# Patient Record
Sex: Male | Born: 1935 | Race: Black or African American | Hispanic: No | Smoking: Former smoker
Health system: Southern US, Community
[De-identification: ages and names within clinical notes are randomized; demographics above are authoritative.]

## PROBLEM LIST (undated history)

## (undated) DIAGNOSIS — Z960 Presence of urogenital implants: Secondary | ICD-10-CM

## (undated) DIAGNOSIS — I219 Acute myocardial infarction, unspecified: Secondary | ICD-10-CM

## (undated) DIAGNOSIS — I10 Essential (primary) hypertension: Secondary | ICD-10-CM

## (undated) DIAGNOSIS — E7800 Pure hypercholesterolemia, unspecified: Secondary | ICD-10-CM

## (undated) DIAGNOSIS — N400 Enlarged prostate without lower urinary tract symptoms: Secondary | ICD-10-CM

## (undated) DIAGNOSIS — H547 Unspecified visual loss: Secondary | ICD-10-CM

## (undated) DIAGNOSIS — I519 Heart disease, unspecified: Secondary | ICD-10-CM

## (undated) DIAGNOSIS — R569 Unspecified convulsions: Secondary | ICD-10-CM

## (undated) DIAGNOSIS — I214 Non-ST elevation (NSTEMI) myocardial infarction: Secondary | ICD-10-CM

## (undated) DIAGNOSIS — J449 Chronic obstructive pulmonary disease, unspecified: Secondary | ICD-10-CM

## (undated) DIAGNOSIS — I209 Angina pectoris, unspecified: Secondary | ICD-10-CM

## (undated) DIAGNOSIS — IMO0001 Reserved for inherently not codable concepts without codable children: Secondary | ICD-10-CM

## (undated) DIAGNOSIS — D649 Anemia, unspecified: Secondary | ICD-10-CM

## (undated) DIAGNOSIS — Z5189 Encounter for other specified aftercare: Secondary | ICD-10-CM

## (undated) DIAGNOSIS — I2510 Atherosclerotic heart disease of native coronary artery without angina pectoris: Secondary | ICD-10-CM

## (undated) HISTORY — DX: Essential (primary) hypertension: I10

## (undated) HISTORY — DX: Anemia, unspecified: D64.9

## (undated) HISTORY — DX: Chronic obstructive pulmonary disease, unspecified: J44.9

## (undated) HISTORY — DX: Unspecified visual loss: H54.7

## (undated) HISTORY — DX: Acute myocardial infarction, unspecified: I21.9

## (undated) HISTORY — PX: CATARACT EXTRACTION: SUR2

## (undated) HISTORY — DX: Non-ST elevation (NSTEMI) myocardial infarction: I21.4

## (undated) HISTORY — PX: TONSILLECTOMY: SUR1361

## (undated) HISTORY — DX: Heart disease, unspecified: I51.9

---

## 1997-10-24 ENCOUNTER — Emergency Department (HOSPITAL_COMMUNITY): Admission: EM | Admit: 1997-10-24 | Discharge: 1997-10-24 | Payer: Self-pay | Admitting: Emergency Medicine

## 1997-11-04 ENCOUNTER — Observation Stay (HOSPITAL_COMMUNITY): Admission: EM | Admit: 1997-11-04 | Discharge: 1997-11-05 | Payer: Self-pay | Admitting: Emergency Medicine

## 2002-10-27 ENCOUNTER — Emergency Department (HOSPITAL_COMMUNITY): Admission: EM | Admit: 2002-10-27 | Discharge: 2002-10-27 | Payer: Self-pay | Admitting: Emergency Medicine

## 2005-04-26 ENCOUNTER — Inpatient Hospital Stay (HOSPITAL_COMMUNITY): Admission: EM | Admit: 2005-04-26 | Discharge: 2005-05-05 | Payer: Self-pay | Admitting: Emergency Medicine

## 2007-08-11 ENCOUNTER — Emergency Department (HOSPITAL_COMMUNITY): Admission: EM | Admit: 2007-08-11 | Discharge: 2007-08-11 | Payer: Self-pay | Admitting: Emergency Medicine

## 2009-03-03 DIAGNOSIS — I219 Acute myocardial infarction, unspecified: Secondary | ICD-10-CM

## 2009-03-03 HISTORY — DX: Acute myocardial infarction, unspecified: I21.9

## 2009-03-03 HISTORY — PX: CORONARY ANGIOPLASTY WITH STENT PLACEMENT: SHX49

## 2009-03-14 ENCOUNTER — Inpatient Hospital Stay (HOSPITAL_COMMUNITY): Admission: EM | Admit: 2009-03-14 | Discharge: 2009-03-19 | Payer: Self-pay | Admitting: Emergency Medicine

## 2009-03-16 ENCOUNTER — Encounter: Payer: Self-pay | Admitting: Cardiology

## 2009-04-16 ENCOUNTER — Emergency Department (HOSPITAL_COMMUNITY): Admission: EM | Admit: 2009-04-16 | Discharge: 2009-04-17 | Payer: Self-pay | Admitting: Emergency Medicine

## 2009-05-23 ENCOUNTER — Emergency Department (HOSPITAL_COMMUNITY): Admission: EM | Admit: 2009-05-23 | Discharge: 2009-05-24 | Payer: Self-pay | Admitting: Emergency Medicine

## 2009-08-28 ENCOUNTER — Emergency Department (HOSPITAL_COMMUNITY): Admission: EM | Admit: 2009-08-28 | Discharge: 2009-08-28 | Payer: Self-pay | Admitting: Emergency Medicine

## 2009-11-22 ENCOUNTER — Ambulatory Visit: Payer: Self-pay | Admitting: Cardiology

## 2010-01-14 ENCOUNTER — Ambulatory Visit: Payer: Self-pay | Admitting: Cardiology

## 2010-02-01 DIAGNOSIS — I214 Non-ST elevation (NSTEMI) myocardial infarction: Secondary | ICD-10-CM

## 2010-02-01 HISTORY — DX: Non-ST elevation (NSTEMI) myocardial infarction: I21.4

## 2010-02-01 HISTORY — PX: CARDIAC CATHETERIZATION: SHX172

## 2010-02-09 ENCOUNTER — Ambulatory Visit: Payer: Self-pay | Admitting: Cardiology

## 2010-02-09 ENCOUNTER — Inpatient Hospital Stay (HOSPITAL_COMMUNITY): Admission: EM | Admit: 2010-02-09 | Discharge: 2010-02-12 | Payer: Self-pay | Admitting: Emergency Medicine

## 2010-02-10 ENCOUNTER — Encounter: Payer: Self-pay | Admitting: Internal Medicine

## 2010-03-02 ENCOUNTER — Ambulatory Visit: Payer: Self-pay | Admitting: Cardiology

## 2010-03-10 ENCOUNTER — Encounter (HOSPITAL_COMMUNITY)
Admission: RE | Admit: 2010-03-10 | Discharge: 2010-05-03 | Payer: Self-pay | Source: Home / Self Care | Attending: Cardiology | Admitting: Cardiology

## 2010-04-06 ENCOUNTER — Ambulatory Visit: Payer: Self-pay | Admitting: Cardiology

## 2010-05-04 ENCOUNTER — Encounter (HOSPITAL_COMMUNITY): Payer: Medicare Other | Attending: Cardiology

## 2010-05-04 DIAGNOSIS — H548 Legal blindness, as defined in USA: Secondary | ICD-10-CM | POA: Insufficient documentation

## 2010-05-04 DIAGNOSIS — I5022 Chronic systolic (congestive) heart failure: Secondary | ICD-10-CM | POA: Insufficient documentation

## 2010-05-04 DIAGNOSIS — Z5189 Encounter for other specified aftercare: Secondary | ICD-10-CM | POA: Insufficient documentation

## 2010-05-04 DIAGNOSIS — I252 Old myocardial infarction: Secondary | ICD-10-CM | POA: Insufficient documentation

## 2010-05-04 DIAGNOSIS — I2510 Atherosclerotic heart disease of native coronary artery without angina pectoris: Secondary | ICD-10-CM | POA: Insufficient documentation

## 2010-05-04 DIAGNOSIS — G40909 Epilepsy, unspecified, not intractable, without status epilepticus: Secondary | ICD-10-CM | POA: Insufficient documentation

## 2010-05-04 DIAGNOSIS — I214 Non-ST elevation (NSTEMI) myocardial infarction: Secondary | ICD-10-CM | POA: Insufficient documentation

## 2010-05-04 DIAGNOSIS — Z7982 Long term (current) use of aspirin: Secondary | ICD-10-CM | POA: Insufficient documentation

## 2010-05-04 DIAGNOSIS — I2589 Other forms of chronic ischemic heart disease: Secondary | ICD-10-CM | POA: Insufficient documentation

## 2010-05-06 ENCOUNTER — Encounter (HOSPITAL_COMMUNITY): Payer: Medicare Other

## 2010-05-09 ENCOUNTER — Encounter (HOSPITAL_COMMUNITY): Payer: Medicare Other

## 2010-05-11 ENCOUNTER — Encounter (HOSPITAL_COMMUNITY): Payer: Medicare Other

## 2010-05-13 ENCOUNTER — Encounter (HOSPITAL_COMMUNITY): Payer: Medicare Other

## 2010-05-16 ENCOUNTER — Encounter (HOSPITAL_COMMUNITY): Payer: Medicare Other

## 2010-05-18 ENCOUNTER — Encounter (HOSPITAL_COMMUNITY): Payer: Medicare Other

## 2010-05-20 ENCOUNTER — Encounter (HOSPITAL_COMMUNITY): Payer: Medicare Other

## 2010-05-23 ENCOUNTER — Encounter (HOSPITAL_COMMUNITY): Payer: Medicare Other

## 2010-05-24 ENCOUNTER — Ambulatory Visit (INDEPENDENT_AMBULATORY_CARE_PROVIDER_SITE_OTHER): Payer: Medicare Other | Admitting: *Deleted

## 2010-05-24 DIAGNOSIS — I2510 Atherosclerotic heart disease of native coronary artery without angina pectoris: Secondary | ICD-10-CM

## 2010-05-24 DIAGNOSIS — Z7901 Long term (current) use of anticoagulants: Secondary | ICD-10-CM

## 2010-05-25 ENCOUNTER — Encounter (HOSPITAL_COMMUNITY): Payer: Medicare Other

## 2010-05-27 ENCOUNTER — Encounter (HOSPITAL_COMMUNITY): Payer: Medicare Other

## 2010-05-30 ENCOUNTER — Encounter (HOSPITAL_COMMUNITY): Payer: Medicare Other

## 2010-06-01 ENCOUNTER — Encounter (HOSPITAL_COMMUNITY): Payer: Medicare Other

## 2010-06-03 ENCOUNTER — Encounter (HOSPITAL_COMMUNITY): Payer: Medicare Other | Attending: Cardiology

## 2010-06-03 DIAGNOSIS — I2510 Atherosclerotic heart disease of native coronary artery without angina pectoris: Secondary | ICD-10-CM | POA: Insufficient documentation

## 2010-06-03 DIAGNOSIS — I252 Old myocardial infarction: Secondary | ICD-10-CM | POA: Insufficient documentation

## 2010-06-03 DIAGNOSIS — I214 Non-ST elevation (NSTEMI) myocardial infarction: Secondary | ICD-10-CM | POA: Insufficient documentation

## 2010-06-03 DIAGNOSIS — Z7982 Long term (current) use of aspirin: Secondary | ICD-10-CM | POA: Insufficient documentation

## 2010-06-03 DIAGNOSIS — I2589 Other forms of chronic ischemic heart disease: Secondary | ICD-10-CM | POA: Insufficient documentation

## 2010-06-03 DIAGNOSIS — Z5189 Encounter for other specified aftercare: Secondary | ICD-10-CM | POA: Insufficient documentation

## 2010-06-03 DIAGNOSIS — G40909 Epilepsy, unspecified, not intractable, without status epilepticus: Secondary | ICD-10-CM | POA: Insufficient documentation

## 2010-06-03 DIAGNOSIS — I5022 Chronic systolic (congestive) heart failure: Secondary | ICD-10-CM | POA: Insufficient documentation

## 2010-06-03 DIAGNOSIS — H548 Legal blindness, as defined in USA: Secondary | ICD-10-CM | POA: Insufficient documentation

## 2010-06-06 ENCOUNTER — Encounter (HOSPITAL_COMMUNITY): Payer: Self-pay | Attending: Cardiology

## 2010-06-06 ENCOUNTER — Encounter (HOSPITAL_COMMUNITY): Payer: Medicare Other

## 2010-06-06 DIAGNOSIS — Z7982 Long term (current) use of aspirin: Secondary | ICD-10-CM | POA: Insufficient documentation

## 2010-06-06 DIAGNOSIS — I214 Non-ST elevation (NSTEMI) myocardial infarction: Secondary | ICD-10-CM | POA: Insufficient documentation

## 2010-06-06 DIAGNOSIS — I5022 Chronic systolic (congestive) heart failure: Secondary | ICD-10-CM | POA: Insufficient documentation

## 2010-06-06 DIAGNOSIS — Z5189 Encounter for other specified aftercare: Secondary | ICD-10-CM | POA: Insufficient documentation

## 2010-06-06 DIAGNOSIS — I2589 Other forms of chronic ischemic heart disease: Secondary | ICD-10-CM | POA: Insufficient documentation

## 2010-06-06 DIAGNOSIS — I2510 Atherosclerotic heart disease of native coronary artery without angina pectoris: Secondary | ICD-10-CM | POA: Insufficient documentation

## 2010-06-06 DIAGNOSIS — I252 Old myocardial infarction: Secondary | ICD-10-CM | POA: Insufficient documentation

## 2010-06-06 DIAGNOSIS — H548 Legal blindness, as defined in USA: Secondary | ICD-10-CM | POA: Insufficient documentation

## 2010-06-06 DIAGNOSIS — G40909 Epilepsy, unspecified, not intractable, without status epilepticus: Secondary | ICD-10-CM | POA: Insufficient documentation

## 2010-06-08 ENCOUNTER — Encounter (HOSPITAL_COMMUNITY): Payer: Self-pay

## 2010-06-08 ENCOUNTER — Encounter (HOSPITAL_COMMUNITY): Payer: Medicare Other

## 2010-06-10 ENCOUNTER — Encounter (HOSPITAL_COMMUNITY): Payer: Medicare Other

## 2010-06-10 ENCOUNTER — Encounter (HOSPITAL_COMMUNITY): Payer: Self-pay

## 2010-06-13 ENCOUNTER — Encounter (HOSPITAL_COMMUNITY): Payer: Medicare Other

## 2010-06-13 ENCOUNTER — Encounter (HOSPITAL_COMMUNITY): Payer: Self-pay

## 2010-06-14 LAB — DIFFERENTIAL
Basophils Absolute: 0.1 10*3/uL (ref 0.0–0.1)
Basophils Relative: 1 % (ref 0–1)
Eosinophils Relative: 3 % (ref 0–5)
Lymphocytes Relative: 13 % (ref 12–46)
Monocytes Absolute: 0.8 10*3/uL (ref 0.1–1.0)

## 2010-06-14 LAB — CBC
HCT: 35.9 % — ABNORMAL LOW (ref 39.0–52.0)
HCT: 37.2 % — ABNORMAL LOW (ref 39.0–52.0)
HCT: 37.4 % — ABNORMAL LOW (ref 39.0–52.0)
Hemoglobin: 12 g/dL — ABNORMAL LOW (ref 13.0–17.0)
Hemoglobin: 12.2 g/dL — ABNORMAL LOW (ref 13.0–17.0)
Hemoglobin: 12.4 g/dL — ABNORMAL LOW (ref 13.0–17.0)
MCH: 28.6 pg (ref 26.0–34.0)
MCH: 29.2 pg (ref 26.0–34.0)
MCH: 29.4 pg (ref 26.0–34.0)
MCHC: 32.8 g/dL (ref 30.0–36.0)
MCV: 87.7 fL (ref 78.0–100.0)
MCV: 88 fL (ref 78.0–100.0)
MCV: 88.2 fL (ref 78.0–100.0)
Platelets: 266 10*3/uL (ref 150–400)
Platelets: 271 10*3/uL (ref 150–400)
RBC: 4.07 MIL/uL — ABNORMAL LOW (ref 4.22–5.81)
RBC: 4.22 MIL/uL (ref 4.22–5.81)
RBC: 4.25 MIL/uL (ref 4.22–5.81)
RBC: 4.27 MIL/uL (ref 4.22–5.81)
RDW: 13.4 % (ref 11.5–15.5)
RDW: 13.5 % (ref 11.5–15.5)
WBC: 10 10*3/uL (ref 4.0–10.5)
WBC: 8.3 10*3/uL (ref 4.0–10.5)

## 2010-06-14 LAB — PROTIME-INR
INR: 1.02 (ref 0.00–1.49)
Prothrombin Time: 13.6 seconds (ref 11.6–15.2)

## 2010-06-14 LAB — CARDIAC PANEL(CRET KIN+CKTOT+MB+TROPI)
CK, MB: 14 ng/mL (ref 0.3–4.0)
Relative Index: 10.1 — ABNORMAL HIGH (ref 0.0–2.5)
Total CK: 138 U/L (ref 7–232)
Total CK: 154 U/L (ref 7–232)
Troponin I: 2.32 ng/mL (ref 0.00–0.06)
Troponin I: 4.11 ng/mL (ref 0.00–0.06)
Troponin I: 5.41 ng/mL (ref 0.00–0.06)

## 2010-06-14 LAB — PHENYTOIN LEVEL, TOTAL: Phenytoin Lvl: <2.5 ug/mL — ABNORMAL LOW (ref 10.0–20.0)

## 2010-06-14 LAB — COMPREHENSIVE METABOLIC PANEL
AST: 26 U/L (ref 0–37)
Albumin: 3.8 g/dL (ref 3.5–5.2)
Alkaline Phosphatase: 66 U/L (ref 39–117)
Alkaline Phosphatase: 72 U/L (ref 39–117)
BUN: 12 mg/dL (ref 6–23)
BUN: 9 mg/dL (ref 6–23)
CO2: 21 mEq/L (ref 19–32)
CO2: 25 mEq/L (ref 19–32)
Chloride: 104 mEq/L (ref 96–112)
Chloride: 111 mEq/L (ref 96–112)
Creatinine, Ser: 1.13 mg/dL (ref 0.4–1.5)
GFR calc Af Amer: >60 mL/min (ref >60)
GFR calc non Af Amer: >60 mL/min (ref >60)
GFR calc non Af Amer: >60 mL/min (ref >60)
Glucose, Bld: 81 mg/dL (ref 70–99)
Potassium: 3.8 mEq/L (ref 3.5–5.1)
Potassium: 4 mEq/L (ref 3.5–5.1)
Total Bilirubin: 0.5 mg/dL (ref 0.3–1.2)
Total Bilirubin: 0.5 mg/dL (ref 0.3–1.2)

## 2010-06-14 LAB — BASIC METABOLIC PANEL
CO2: 25 mEq/L (ref 19–32)
GFR calc Af Amer: >60 mL/min (ref >60)
Glucose, Bld: 126 mg/dL — ABNORMAL HIGH (ref 70–99)
Potassium: 4.5 mEq/L (ref 3.5–5.1)
Sodium: 136 mEq/L (ref 135–145)

## 2010-06-14 LAB — LIPID PANEL: Cholesterol: 139 mg/dL (ref 0–200)

## 2010-06-14 LAB — TROPONIN I: Troponin I: 0.02 ng/mL (ref 0.00–0.06)

## 2010-06-14 LAB — HEPARIN LEVEL (UNFRACTIONATED): Heparin Unfractionated: 0.47 IU/mL (ref 0.30–0.70)

## 2010-06-14 LAB — TSH: TSH: 0.688 u[IU]/mL (ref 0.350–4.500)

## 2010-06-14 LAB — CK TOTAL AND CKMB (NOT AT ARMC): CK, MB: 3.8 ng/mL (ref 0.3–4.0)

## 2010-06-14 LAB — POCT CARDIAC MARKERS

## 2010-06-15 ENCOUNTER — Encounter (HOSPITAL_COMMUNITY): Payer: Self-pay

## 2010-06-15 ENCOUNTER — Encounter (HOSPITAL_COMMUNITY): Payer: Medicare Other

## 2010-06-17 ENCOUNTER — Encounter (HOSPITAL_COMMUNITY): Payer: Self-pay

## 2010-06-17 ENCOUNTER — Encounter (HOSPITAL_COMMUNITY): Payer: Medicare Other

## 2010-06-20 ENCOUNTER — Encounter (HOSPITAL_COMMUNITY): Payer: Medicare Other

## 2010-06-20 ENCOUNTER — Encounter (HOSPITAL_COMMUNITY): Payer: Self-pay

## 2010-06-20 LAB — URINE CULTURE: Colony Count: >=100000

## 2010-06-22 ENCOUNTER — Encounter (HOSPITAL_COMMUNITY): Payer: Medicare Other

## 2010-06-22 ENCOUNTER — Encounter (HOSPITAL_COMMUNITY): Payer: Self-pay

## 2010-06-22 LAB — URINALYSIS, ROUTINE W REFLEX MICROSCOPIC
Ketones, ur: 40 mg/dL — AB
Nitrite: POSITIVE — AB
Urobilinogen, UA: 1 mg/dL (ref 0.0–1.0)
pH: 6.5 (ref 5.0–8.0)

## 2010-06-22 LAB — CBC
Hemoglobin: 10.2 g/dL — ABNORMAL LOW (ref 13.0–17.0)
MCHC: 33.2 g/dL (ref 30.0–36.0)
RBC: 3.32 MIL/uL — ABNORMAL LOW (ref 4.22–5.81)
WBC: 7 10*3/uL (ref 4.0–10.5)

## 2010-06-22 LAB — URINE CULTURE: Colony Count: 75000

## 2010-06-22 LAB — BASIC METABOLIC PANEL
Calcium: 8.8 mg/dL (ref 8.4–10.5)
Creatinine, Ser: 1.05 mg/dL (ref 0.4–1.5)
GFR calc Af Amer: >60 mL/min (ref >60)
GFR calc non Af Amer: >60 mL/min (ref >60)
Sodium: 140 mEq/L (ref 135–145)

## 2010-06-22 LAB — APTT: aPTT: 36 seconds (ref 24–37)

## 2010-06-22 LAB — PROTIME-INR
INR: 1.12 (ref 0.00–1.49)
Prothrombin Time: 14.3 seconds (ref 11.6–15.2)

## 2010-06-22 LAB — URINE MICROSCOPIC-ADD ON

## 2010-06-24 ENCOUNTER — Encounter (HOSPITAL_COMMUNITY): Payer: Medicare Other

## 2010-06-24 ENCOUNTER — Encounter (HOSPITAL_COMMUNITY): Payer: Self-pay

## 2010-06-27 ENCOUNTER — Encounter (HOSPITAL_COMMUNITY): Payer: Self-pay

## 2010-06-27 ENCOUNTER — Encounter (HOSPITAL_COMMUNITY): Payer: Medicare Other

## 2010-06-29 ENCOUNTER — Encounter (HOSPITAL_COMMUNITY): Payer: Self-pay

## 2010-06-29 ENCOUNTER — Encounter (HOSPITAL_COMMUNITY): Payer: Medicare Other

## 2010-07-01 ENCOUNTER — Encounter (HOSPITAL_COMMUNITY): Payer: Medicare Other

## 2010-07-01 ENCOUNTER — Encounter (HOSPITAL_COMMUNITY): Payer: Self-pay

## 2010-07-04 ENCOUNTER — Encounter (HOSPITAL_COMMUNITY): Payer: Medicare Other

## 2010-07-04 ENCOUNTER — Encounter (HOSPITAL_COMMUNITY): Payer: Medicare Other | Attending: Cardiology

## 2010-07-04 DIAGNOSIS — G40909 Epilepsy, unspecified, not intractable, without status epilepticus: Secondary | ICD-10-CM | POA: Insufficient documentation

## 2010-07-04 DIAGNOSIS — Z7982 Long term (current) use of aspirin: Secondary | ICD-10-CM | POA: Insufficient documentation

## 2010-07-04 DIAGNOSIS — I2589 Other forms of chronic ischemic heart disease: Secondary | ICD-10-CM | POA: Insufficient documentation

## 2010-07-04 DIAGNOSIS — I252 Old myocardial infarction: Secondary | ICD-10-CM | POA: Insufficient documentation

## 2010-07-04 DIAGNOSIS — I5022 Chronic systolic (congestive) heart failure: Secondary | ICD-10-CM | POA: Insufficient documentation

## 2010-07-04 DIAGNOSIS — I2510 Atherosclerotic heart disease of native coronary artery without angina pectoris: Secondary | ICD-10-CM | POA: Insufficient documentation

## 2010-07-04 DIAGNOSIS — I214 Non-ST elevation (NSTEMI) myocardial infarction: Secondary | ICD-10-CM | POA: Insufficient documentation

## 2010-07-04 DIAGNOSIS — Z5189 Encounter for other specified aftercare: Secondary | ICD-10-CM | POA: Insufficient documentation

## 2010-07-04 DIAGNOSIS — H548 Legal blindness, as defined in USA: Secondary | ICD-10-CM | POA: Insufficient documentation

## 2010-07-05 LAB — COMPREHENSIVE METABOLIC PANEL
ALT: 15 U/L (ref 0–53)
Albumin: 3.6 g/dL (ref 3.5–5.2)
BUN: 16 mg/dL (ref 6–23)
Calcium: 8.6 mg/dL (ref 8.4–10.5)
Glucose, Bld: 134 mg/dL — ABNORMAL HIGH (ref 70–99)
Potassium: 3.2 mEq/L — ABNORMAL LOW (ref 3.5–5.1)
Sodium: 140 mEq/L (ref 135–145)
Total Protein: 7 g/dL (ref 6.0–8.3)

## 2010-07-05 LAB — LIPID PANEL
HDL: 35 mg/dL — ABNORMAL LOW (ref >39)
Total CHOL/HDL Ratio: 5.9 RATIO
Triglycerides: 80 mg/dL (ref <150)

## 2010-07-05 LAB — BASIC METABOLIC PANEL
BUN: 17 mg/dL (ref 6–23)
BUN: 20 mg/dL (ref 6–23)
BUN: 20 mg/dL (ref 6–23)
CO2: 22 mEq/L (ref 19–32)
CO2: 25 mEq/L (ref 19–32)
Calcium: 8.2 mg/dL — ABNORMAL LOW (ref 8.4–10.5)
Chloride: 103 mEq/L (ref 96–112)
Chloride: 108 mEq/L (ref 96–112)
GFR calc non Af Amer: >60 mL/min (ref >60)
Glucose, Bld: 123 mg/dL — ABNORMAL HIGH (ref 70–99)
Glucose, Bld: 136 mg/dL — ABNORMAL HIGH (ref 70–99)
Glucose, Bld: 93 mg/dL (ref 70–99)
Potassium: 3.6 mEq/L (ref 3.5–5.1)
Potassium: 4.1 mEq/L (ref 3.5–5.1)
Potassium: 4.2 mEq/L (ref 3.5–5.1)
Sodium: 134 mEq/L — ABNORMAL LOW (ref 135–145)

## 2010-07-05 LAB — CBC
HCT: 26.3 % — ABNORMAL LOW (ref 39.0–52.0)
HCT: 26.6 % — ABNORMAL LOW (ref 39.0–52.0)
HCT: 28 % — ABNORMAL LOW (ref 39.0–52.0)
HCT: 28.8 % — ABNORMAL LOW (ref 39.0–52.0)
HCT: 28.9 % — ABNORMAL LOW (ref 39.0–52.0)
HCT: 32.1 % — ABNORMAL LOW (ref 39.0–52.0)
Hemoglobin: 10.6 g/dL — ABNORMAL LOW (ref 13.0–17.0)
Hemoglobin: 12.1 g/dL — ABNORMAL LOW (ref 13.0–17.0)
Hemoglobin: 9.5 g/dL — ABNORMAL LOW (ref 13.0–17.0)
MCHC: 32.7 g/dL (ref 30.0–36.0)
MCHC: 32.8 g/dL (ref 30.0–36.0)
MCHC: 33 g/dL (ref 30.0–36.0)
MCV: 91.9 fL (ref 78.0–100.0)
MCV: 92.4 fL (ref 78.0–100.0)
MCV: 92.8 fL (ref 78.0–100.0)
Platelets: 198 10*3/uL (ref 150–400)
Platelets: 201 10*3/uL (ref 150–400)
Platelets: 204 10*3/uL (ref 150–400)
Platelets: 221 10*3/uL (ref 150–400)
Platelets: 251 10*3/uL (ref 150–400)
RBC: 3.12 MIL/uL — ABNORMAL LOW (ref 4.22–5.81)
RBC: 3.12 MIL/uL — ABNORMAL LOW (ref 4.22–5.81)
RBC: 4.01 MIL/uL — ABNORMAL LOW (ref 4.22–5.81)
RDW: 12.8 % (ref 11.5–15.5)
RDW: 12.9 % (ref 11.5–15.5)
RDW: 12.9 % (ref 11.5–15.5)
RDW: 13.1 % (ref 11.5–15.5)
RDW: 13.1 % (ref 11.5–15.5)
WBC: 15.3 10*3/uL — ABNORMAL HIGH (ref 4.0–10.5)
WBC: 18.2 10*3/uL — ABNORMAL HIGH (ref 4.0–10.5)

## 2010-07-05 LAB — CARDIAC PANEL(CRET KIN+CKTOT+MB+TROPI)
CK, MB: 162.4 ng/mL — ABNORMAL HIGH (ref 0.3–4.0)
Relative Index: 12.6 — ABNORMAL HIGH (ref 0.0–2.5)
Troponin I: >100 ng/mL (ref 0.00–0.06)

## 2010-07-05 LAB — TSH: TSH: 0.951 u[IU]/mL (ref 0.350–4.500)

## 2010-07-05 LAB — DIFFERENTIAL
Basophils Absolute: 0 10*3/uL (ref 0.0–0.1)
Basophils Absolute: 0 10*3/uL (ref 0.0–0.1)
Basophils Relative: 0 % (ref 0–1)
Eosinophils Relative: 0 % (ref 0–5)
Lymphocytes Relative: 5 % — ABNORMAL LOW (ref 12–46)
Lymphs Abs: 1 10*3/uL (ref 0.7–4.0)
Lymphs Abs: 1.9 10*3/uL (ref 0.7–4.0)
Monocytes Absolute: 1 10*3/uL (ref 0.1–1.0)
Monocytes Absolute: 1.5 10*3/uL — ABNORMAL HIGH (ref 0.1–1.0)
Monocytes Absolute: 1.6 10*3/uL — ABNORMAL HIGH (ref 0.1–1.0)
Monocytes Relative: 9 % (ref 3–12)
Neutro Abs: 15.7 10*3/uL — ABNORMAL HIGH (ref 1.7–7.7)
Neutro Abs: 7.8 10*3/uL — ABNORMAL HIGH (ref 1.7–7.7)
Neutrophils Relative %: 70 % (ref 43–77)

## 2010-07-05 LAB — POCT I-STAT, CHEM 8
HCT: 39 % (ref 39.0–52.0)
Hemoglobin: 13.3 g/dL (ref 13.0–17.0)
Potassium: 3.2 mEq/L — ABNORMAL LOW (ref 3.5–5.1)
Sodium: 142 mEq/L (ref 135–145)
TCO2: 26 mmol/L (ref 0–100)

## 2010-07-05 LAB — CK TOTAL AND CKMB (NOT AT ARMC)
CK, MB: 2.1 ng/mL (ref 0.3–4.0)
Relative Index: INVALID (ref 0.0–2.5)
Total CK: 84 U/L (ref 7–232)

## 2010-07-05 LAB — PROTIME-INR
INR: 1.09 (ref 0.00–1.49)
Prothrombin Time: 14 seconds (ref 11.6–15.2)

## 2010-07-05 LAB — POCT CARDIAC MARKERS: Troponin i, poc: <0.05 ng/mL (ref 0.00–0.09)

## 2010-07-06 ENCOUNTER — Encounter (HOSPITAL_COMMUNITY): Payer: Medicare Other

## 2010-07-08 ENCOUNTER — Encounter (HOSPITAL_COMMUNITY): Payer: Medicare Other

## 2010-07-11 ENCOUNTER — Encounter (HOSPITAL_COMMUNITY): Payer: Medicare Other

## 2010-07-13 ENCOUNTER — Encounter (HOSPITAL_COMMUNITY): Payer: Medicare Other

## 2010-07-15 ENCOUNTER — Encounter (HOSPITAL_COMMUNITY): Payer: Medicare Other

## 2010-07-18 ENCOUNTER — Encounter (HOSPITAL_COMMUNITY): Payer: Medicare Other

## 2010-07-20 ENCOUNTER — Encounter (HOSPITAL_COMMUNITY): Payer: Medicare Other

## 2010-07-22 ENCOUNTER — Encounter (HOSPITAL_COMMUNITY): Payer: Medicare Other

## 2010-07-25 ENCOUNTER — Encounter (HOSPITAL_COMMUNITY): Payer: Medicare Other

## 2010-07-27 ENCOUNTER — Encounter (HOSPITAL_COMMUNITY): Payer: Medicare Other

## 2010-07-29 ENCOUNTER — Encounter (HOSPITAL_COMMUNITY): Payer: Medicare Other

## 2010-08-01 ENCOUNTER — Encounter (HOSPITAL_COMMUNITY): Payer: Medicare Other

## 2010-08-03 ENCOUNTER — Encounter (HOSPITAL_COMMUNITY): Payer: PRIVATE HEALTH INSURANCE

## 2010-08-05 ENCOUNTER — Encounter (HOSPITAL_COMMUNITY): Payer: PRIVATE HEALTH INSURANCE

## 2010-08-08 ENCOUNTER — Encounter (HOSPITAL_COMMUNITY): Payer: PRIVATE HEALTH INSURANCE

## 2010-08-10 ENCOUNTER — Encounter (HOSPITAL_COMMUNITY): Payer: PRIVATE HEALTH INSURANCE

## 2010-08-12 ENCOUNTER — Encounter (HOSPITAL_COMMUNITY): Payer: PRIVATE HEALTH INSURANCE

## 2010-08-15 ENCOUNTER — Encounter (HOSPITAL_COMMUNITY): Payer: PRIVATE HEALTH INSURANCE

## 2010-08-17 ENCOUNTER — Encounter (HOSPITAL_COMMUNITY): Payer: PRIVATE HEALTH INSURANCE

## 2010-08-18 ENCOUNTER — Other Ambulatory Visit (HOSPITAL_COMMUNITY): Payer: Self-pay | Admitting: Cardiology

## 2010-08-18 DIAGNOSIS — I2510 Atherosclerotic heart disease of native coronary artery without angina pectoris: Secondary | ICD-10-CM

## 2010-08-19 ENCOUNTER — Encounter (HOSPITAL_COMMUNITY): Payer: PRIVATE HEALTH INSURANCE

## 2010-08-19 NOTE — H&P (Signed)
NAME:  [name], [name] NO.:  [id number]   MEDICAL RECORD NO.:  [id number]          PATIENT TYPE:  EMS   LOCATION:  MAJO                         FACILITY:  [facility]   PHYSICIAN:  Geoffry Paradise, M.D.  DATE OF BIRTH:  1933-02-16   DATE OF ADMISSION:  04/26/2005  DATE OF DISCHARGE:                                HISTORY & PHYSICAL   CHIEF COMPLAINT:  Seizures and altered mental status.   HISTORY OF PRESENT ILLNESS:  [name] is a 77 year old gentleman with a  history of hypertension, evidently not seen by his primary/my partner for at  least one year according to stepdaughter. [name] is presenting at this time with  altered mental status and seizures. Unfortunately, the history is difficult  to ferret out as the stroke service has already seen him, family is not  readily available, [name] has already received significant Ativan and Dilantin.  So needless to say, very little history is obtainable. [name] is agitated now  coming out of the MRI and I am able to piece together some semblance of  history from the neurology note, his pastor and some distant family. [name]  evidently at baseline is legally blind but fairly active and actually does a  fair amount of volunteer work with the [employer] in [place]. According to one of the family members, [name] does smoke fairly heavily  and they are not even clear that [name] is taking his antihypertensive  medication and what that is. Regardless, [name] has been quite healthy and  active at baseline until a sudden neurologic change some time this afternoon  or early evening at which time [name] became confused, bouncing off walls and  developed some left hemibody jerking. [name] was seen by Dr. Pearlean Brownie, stroke  service, immediately; at which time, [name] evidently witnessed some focal left  body seizures which were treated with the Ativan and subsequently Dilantin  loaded by pharmacy protocol. [name] has had no prior CNS history and certainly  had no  illness leading up to this. [name] has had no fever, no rash, no chest  pain or shortness of breath. [name] has had no nausea or vomiting and has had no  evidence of incontinence. At the time of my evaluation, [name] is just coming  back from the MRI and while somewhat refocusable clearly is agitated and  distractible and difficult to comply with history or exam.   PAST MEDICAL HISTORY:  No known drug allergies.   MEDICATIONS:  Blood pressure medications unknown type.   MEDICAL ILLNESSES:  Hypertension for 20 years.   SURGICAL ILLNESSES:  None according to available historians.   SOCIAL HISTORY:  [name] lives in [place], is married. Stepdaughter is the  best historian present. Smokes fairly heavily. Does not drink alcohol.   PHYSICAL EXAMINATION:  VITAL SIGNS:  Temperature 98.0, blood pressure  108/56, pulse 84, respiratory rate 20. O2 saturation 100%.  GENERAL:  The patient is agitated, restless on the bed, spontaneously moving  extremities x4. Seems to vocalize and know his pastor and can converse with  him.  HEENT:  Anicteric.  Bitemporal wasting. No facial droop. Difficult to discern  whether there is a gaze preference or  not because of his current  restlessness and agitation and [name] will not open his eyes or comply with  this. Oropharynx is grossly benign. No obvious blood or trauma.  NECK:  No JVD or bruits.  LUNGS:  Clear bilaterally.  CARDIOVASCULAR:  Distant, obscured by airway sounds and cooperation. Seems  to have a regular rate and rhythm. No obvious murmur.  ABDOMEN:  Soft, flat and nontender. No ascites. No hepatosplenomegaly.  EXTREMITIES:  No clubbing, cyanosis, or edema. Calf soft and nontender.  Intact distal pulses. No mottling.  NEUROLOGICAL:  [name] is lethargic, restless, moves extremities x4, will  converse minimally, will not follow commands for me but [name] has been given  multiple medications at this point.   CT reveals low density lesion right basal ganglia, questionable  infarct  versus tumor. MRI pending.   IMPRESSION:  1.  Left hemibody seizures with altered mental status suspicious for right      brain stroke versus tumor, workup under way. To be treated with      benzodiazepines, Dilantin and support.  2.  Hypertension:  Clearly not an issue at this time. History not clear with      regards to this. Further pending his course.   PLAN:  I have discussed it at length with friends and family that are  present and the workup that will ensue. [name] will be admitted to the intensive  care unit for monitoring of his airway, treatment of seizures, hydration and  further pending his course.           ______________________________  Geoffry Paradise, M.D.     RA/MEDQ  D:  04/26/2005  T:  04/26/2005  Job:  [id number]

## 2010-08-19 NOTE — Discharge Summary (Signed)
[name], [name]               ACCOUNT NO.:  [id number]   MEDICAL RECORD NO.:  [id number]          PATIENT TYPE:  INP   LOCATION:  5522                         FACILITY:  [facility]   PHYSICIAN:  Larina Earthly, M.D.        DATE OF BIRTH:  03/02/1933   DATE OF ADMISSION:  04/26/2005  DATE OF DISCHARGE:  05/05/2005                                 DISCHARGE SUMMARY   DISCHARGE DIAGNOSES:  1.  Uncontrolled hypertension.  2.  Aspiration pneumonia.  3.  Seizure disorder of unclear etiology with MRI being unremarkable.  Seen      and evaluated by the code stroke team with question of malignant      hypertension causing the patient's seizure disorder.  4.  Physician deconditioning.  5.  Decreased visual acuity.  6.  Tobacco abuse with a nicotine addiction.   DISCHARGE MEDICATIONS:  1.  Norvasc 10 mg q.p.m.  2.  Dilantin 100 mg capsules 2 tabs twice daily.  3.  Avelox 400 mg each day x5 days.  4.  Coated aspirin 325 mg each day.  5.  Benicar/HCT 40/25 1 each morning.   Patient is to see Dr. Felipa Eth on May 09, 2005, at which time he will need  review of the echocardiogram obtain in the hospital, Dilantin level, and re-  evaluation of his blood pressure medications and re-evaluation of his  electrolytes and renal parameters.  Patient will also need a followup with  [facility] with Dr. Pearlean Brownie or colleague for his newly  diagnosed seizure disorder.  Patient will also be sent home with home health  with physician and occupational therapies as well as RN to evaluate blood  pressure and work on the patient's physical deconditioning and gait  instability, presumably secondary to lower blood pressure in the normal  range.   Laboratory evaluation on May 03, 2005, sodium 135, potassium 3.4, BUN  10, creatinine 1, calcium 9.1, carbon dioxide 27, white blood cell count  7.9, hemoglobin 13.1, hematocrit 39%, platelet count 255.  Other pertinent  lab work, white blood cell  count was elevated at 16.8 on April 27, 2005.  Admission BUN was 12, creatinine 1.1.  Albumin was 4.1, decreased to 3.3.  Liver function tests all were normal.  Homocysteine level was elevated at  21.4.  CK is 103.  CK-MB 2.4.  Troponin I 0.03.  TSH 0.526.  Vitamin B12  359.  Phenytoin level on May 01, 2005 was 8.2; however, this corrected  to a level of 10.7 with a goal being 10-20 as evaluated by pharmacy  protocol.  Dilantin level on April 27, 2005 was 14.4.  Tox screen was  negative.  Urinalysis on admission was consistent with a UTI; however, urine  culture grew out no growth.   RADIOLOGY:  Patient did have a swallowing study by speech therapy, and he  was deemed appropriate for a regular diet with supervision.   Chest x-ray on April 30, 2005 revealed improved aeration and partial  clearing of the right lower lobe process.   Chest x-ray on April 28, 2005 revealed  COPD with patchy right basilar air  space disease that could represent infection or aspiration.   Head CT without contrast on April 26, 2005 revealed no acute intracranial  findings, mild cerebral atrophy, and chronic microvascular white matter  disease.   MRI with contrast on April 26, 2005 revealed no acute intracranial  abnormality with scattered periventricular white matter changes with  extension into the white matter tracts, surrounding the right greater than  left basal ganglia, nonspecific findings but probable sequelae and chronic  microvascular ischemia.  No acute intracranial abnormality or focal mass.  The lesions serve as a seizure focus.  There was a slight elongation of the  right globe, which can be seen in possible congenital glaucoma.   EKG revealed a normal sinus rhythm with a prolonged Q-T and minimal voltage  criteria for a left ventricular hypertrophy.   Echocardiogram is pending, performed on May 04, 2005.   HISTORY OF PRESENT ILLNESS:  This is a 77 year old with a long  history of  medication noncompliance and hypertension that has been uncontrolled.  Indeed, his last office visit with myself was greater than one year ago.  He  does not have a history of seizures; however, he was found on April 26, 2005 with altered mental status and seizure-like activity and was seen and  evaluated by Dr. Pearlean Brownie of the stroke service on an emergent basis.  They  reviewed his CT scan and MRI and did load him with Dilantin and IV  benzodiazepine.  They also felt that he might have a subacute right brain  infarct; however, this was unclear, based on the CT scan.  Again, my  partner, Dr. Jacky Kindle, was called to admit the patient after the stroke team  evaluation.   HOSPITAL COURSE:  Patient was seen on one occasionally by the neurology  service, who felt that MRI revealed no acute infarct, and they thought that  his new-onset focal seizures were secondary to a right subcortical lesion,  the exact etiology uncertain.  They recommended continuing the Dilantin and  repeating the MRI in approximately two months.   The patient was transferred to the floor.  His mental status quickly  resolved back to his baseline, where he was alert and oriented x3 after IV  Ativan had dissipated in its effect.  The patient did have some low-grade  fevers.  Chest x-ray revealed an aspiration pneumonia.  He was treated with  Avelox, but he never developed a significant cough or respiratory distress.  He clinically improved with respect to his fever.  Speech therapy did see  and evaluate him and saw no overt signs or symptoms of aspiration.  He did  undergone a modified barium swallow, and this was fairly unremarkable, but  they did recommend supervision for the time being with his po intake, and  they discontinued speech therapy services.  Occupational and physical  therapy saw the patient and felt that he did need some followup with home health, given his mild gait instability in the setting  of his visual  abnormalities and elevated blood pressure.   The patient's blood pressure was somewhat very difficult to treat on an  outpatient basis.  He was scheduled to receive Tarka 4/240; however, it  appears that he was clearly noncompliant with this medication, and he did  not receive refills.  On a regular basis, his blood pressure was in excess  of 180-90 systolically as well as 100-110 diastolically.  His medications  were slowly increased  to full dose angiotensin receptor blocker, high dose  calcium channel blocker, and diuretics, so that by the day of discharge, his  blood pressure was in the 120s-140s/70-80s.  He was experiencing some  temporary dizziness on occasion; however, was able to ambulate down the hall  with supervision by myself and without falls and felt comfortable going home  with home health assistance at this time.  We did discuss the importance of blood pressure control and the possible  etiology that microvascular ischemia may have as a result of hypertension,  causing his seizure disorder.  He was in agreement, and he stated that he  does live at home with his wife and stepdaughter, who will be able to assist  in his care.      Larina Earthly, M.D.  Electronically Signed     RA/MEDQ  D:  05/05/2005  T:  05/05/2005  Job:  [id number]   cc:   Pramod P. Pearlean Brownie, MD  Fax: [phone]

## 2010-08-19 NOTE — Consult Note (Signed)
NAME:  [name], [name]               ACCOUNT NO.:  [id number]   MEDICAL RECORD NO.:  [id number]          PATIENT TYPE:  EMS   LOCATION:  MAJO                         FACILITY:  [facility]   PHYSICIAN:  Pramod P. Pearlean Brownie, MD    DATE OF BIRTH:  1932/10/13   DATE OF CONSULTATION:  04/26/2005  DATE OF DISCHARGE:                                   CONSULTATION   REFERRING PHYSICIAN:  Bethann Berkshire, M.D.   REASON FOR REFERRAL:  Code Stroke.   HISTORY OF PRESENT ILLNESS:  [name] is a 77 year old African-American  gentleman who was brought to the emergency room by his family for altered  mental status and some left-sided weakness.  The patient was seen by me  emergently when Code Stroke was called at 1950.  The patient was clearly  having some focal seizures with left gaze deviation and jerking of the eyes.  Admission CT scan was obtained immediately and read by me, showing no acute  right brain pathology.  There was an area of low density in the right basal  ganglia extending into the corona radiata, which was suspicious either for  subacute infarct or possible tumor.  The patient was treated by me in the  emergency room with IV Ativan, initially IM Ativan 1 mg without relief,  subsequently 1 mg IV Ativan was given after an IV line was obtained, with  transient cessation of his seizure activity.  Subsequently seizure activity  recurred and additional 1 mg of Ativan was given with temporary relief.  Orders were given for 20 mg/kg of fosphenytoin.  I met with the patient's  mother as well as stepdaughter, who gave the history that the patient the  patient at about 3:30 to 4 p.m. this evening was found to have some  confusion and altered mental status.  He was trying to go from one room in  the house to the other but was apparently going into the closet instead.  He  has poor vision and he has blindness at baseline, but that may have gotten  worse.  He was also speaking less and was slow to respond  to questions.  He,  in fact, urinated on the floor.  The patient also remained quiet less  responsive during the transit to the hospital.  The patient has no known  prior history of strokes, seizures or significant neurological problems.   PAST MEDICAL HISTORY:  Significant for hypertension, which is poorly  controlled.  His family physician is Larina Earthly, M.D.  He has not seen him  for more than a year.   HOME MEDICATIONS:  Antihypertensives which are unknown at the present time.   SOCIAL HISTORY:  The patient lives at home with the mother and stepdaughter.  He smokes.  He does not drink alcohol.  He does not do drugs.   PAST SURGICAL HISTORY:  None.   MEDICATION ALLERGIES:  None.   REVIEW OF SYSTEMS:  Not significant for any recent fever, cough, chest pain,  shortness of breath, or diarrhea.   PHYSICAL EXAMINATION:  GENERAL:  [name], elderly African-American  gentleman who is in distress because of seizures.  VITAL SIGNS:  He is afebrile.  Pulse rate is 100 per minute.  Blood pressure  is 190/100, respiratory rate 16 per minute.  Distal pulses well-felt.  HEENT:  Head is nontraumatic.  ENT exam unremarkable.  NECK:  Supple without bruit.  CARDIAC:  Regular heart sounds.  LUNGS:  Clear to auscultation.  ABDOMEN:  Soft, nontender.  NEUROLOGIC:  The patient is drowsy.  He opens eyes and follows commands and  tries to speak but speech is quite slow and limited.  He answers his names.  He has left gaze deviation with jerking of the eyes to the left.  There is  intermittent jerking of the left arm and leg.  He moves all four extremities  equally against gravity.  Reflexes are brisk throughout.  Plantars are both  downgoing.  Sensation and coordination cannot be reliably tested.  Gait was  not tested.   CT of the head noncontrast study shows low-density in the right basal  ganglia extending up to the corona radiata with vasogenic edema.  This may  be appear to be a subacute  infarct and though there is some possibility of  underlying tumor, which also needs to be ruled out.  There is no acute  abnormality.  Admission labs are pending at this time.   IMPRESSION:  A 77 year old gentleman with partial-onset seizures with  secondary generalization, etiology probably subacute right brain infarct,  though underlying tumor needs to be ruled out.   PLAN:  Aggressive treatment of seizures with IV Ativan as well as  fosphenytoin 20 mg/kg.  If he develops significant respiratory distress, may  need intubation.  MRI scan of the brain when the patient is stable to  evaluate the right brain lesion better.  Subsequent workup for stroke or  tumor as necessary.  I had a long discussion with the patient's mother as  well as stepdaughter and answered questions and explained the above plan.  The patient will be evaluated through the medical service for blood pressure  control and further medical management.  I will be happy to follow the  patient in consults.   I spent about an hour of critical care time at the patient's bedside,  directing his care as well as managing his seizures.           ______________________________  Sunny Schlein. Pearlean Brownie, MD     PPS/MEDQ  D:  04/26/2005  T:  04/27/2005  Job:  [id number]

## 2010-08-22 ENCOUNTER — Ambulatory Visit (HOSPITAL_COMMUNITY): Payer: Medicare Other | Attending: Cardiology

## 2010-08-22 ENCOUNTER — Encounter (HOSPITAL_COMMUNITY): Payer: PRIVATE HEALTH INSURANCE

## 2010-08-22 ENCOUNTER — Other Ambulatory Visit (HOSPITAL_COMMUNITY): Payer: PRIVATE HEALTH INSURANCE | Admitting: Radiology

## 2010-08-22 DIAGNOSIS — Z87891 Personal history of nicotine dependence: Secondary | ICD-10-CM | POA: Insufficient documentation

## 2010-08-22 DIAGNOSIS — I079 Rheumatic tricuspid valve disease, unspecified: Secondary | ICD-10-CM | POA: Insufficient documentation

## 2010-08-22 DIAGNOSIS — I252 Old myocardial infarction: Secondary | ICD-10-CM | POA: Insufficient documentation

## 2010-08-22 DIAGNOSIS — I10 Essential (primary) hypertension: Secondary | ICD-10-CM | POA: Insufficient documentation

## 2010-08-22 DIAGNOSIS — I059 Rheumatic mitral valve disease, unspecified: Secondary | ICD-10-CM | POA: Insufficient documentation

## 2010-08-22 DIAGNOSIS — I2510 Atherosclerotic heart disease of native coronary artery without angina pectoris: Secondary | ICD-10-CM

## 2010-08-22 DIAGNOSIS — E785 Hyperlipidemia, unspecified: Secondary | ICD-10-CM | POA: Insufficient documentation

## 2010-08-23 ENCOUNTER — Encounter: Payer: Self-pay | Admitting: *Deleted

## 2010-08-23 DIAGNOSIS — D649 Anemia, unspecified: Secondary | ICD-10-CM

## 2010-08-23 DIAGNOSIS — I110 Hypertensive heart disease with heart failure: Secondary | ICD-10-CM | POA: Insufficient documentation

## 2010-08-23 DIAGNOSIS — G40909 Epilepsy, unspecified, not intractable, without status epilepticus: Secondary | ICD-10-CM

## 2010-08-23 DIAGNOSIS — D638 Anemia in other chronic diseases classified elsewhere: Secondary | ICD-10-CM | POA: Insufficient documentation

## 2010-08-23 DIAGNOSIS — I10 Essential (primary) hypertension: Secondary | ICD-10-CM

## 2010-08-23 DIAGNOSIS — I340 Nonrheumatic mitral (valve) insufficiency: Secondary | ICD-10-CM | POA: Insufficient documentation

## 2010-08-23 DIAGNOSIS — I214 Non-ST elevation (NSTEMI) myocardial infarction: Secondary | ICD-10-CM

## 2010-08-23 DIAGNOSIS — I519 Heart disease, unspecified: Secondary | ICD-10-CM | POA: Insufficient documentation

## 2010-08-24 ENCOUNTER — Ambulatory Visit: Payer: PRIVATE HEALTH INSURANCE | Admitting: *Deleted

## 2010-08-24 ENCOUNTER — Encounter (HOSPITAL_COMMUNITY): Payer: PRIVATE HEALTH INSURANCE

## 2010-08-24 ENCOUNTER — Encounter: Payer: Self-pay | Admitting: Nurse Practitioner

## 2010-08-24 ENCOUNTER — Ambulatory Visit (INDEPENDENT_AMBULATORY_CARE_PROVIDER_SITE_OTHER): Payer: Medicare Other | Admitting: Nurse Practitioner

## 2010-08-24 DIAGNOSIS — I10 Essential (primary) hypertension: Secondary | ICD-10-CM

## 2010-08-24 DIAGNOSIS — I519 Heart disease, unspecified: Secondary | ICD-10-CM

## 2010-08-24 DIAGNOSIS — R0600 Dyspnea, unspecified: Secondary | ICD-10-CM

## 2010-08-24 DIAGNOSIS — I5020 Unspecified systolic (congestive) heart failure: Secondary | ICD-10-CM

## 2010-08-24 DIAGNOSIS — I2510 Atherosclerotic heart disease of native coronary artery without angina pectoris: Secondary | ICD-10-CM

## 2010-08-24 DIAGNOSIS — R0602 Shortness of breath: Secondary | ICD-10-CM

## 2010-08-24 LAB — BASIC METABOLIC PANEL
BUN: 19 mg/dL (ref 6–23)
CO2: 26 mEq/L (ref 19–32)
Calcium: 8.8 mg/dL (ref 8.4–10.5)
Chloride: 108 mEq/L (ref 96–112)
Creatinine, Ser: 1 mg/dL (ref 0.4–1.5)
GFR: 90.42 mL/min (ref >60.00)
Glucose, Bld: 83 mg/dL (ref 70–99)
Potassium: 4.1 mEq/L (ref 3.5–5.1)
Sodium: 140 mEq/L (ref 135–145)

## 2010-08-24 LAB — BRAIN NATRIURETIC PEPTIDE: Pro B Natriuretic peptide (BNP): 122 pg/mL — ABNORMAL HIGH (ref 0.0–100.0)

## 2010-08-24 MED ORDER — LOSARTAN POTASSIUM 50 MG PO TABS: 100.0000 mg | ORAL_TABLET | Freq: Every day | ORAL | Status: DC

## 2010-08-24 NOTE — Assessment & Plan Note (Signed)
He looks compensated. I have increased his Losartan to 100 mg. BMET and BNP are checked today. I will see him back in one month with a BMET on return. We will need to arrange further cardiology follow up at that visit. He is to continue to minimize his salt. If he has any worsening of his dizziness, he is to let us know. Patient and step daughter are agreeable to this plan and will call if any problems develop in the interim.

## 2010-08-24 NOTE — Patient Instructions (Addendum)
We are going to increase the Losartan (Cozaar) to 100 mg each day. Take two of your 50 mg tablets to equal this dose.  We are going to check some labs today I will see you in one month. We will check some labs when you come back.  Limit your salt Call me if you are having more dizziness

## 2010-08-24 NOTE — Progress Notes (Signed)
[name] Date of Birth: 1933/01/24   History of Present Illness: [name] is seen today for a follow up visit. It is a 3 month check. He is seen for Dr. Deborah Chalk. He has known CAD with prior MI x 2. He has had stenting of the LAD. He has known LV dysfunction. Echo done earlier this week shows his EF to still be low. It is 25 to 30%. He is not having chest pain. He is not short of breath. He is not able to weigh at home. He appears very well compensated. He is tolerating his medicines. His Medicaid covers his medicines. There is some discrepancy as to what he is actually taking. He has had a recent visit with Dr. Felipa Eth. That list has Simvastatin and Metoprolol on it. His list from the drug store lists Coreg and Lipitor. He did not bring his actual medicine bottles.   Current Outpatient Prescriptions on File Prior to Visit  Medication Sig Dispense Refill  . amLODipine (NORVASC) 10 MG tablet Take 10 mg by mouth daily.        Marland Kitchen aspirin 325 MG tablet Take 325 mg by mouth daily.        . carvedilol (COREG) 6.25 MG tablet Take 6.25 mg by mouth 2 (two) times daily.        . clopidogrel (PLAVIX) 75 MG tablet Take 75 mg by mouth daily.        . nitroGLYCERIN (NITROLINGUAL) 0.4 MG/SPRAY spray Place 1 spray under the tongue every 5 (five) minutes as needed.        Marland Kitchen oxybutynin (DITROPAN XL) 15 MG 24 hr tablet Take 15 mg by mouth daily.        . phenytoin (DILANTIN) 100 MG ER capsule Take by mouth 3 (three) times daily.        Marland Kitchen spironolactone (ALDACTONE) 25 MG tablet Take 25 mg by mouth daily.        Marland Kitchen DISCONTD: atorvastatin (LIPITOR) 80 MG tablet Take 80 mg by mouth daily.        Marland Kitchen DISCONTD: losartan (COZAAR) 50 MG tablet Take 50 mg by mouth daily.        Marland Kitchen DISCONTD: nitroGLYCERIN (NITROSTAT) 0.4 MG SL tablet Place 0.4 mg under the tongue every 5 (five) minutes as needed.          No Known Allergies  Past Medical History  Diagnosis Date  . Hypertension   . Acute MI Dec 2010    cardiac  catheriziation and stenting of the LAD (bare metal) 03/14/09              . Seizure disorder     on dilantin  . Vision impairment   . Urethral stricture     with subsequent trauma and has an indwelling Foley catheter in place during his first MI                                                 . Anemia   . NSTEMI (non-ST elevated myocardial infarction) Nov 2011    with LV dysfunction  . LV dysfunction     LV function is in the 45-50% range per echo    Past Surgical History  Procedure Date  . Coronary stent placement 03/14/2009    Stent to the LAD  . Cardiac catheterization 02/2010  History  Smoking status  . Former Smoker -- 1.0 packs/day for 50 years  . Types: Cigarettes  . Quit date: 08/22/2004  Smokeless tobacco  . Never Used    History  Alcohol Use No    History reviewed. No pertinent family history.  Review of Systems: The review of systems is positive for rare episodes of dizziness. He is not having chest pain, shortness of breath, cough or bloating.  He has had a recent CT of the chest that was "ok" per the step daughter.  All other systems were reviewed and are negative.  Physical Exam: BP 144/80  Pulse 62  Ht 5\' 7"  (1.702 m)  Wt 150 lb (68.04 kg)  BMI 23.49 kg/m2 Patient is very pleasant and in no acute distress. Skin is warm and dry. Color is normal.  HEENT is unremarkable except his dentition is poor.  Normocephalic/atraumatic. PERRL. Sclera are nonicteric. Neck is supple. No masses. No JVD. Lungs are clear. Cardiac exam shows a regular rate and rhythm. He has a soft systolic murmur of MR. Abdomen is soft. Extremities are without edema. Gait and ROM are intact. No gross neurologic deficits noted.  LABORATORY DATA:  ECHO done earlier in the week shows an EF of 25 to 30% with mild MR, LAE and grade 1 diastolic dysfunction.    Assessment / Plan:

## 2010-08-24 NOTE — Assessment & Plan Note (Signed)
Losartan is increased.

## 2010-08-24 NOTE — Assessment & Plan Note (Signed)
Currently not having any symptoms.

## 2010-08-25 ENCOUNTER — Telehealth: Payer: Self-pay | Admitting: *Deleted

## 2010-08-25 NOTE — Telephone Encounter (Signed)
Lab Results reported to POA Doristine Counter)

## 2010-08-26 ENCOUNTER — Encounter (HOSPITAL_COMMUNITY): Payer: PRIVATE HEALTH INSURANCE

## 2010-08-29 ENCOUNTER — Encounter (HOSPITAL_COMMUNITY): Payer: PRIVATE HEALTH INSURANCE

## 2010-08-31 ENCOUNTER — Encounter (HOSPITAL_COMMUNITY): Payer: PRIVATE HEALTH INSURANCE

## 2010-09-02 ENCOUNTER — Encounter (HOSPITAL_COMMUNITY): Payer: PRIVATE HEALTH INSURANCE

## 2010-09-05 ENCOUNTER — Encounter (HOSPITAL_COMMUNITY): Payer: PRIVATE HEALTH INSURANCE

## 2010-09-07 ENCOUNTER — Encounter (HOSPITAL_COMMUNITY): Payer: PRIVATE HEALTH INSURANCE

## 2010-09-09 ENCOUNTER — Encounter (HOSPITAL_COMMUNITY): Payer: PRIVATE HEALTH INSURANCE

## 2010-09-12 ENCOUNTER — Encounter (HOSPITAL_COMMUNITY): Payer: PRIVATE HEALTH INSURANCE

## 2010-09-13 ENCOUNTER — Other Ambulatory Visit: Payer: Self-pay | Admitting: Cardiovascular Disease

## 2010-09-14 ENCOUNTER — Encounter (HOSPITAL_COMMUNITY): Payer: PRIVATE HEALTH INSURANCE

## 2010-09-14 ENCOUNTER — Other Ambulatory Visit: Payer: Self-pay | Admitting: *Deleted

## 2010-09-14 MED ORDER — CLOPIDOGREL BISULFATE 75 MG PO TABS: 75.0000 mg | ORAL_TABLET | Freq: Every day | ORAL | Status: DC

## 2010-09-14 MED ORDER — CARVEDILOL 6.25 MG PO TABS: 6.2500 mg | ORAL_TABLET | Freq: Two times a day (BID) | ORAL | Status: DC

## 2010-09-14 MED ORDER — AMLODIPINE BESYLATE 10 MG PO TABS: 10.0000 mg | ORAL_TABLET | Freq: Every day | ORAL | Status: DC

## 2010-09-14 MED ORDER — ATORVASTATIN CALCIUM 80 MG PO TABS: 80.0000 mg | ORAL_TABLET | Freq: Every day | ORAL | Status: DC

## 2010-09-14 MED ORDER — SPIRONOLACTONE 25 MG PO TABS: 25.0000 mg | ORAL_TABLET | Freq: Every day | ORAL | Status: DC

## 2010-09-14 NOTE — Telephone Encounter (Signed)
Fax received from pharmacy. Refill completed. Jodette Miosha Behe RN  

## 2010-09-16 ENCOUNTER — Encounter (HOSPITAL_COMMUNITY): Payer: PRIVATE HEALTH INSURANCE

## 2010-09-19 ENCOUNTER — Encounter (HOSPITAL_COMMUNITY): Payer: PRIVATE HEALTH INSURANCE

## 2010-09-21 ENCOUNTER — Encounter (HOSPITAL_COMMUNITY): Payer: PRIVATE HEALTH INSURANCE

## 2010-09-23 ENCOUNTER — Encounter (HOSPITAL_COMMUNITY): Payer: PRIVATE HEALTH INSURANCE

## 2010-09-26 ENCOUNTER — Other Ambulatory Visit (INDEPENDENT_AMBULATORY_CARE_PROVIDER_SITE_OTHER): Payer: Medicare Other | Admitting: *Deleted

## 2010-09-26 ENCOUNTER — Ambulatory Visit (INDEPENDENT_AMBULATORY_CARE_PROVIDER_SITE_OTHER): Payer: Medicare Other | Admitting: Nurse Practitioner

## 2010-09-26 ENCOUNTER — Encounter (HOSPITAL_COMMUNITY): Payer: PRIVATE HEALTH INSURANCE

## 2010-09-26 ENCOUNTER — Encounter: Payer: Self-pay | Admitting: Nurse Practitioner

## 2010-09-26 VITALS — BP 120/80 | HR 60 | Ht 66.0 in | Wt 150.0 lb

## 2010-09-26 DIAGNOSIS — R0609 Other forms of dyspnea: Secondary | ICD-10-CM

## 2010-09-26 DIAGNOSIS — R0989 Other specified symptoms and signs involving the circulatory and respiratory systems: Secondary | ICD-10-CM

## 2010-09-26 DIAGNOSIS — I519 Heart disease, unspecified: Secondary | ICD-10-CM

## 2010-09-26 DIAGNOSIS — I2510 Atherosclerotic heart disease of native coronary artery without angina pectoris: Secondary | ICD-10-CM

## 2010-09-26 DIAGNOSIS — R0600 Dyspnea, unspecified: Secondary | ICD-10-CM

## 2010-09-26 DIAGNOSIS — I5020 Unspecified systolic (congestive) heart failure: Secondary | ICD-10-CM

## 2010-09-26 LAB — BASIC METABOLIC PANEL
BUN: 20 mg/dL (ref 6–23)
CO2: 23 mEq/L (ref 19–32)
Calcium: 8.5 mg/dL (ref 8.4–10.5)
Chloride: 110 mEq/L (ref 96–112)
Creatinine, Ser: 1.1 mg/dL (ref 0.4–1.5)
GFR: 81.24 mL/min (ref >60.00)
Glucose, Bld: 96 mg/dL (ref 70–99)
Potassium: 4 mEq/L (ref 3.5–5.1)
Sodium: 138 mEq/L (ref 135–145)

## 2010-09-26 NOTE — Assessment & Plan Note (Signed)
He is doing well. No chest pain. We will keep him on his current medicines.

## 2010-09-26 NOTE — Assessment & Plan Note (Signed)
He is on maximum dose of ARB. I don't think his heart rate will tolerate further titration of Coreg. He is on Aldactone as well. BMET is checked today. I will have him see Dr. Antoine Poche in 3 months. Patient and family member are agreeable to this plan and will call if any problems develop in the interim.

## 2010-09-26 NOTE — Patient Instructions (Addendum)
  Continue with your current medicines. Try to weigh yourself each morning and record. Let us know if your weight goes up 3 pounds overnight Continue to limit your salt/sodium intake.  I will have you see Dr. Angelina Sheriff in about 3 months

## 2010-09-26 NOTE — Progress Notes (Signed)
[name] Date of Birth: 01-28-1933   History of Present Illness: [name] is seen today for his one month visit. He is seen for Dr. Antoine Poche. He is a former patient of Dr. Ronnald Nian. He has an ischemic cardiomyopathy. EF is 25%. He is on full dose of Losartan. He is also on Aldactone and Coreg. He feels good. He has stopped eating "cheetoes" and is trying to avoid salt as much as possible. His medicines are verified today. His step daughter is with him and feels like he is doing ok.  Current Outpatient Prescriptions on File Prior to Visit  Medication Sig Dispense Refill  . amLODipine (NORVASC) 10 MG tablet Take 1 tablet (10 mg total) by mouth daily.  30 tablet  3  . aspirin 325 MG tablet Take 325 mg by mouth daily.        Marland Kitchen atorvastatin (LIPITOR) 80 MG tablet Take 1 tablet (80 mg total) by mouth daily.  30 tablet  3  . carvedilol (COREG) 6.25 MG tablet Take 1 tablet (6.25 mg total) by mouth 2 (two) times daily.  60 tablet  3  . clopidogrel (PLAVIX) 75 MG tablet Take 1 tablet (75 mg total) by mouth daily.  30 tablet  3  . losartan (COZAAR) 50 MG tablet Take 2 tablets (100 mg total) by mouth daily.  60 tablet  3  . nitroGLYCERIN (NITROLINGUAL) 0.4 MG/SPRAY spray Place 1 spray under the tongue every 5 (five) minutes as needed.        Marland Kitchen oxybutynin (DITROPAN XL) 15 MG 24 hr tablet Take 15 mg by mouth daily.        . phenytoin (DILANTIN) 100 MG ER capsule Take by mouth 3 (three) times daily.        Marland Kitchen spironolactone (ALDACTONE) 25 MG tablet Take 1 tablet (25 mg total) by mouth daily.  30 tablet  3    Allergies  Allergen Reactions  . Iodinated Diagnostic Agents Rash    Past Medical History  Diagnosis Date  . Hypertension   . Acute MI Dec 2010    cardiac catheriziation and stenting of the LAD (bare metal) 03/14/09              . Seizure disorder     on dilantin  . Vision impairment   . Urethral stricture     with subsequent trauma and has an indwelling Foley catheter in place  during his first MI                                                 . Anemia   . NSTEMI (non-ST elevated myocardial infarction) Nov 2011    with LV dysfunction  . LV dysfunction     EF 25 to 30% per echo May 2012    Past Surgical History  Procedure Date  . Coronary stent placement 03/14/2009    Stent to the LAD  . Cardiac catheterization 02/2010    History  Smoking status  . Former Smoker -- 1.0 packs/day for 50 years  . Types: Cigarettes  . Quit date: 08/22/2004  Smokeless tobacco  . Never Used    History  Alcohol Use No    History reviewed. No pertinent family history.  Review of Systems: The review of systems is as above. No chest pain. No shortness of breath. No edema or  PND reported. He is not dizzy. All other systems were reviewed and are negative.  Physical Exam: BP 120/80  Pulse 60  Ht 5\' 6"  (1.676 m)  Wt 150 lb (68.04 kg)  BMI 24.21 kg/m2 Patient is very pleasant and in no acute distress. Skin is warm and dry. Color is normal.  HEENT is unremarkable except for very poor dentition. Normocephalic/atraumatic. PERRL. Sclera are nonicteric. Neck is supple. No masses. No JVD. Lungs are clear. Cardiac exam shows a regular rate and rhythm. He has a soft systolic murmur at the apex. Abdomen is soft. Extremities are without edema. Gait and ROM are intact. No gross neurologic deficits noted.  LABORATORY DATA: BMET is pending   Assessment / Plan:

## 2010-09-27 ENCOUNTER — Telehealth: Payer: Self-pay | Admitting: *Deleted

## 2010-09-27 NOTE — Telephone Encounter (Signed)
Kathie Rhodes notified of lab results.

## 2010-09-27 NOTE — Telephone Encounter (Signed)
Message copied by Adolphus Birchwood on Tue Sep 27, 2010  8:58 AM ------      Message from: Rosalio Macadamia      Created: Tue Sep 27, 2010  7:36 AM       Ok to report. Labs are satisfactory.

## 2010-09-28 ENCOUNTER — Encounter (HOSPITAL_COMMUNITY): Payer: PRIVATE HEALTH INSURANCE

## 2010-09-30 ENCOUNTER — Encounter (HOSPITAL_COMMUNITY): Payer: PRIVATE HEALTH INSURANCE

## 2010-10-03 ENCOUNTER — Encounter (HOSPITAL_COMMUNITY): Payer: PRIVATE HEALTH INSURANCE

## 2010-10-05 ENCOUNTER — Encounter (HOSPITAL_COMMUNITY): Payer: PRIVATE HEALTH INSURANCE

## 2010-10-07 ENCOUNTER — Encounter (HOSPITAL_COMMUNITY): Payer: PRIVATE HEALTH INSURANCE

## 2010-10-10 ENCOUNTER — Encounter (HOSPITAL_COMMUNITY): Payer: PRIVATE HEALTH INSURANCE

## 2010-10-12 ENCOUNTER — Encounter (HOSPITAL_COMMUNITY): Payer: PRIVATE HEALTH INSURANCE

## 2010-10-14 ENCOUNTER — Encounter (HOSPITAL_COMMUNITY): Payer: PRIVATE HEALTH INSURANCE

## 2010-10-17 ENCOUNTER — Encounter (HOSPITAL_COMMUNITY): Payer: PRIVATE HEALTH INSURANCE

## 2010-10-19 ENCOUNTER — Encounter (HOSPITAL_COMMUNITY): Payer: PRIVATE HEALTH INSURANCE

## 2010-10-21 ENCOUNTER — Encounter (HOSPITAL_COMMUNITY): Payer: PRIVATE HEALTH INSURANCE

## 2010-10-24 ENCOUNTER — Encounter (HOSPITAL_COMMUNITY): Payer: PRIVATE HEALTH INSURANCE

## 2010-10-26 ENCOUNTER — Encounter (HOSPITAL_COMMUNITY): Payer: PRIVATE HEALTH INSURANCE

## 2010-10-26 ENCOUNTER — Other Ambulatory Visit: Payer: Self-pay | Admitting: Nurse Practitioner

## 2010-10-26 MED ORDER — NITROGLYCERIN 0.4 MG/SPRAY TL SOLN: 1.0000 | Status: DC | PRN

## 2010-10-26 NOTE — Telephone Encounter (Signed)
PATIENT ASKING FOR SAMPLE OF NITRO SPRAY HE SAID HE GOT FROM [facility] LAST TIME. CALL BACK ON CELL # [phone].

## 2010-10-26 NOTE — Telephone Encounter (Signed)
escribe medication per fax request  

## 2010-10-27 ENCOUNTER — Emergency Department (HOSPITAL_COMMUNITY): Payer: Medicare Other

## 2010-10-27 ENCOUNTER — Emergency Department (HOSPITAL_COMMUNITY)
Admission: EM | Admit: 2010-10-27 | Discharge: 2010-10-27 | Disposition: A | Discharge status: Home / Self Care | Payer: Medicare Other | Attending: Emergency Medicine | Admitting: Emergency Medicine

## 2010-10-27 ENCOUNTER — Encounter (HOSPITAL_COMMUNITY): Payer: Self-pay | Admitting: Radiology

## 2010-10-27 DIAGNOSIS — I2510 Atherosclerotic heart disease of native coronary artery without angina pectoris: Secondary | ICD-10-CM | POA: Insufficient documentation

## 2010-10-27 DIAGNOSIS — E7800 Pure hypercholesterolemia, unspecified: Secondary | ICD-10-CM | POA: Insufficient documentation

## 2010-10-27 DIAGNOSIS — R1011 Right upper quadrant pain: Secondary | ICD-10-CM | POA: Insufficient documentation

## 2010-10-27 DIAGNOSIS — I10 Essential (primary) hypertension: Secondary | ICD-10-CM | POA: Insufficient documentation

## 2010-10-27 DIAGNOSIS — N400 Enlarged prostate without lower urinary tract symptoms: Secondary | ICD-10-CM | POA: Insufficient documentation

## 2010-10-27 DIAGNOSIS — N323 Diverticulum of bladder: Secondary | ICD-10-CM | POA: Insufficient documentation

## 2010-10-27 DIAGNOSIS — R569 Unspecified convulsions: Secondary | ICD-10-CM | POA: Insufficient documentation

## 2010-10-27 HISTORY — DX: Benign prostatic hyperplasia without lower urinary tract symptoms: N40.0

## 2010-10-27 HISTORY — DX: Atherosclerotic heart disease of native coronary artery without angina pectoris: I25.10

## 2010-10-27 LAB — COMPREHENSIVE METABOLIC PANEL
ALT: 43 U/L (ref 0–53)
AST: 81 U/L — ABNORMAL HIGH (ref 0–37)
CO2: 24 mEq/L (ref 19–32)
Calcium: 9.2 mg/dL (ref 8.4–10.5)
GFR calc non Af Amer: >60 mL/min (ref >60)
Potassium: 3.4 mEq/L — ABNORMAL LOW (ref 3.5–5.1)
Sodium: 136 mEq/L (ref 135–145)

## 2010-10-27 LAB — URINALYSIS, ROUTINE W REFLEX MICROSCOPIC
Bilirubin Urine: NEGATIVE
Glucose, UA: 100 mg/dL — AB
Hgb urine dipstick: NEGATIVE
Ketones, ur: NEGATIVE mg/dL
Protein, ur: 30 mg/dL — AB

## 2010-10-27 LAB — URINE MICROSCOPIC-ADD ON

## 2010-10-27 LAB — CBC
HCT: 34.9 % — ABNORMAL LOW (ref 39.0–52.0)
Hemoglobin: 11.6 g/dL — ABNORMAL LOW (ref 13.0–17.0)
MCH: 29.5 pg (ref 26.0–34.0)
MCHC: 33.2 g/dL (ref 30.0–36.0)
MCV: 88.8 fL (ref 78.0–100.0)

## 2010-10-27 LAB — DIFFERENTIAL
Basophils Absolute: 0 10*3/uL (ref 0.0–0.1)
Basophils Relative: 0 % (ref 0–1)
Eosinophils Relative: 1 % (ref 0–5)
Lymphocytes Relative: 6 % — ABNORMAL LOW (ref 12–46)
Monocytes Absolute: 0.4 10*3/uL (ref 0.1–1.0)
Neutro Abs: 8.6 10*3/uL — ABNORMAL HIGH (ref 1.7–7.7)

## 2010-10-27 MED ORDER — IOHEXOL 300 MG/ML  SOLN
100.0000 mL | Freq: Once | INTRAMUSCULAR | Status: AC | PRN
  Administered 2010-10-27: 100 mL via INTRAVENOUS

## 2010-10-28 ENCOUNTER — Encounter (HOSPITAL_COMMUNITY): Payer: PRIVATE HEALTH INSURANCE

## 2010-10-31 ENCOUNTER — Telehealth: Payer: Self-pay | Admitting: Internal Medicine

## 2010-10-31 ENCOUNTER — Encounter (HOSPITAL_COMMUNITY): Payer: PRIVATE HEALTH INSURANCE

## 2010-10-31 NOTE — Telephone Encounter (Signed)
Sure, that's okay

## 2010-10-31 NOTE — Telephone Encounter (Signed)
Spoke with pts daughter. Scheduled pt to see Dr. Marina Goodell 11/04/10@10am . Pts daughter aware of appt date and time.

## 2010-10-31 NOTE — Telephone Encounter (Signed)
Dr. Felipa Eth requesting pt be seen for abdominal pain. Pts daughter can only bring him on Friday for an appt. There is an appt slot available 11/04/10 but it is a follow-up slot. Dr. Marina Goodell is it ok to book this pt at 10am on 11/04/10, he would be a new pt? Please advise.

## 2010-11-02 ENCOUNTER — Encounter (HOSPITAL_COMMUNITY): Payer: PRIVATE HEALTH INSURANCE

## 2010-11-04 ENCOUNTER — Ambulatory Visit: Payer: Medicare Other | Admitting: Internal Medicine

## 2010-11-04 ENCOUNTER — Encounter: Payer: Self-pay | Admitting: Internal Medicine

## 2010-11-04 ENCOUNTER — Ambulatory Visit (INDEPENDENT_AMBULATORY_CARE_PROVIDER_SITE_OTHER): Payer: Medicare Other | Admitting: Internal Medicine

## 2010-11-04 ENCOUNTER — Encounter (HOSPITAL_COMMUNITY): Payer: PRIVATE HEALTH INSURANCE

## 2010-11-04 ENCOUNTER — Ambulatory Visit: Payer: Medicare Other

## 2010-11-04 VITALS — BP 126/62 | HR 80 | Ht 65.0 in | Wt 146.0 lb

## 2010-11-04 DIAGNOSIS — R932 Abnormal findings on diagnostic imaging of liver and biliary tract: Secondary | ICD-10-CM

## 2010-11-04 DIAGNOSIS — R1011 Right upper quadrant pain: Secondary | ICD-10-CM

## 2010-11-04 DIAGNOSIS — I2510 Atherosclerotic heart disease of native coronary artery without angina pectoris: Secondary | ICD-10-CM

## 2010-11-04 DIAGNOSIS — D649 Anemia, unspecified: Secondary | ICD-10-CM

## 2010-11-04 LAB — HEPATIC FUNCTION PANEL
ALT: 37 U/L (ref 0–53)
Alkaline Phosphatase: 84 U/L (ref 39–117)
Bilirubin, Direct: 0.2 mg/dL (ref 0.0–0.3)
Total Protein: 7.1 g/dL (ref 6.0–8.3)

## 2010-11-04 LAB — IBC PANEL: Saturation Ratios: 32.8 % (ref 20.0–50.0)

## 2010-11-04 NOTE — Patient Instructions (Signed)
Abdominal Ultrasound 11/08/10 11:30 am arrive at 11:15 am Nothing to eat or drink 4 hours prior Labs ordered for you to go to basement floor today and have drawn. ERCP scheduled for 11/22/10 1:30 pm arrive at 12:30 pm nothing to eat or drink after midnight. We will send a letter to your cardiologist to see how long to hold your Plavix prior to procedure. Our office will contact you.

## 2010-11-04 NOTE — Progress Notes (Signed)
HISTORY OF PRESENT ILLNESS:  [name] is a 77 y.o. male with multiple significant medical problems as listed below. Sent now regarding abdominal pain and abnormal abdominal imaging. He is accompanied by his step daughter [name] [phone]). He denies prior GI history or procedures.Presented to ER 10-27-10 with RUQ pain. Labs (reviewed) remarkable for WBC 9.7 w/ left shift, Hg 11.6 w/ MCV 88.8, SGOT 81, SGPT 43, Alk phos 91, bili 1.5, lipase 24. Contrast CT (reveiewed) revealed new intra and extrahepatic ductal dilation and gallbladder distention. No evidence for stones or mass. Treated with pain meds and released. Now states that he has had no further pain and reports a negative GI review of symptoms except for weight loss which is estimated to be 20# in the past year or so. He is on Plavix for intra coronary stents placed, I believe, in 2010. Had MI 2011. Now, between cardiologists.Has indwelling Foley catheter. Health status has been stable the past 6 months.  REVIEW OF SYSTEMS:  All non-GI ROS negative except for bladder dysfunction  Past Medical History  Diagnosis Date  . Hypertension   . Acute MI Dec 2010    cardiac catheriziation and stenting of the LAD (bare metal) 03/14/09              . Seizure disorder     on dilantin  . Vision impairment   . Urethral stricture     with subsequent trauma and has an indwelling Foley catheter in place during his first MI                                                 . Anemia   . NSTEMI (non-ST elevated myocardial infarction) Nov 2011    with LV dysfunction  . LV dysfunction     EF 25 to 30% per echo May 2012  . CAD (coronary artery disease)   . Prostatic hypertrophy   . COPD (chronic obstructive pulmonary disease)     Past Surgical History  Procedure Date  . Coronary stent placement 03/14/2009    Stent to the LAD  . Cardiac catheterization 02/2010    Social History [name]  reports that he quit smoking about 6 years ago.  His smoking use included Cigarettes. He has a 50 pack-year smoking history. He has never used smokeless tobacco. He reports that he does not drink alcohol or use illicit drugs.  family history is negative for Colon cancer.  Allergies  Allergen Reactions  . Iodinated Diagnostic Agents Rash    Pt give 50 mg Benadryl prior to Omnipaque injection, tolerated well.       PHYSICAL EXAMINATION: Vital signs: BP 126/62  Pulse 80  Ht 5\' 5"  (1.651 m)  Wt 146 lb (66.225 kg)  BMI 24.30 kg/m2  Constitutional: generally well-appearing, no acute distress Psychiatric: alert and oriented x3, cooperative Eyes: extraocular movements intact, anicteric, conjunctiva pink Mouth: oral pharynx moist, no lesions Neck: supple no lymphadenopathy Cardiovascular: heart regular rate and rhythm, no murmur Lungs: clear to auscultation bilaterally Abdomen: soft, nontender, nondistended, no obvious ascites, no peritoneal signs, normal bowel sounds, no organomegaly Extremities: no lower extremity edema bilaterally Skin: no lesions on visible extremities Neuro: No focal deficits.   ASSESSMENT:  1. Acute RUQ pain. Resolved 2. Mildly elevated LFTs and biliary dilation on CT. R/O stones, biliary stricture/stenosis, or mass. 3. Normocytic  anemia 4. Multiple significant medical problems including CAD on plavix and COPD   PLAN:  1. Repeat LFTs 2. Anemia panel 3. Abdominal US to r/o gallstones 4. IF [place] negative for stones, then ERCP with possible sphincterotomy +/- stone extraction +/- stent. The nature of the procedure, as well as the risks (pancreatitis, perforation, bleeding, infection, allergy, cardiopulmonary events), benefits, and alternatives were carefully and thoroughly reviewed with the patient. Ample time for discussion and questions allowed. The patient (and his relative) understood, was satisfied, and agreed to proceed. He is high risk. Need to discuss holding Plavix for the procedure with his cardiology  team. Will need sedation via anesthesiology / General anesthesia. 5. If gallstones found in GB only and LFT's normalize, would proceed with Lap chole w/ IOC - as patient is pain free.

## 2010-11-06 ENCOUNTER — Encounter: Payer: Self-pay | Admitting: Internal Medicine

## 2010-11-07 ENCOUNTER — Encounter (HOSPITAL_COMMUNITY): Payer: PRIVATE HEALTH INSURANCE

## 2010-11-08 ENCOUNTER — Telehealth: Payer: Self-pay

## 2010-11-08 ENCOUNTER — Telehealth: Payer: Self-pay | Admitting: *Deleted

## 2010-11-08 ENCOUNTER — Ambulatory Visit (HOSPITAL_COMMUNITY)
Admission: RE | Admit: 2010-11-08 | Discharge: 2010-11-08 | Disposition: A | Discharge status: Home / Self Care | Payer: Medicare Other | Source: Ambulatory Visit | Attending: Internal Medicine | Admitting: Internal Medicine

## 2010-11-08 DIAGNOSIS — D649 Anemia, unspecified: Secondary | ICD-10-CM

## 2010-11-08 DIAGNOSIS — R1011 Right upper quadrant pain: Secondary | ICD-10-CM

## 2010-11-08 DIAGNOSIS — K8020 Calculus of gallbladder without cholecystitis without obstruction: Secondary | ICD-10-CM | POA: Insufficient documentation

## 2010-11-08 DIAGNOSIS — R109 Unspecified abdominal pain: Secondary | ICD-10-CM | POA: Insufficient documentation

## 2010-11-08 DIAGNOSIS — N281 Cyst of kidney, acquired: Secondary | ICD-10-CM | POA: Insufficient documentation

## 2010-11-08 DIAGNOSIS — I2510 Atherosclerotic heart disease of native coronary artery without angina pectoris: Secondary | ICD-10-CM

## 2010-11-08 DIAGNOSIS — R932 Abnormal findings on diagnostic imaging of liver and biliary tract: Secondary | ICD-10-CM

## 2010-11-08 NOTE — Telephone Encounter (Signed)
Called and spoke with Kathie Rhodes (his niece) regarding holding his Plavix 6 days prior and she informed me that he does not want to have ERCP done.  He wants to wait until results of his U/S are back before making a decision to have it or not.  Kathie Rhodes will call first of next week and let me know.

## 2010-11-08 NOTE — Telephone Encounter (Signed)
Message copied by Michele Mcalpine on Tue Nov 08, 2010  4:56 PM ------      Message from: Hilarie Fredrickson      Created: Tue Nov 08, 2010  4:33 PM       SEE MY OFFICE NOTE IMPRESSION AND PLAN... Given the resolution of abdominal pain, normalization of LFTs, and now documented gallstones with a now normal bile duct diameter, he should be referred directly to General Surgery for lap chole with IOC. Please set this appointment up ASAP. Also, CANCEL ERCP THAT HAS BEEN SET UP AT THE HOSPITAL IN 2 WEEKS. Bonita Quin, please Discuss this with the patient and most importantly his step daughter (who came to his office appointment the other day)

## 2010-11-09 ENCOUNTER — Encounter (HOSPITAL_COMMUNITY): Payer: PRIVATE HEALTH INSURANCE

## 2010-11-09 ENCOUNTER — Encounter: Payer: Self-pay | Admitting: Cardiology

## 2010-11-09 ENCOUNTER — Telehealth: Payer: Self-pay

## 2010-11-09 NOTE — Telephone Encounter (Signed)
Spoke with Kathie Rhodes patient's step-daughter. She states she is on her way to [facility] and will not be back until Monday. She states the patient had already told her he was not going to have the ERCP(this has been cancelled and he was to see surgeon instead). She states she will talk with the patient about going to see a surgeon but she wants to cancel the Tues appointment until she is back in town and can talk with Mr. Mclure. She states she needs to be called with all his appointments and results because he does not understand all of it. She does not think he will call us back from our earlier call. She states she will let him know about the need to see a surgeon to discuss lap chole and will call us on Monday to let us know if patient is agreeable to seeing a surgeon.

## 2010-11-09 NOTE — Telephone Encounter (Signed)
Pt scheduled for an appt with [facility] Dr. Lodema Pilot 11/15/10 arrival time 10:30am/appt time 10:45am. Bring list of meds and dosages. Called pt and pts daughter and left message for them to call back.

## 2010-11-09 NOTE — Telephone Encounter (Signed)
Kathie Rhodes (step daughter's # [phone]

## 2010-11-11 ENCOUNTER — Encounter (HOSPITAL_COMMUNITY): Payer: PRIVATE HEALTH INSURANCE

## 2010-11-14 ENCOUNTER — Encounter (HOSPITAL_COMMUNITY): Payer: PRIVATE HEALTH INSURANCE

## 2010-11-14 NOTE — Telephone Encounter (Signed)
Pts stepdaughter called back and will take the pt to his appt tomorrow with the surgeon.

## 2010-11-15 ENCOUNTER — Encounter (INDEPENDENT_AMBULATORY_CARE_PROVIDER_SITE_OTHER): Payer: Self-pay | Admitting: General Surgery

## 2010-11-15 ENCOUNTER — Ambulatory Visit (INDEPENDENT_AMBULATORY_CARE_PROVIDER_SITE_OTHER): Payer: Medicaid Other | Admitting: General Surgery

## 2010-11-15 VITALS — BP 148/86 | HR 70 | Temp 98.4°F | Ht 66.0 in | Wt 143.2 lb

## 2010-11-15 DIAGNOSIS — K8020 Calculus of gallbladder without cholecystitis without obstruction: Secondary | ICD-10-CM

## 2010-11-15 NOTE — Progress Notes (Signed)
[name] is a 77 y.o. male.    Chief Complaint  Patient presents with  . Other    Eval of gallbladder    HPI HPI This patient is referred for evaluation of cholelithiasis and abdominal pain. He was seen in the emergency room approximately 3 weeks ago for epigastric pain which started randomly and suddenly. He has a history of 2 previous myocardial infarctions and at the time he felt that this was a repeat episode of his chest pain and he took a nitroglycerin for relief. He did have some improvement with nitroglycerin but continued to have persistent pain which he states was different than his prior cardiac pain and was located more on the right side. He thought he would feel better if he vomited and so he had an episode of self-induced vomiting but denies any relief of his symptoms with that. He went to the emergency room and the pain lasted a few hours but was relieved with the morphine administered and the hospital.  Also at that time he had some elevated transaminases and an elevated bilirubin. He was referred to Dr. Marina Goodell, a gastroenterologist, for further evaluation who ordered the abdominal ultrasound looking for gallstones. He was referred here by Dr. Marina Goodell. Since then he has not any problems he denies any fevers or chills or nausea or vomiting he has had approximately 7 pound weight loss over the month. He denies any blood in the stools or melena he has not had any prior history of colonoscopy although he states his bowels are normal except for occasional constipation. He is currently on Plavix and is followed by [facility] cardiology for this. His last myocardial infarction was approximately November of 2011.                            Past Medical History  Diagnosis Date  . Hypertension   . Acute MI Dec 2010    cardiac catheriziation and stenting of the LAD (bare metal) 03/14/09              . Seizure disorder     on dilantin  . Vision impairment   . Urethral stricture     with  subsequent trauma and has an indwelling Foley catheter in place during his first MI                                                 . Anemia   . NSTEMI (non-ST elevated myocardial infarction) Nov 2011    with LV dysfunction  . LV dysfunction     EF 25 to 30% per echo May 2012  . CAD (coronary artery disease)   . Prostatic hypertrophy   . COPD (chronic obstructive pulmonary disease)     Past Surgical History  Procedure Date  . Coronary stent placement 03/14/2009    Stent to the LAD  . Cardiac catheterization 02/2010    Family History  Problem Relation Age of Onset  . Colon cancer Neg Hx     Social History History  Substance Use Topics  . Smoking status: Former Smoker -- 1.0 packs/day for 50 years    Types: Cigarettes    Quit date: 08/22/2004  . Smokeless tobacco: Never Used  . Alcohol Use: No    Allergies  Allergen Reactions  . Iodinated  Diagnostic Agents Rash    Pt give 50 mg Benadryl prior to Omnipaque injection, tolerated well.    Current Outpatient Prescriptions  Medication Sig Dispense Refill  . amLODipine (NORVASC) 10 MG tablet Take 1 tablet (10 mg total) by mouth daily.  30 tablet  3  . aspirin 325 MG tablet Take 325 mg by mouth daily.        Marland Kitchen atorvastatin (LIPITOR) 80 MG tablet Take 1 tablet (80 mg total) by mouth daily.  30 tablet  3  . carvedilol (COREG) 6.25 MG tablet Take 1 tablet (6.25 mg total) by mouth 2 (two) times daily.  60 tablet  3  . clopidogrel (PLAVIX) 75 MG tablet Take 1 tablet (75 mg total) by mouth daily.  30 tablet  3  . losartan (COZAAR) 50 MG tablet Take 2 tablets (100 mg total) by mouth daily.  60 tablet  3  . nitroGLYCERIN (NITROLINGUAL) 0.4 MG/SPRAY spray Place 1 spray under the tongue every 5 (five) minutes as needed.  12 g  11  . oxybutynin (DITROPAN XL) 15 MG 24 hr tablet Take 15 mg by mouth daily.        . phenytoin (DILANTIN) 100 MG ER capsule Take by mouth 3 (three) times daily.        Marland Kitchen spironolactone (ALDACTONE) 25 MG tablet  Take 1 tablet (25 mg total) by mouth daily.  30 tablet  3    Review of Systems Review of Systems  Constitutional: Positive for weight loss.  HENT: Negative.   Eyes: Negative.   Respiratory: Negative.   Cardiovascular: Negative.   Gastrointestinal: Positive for nausea, vomiting, abdominal pain and diarrhea.  Genitourinary: Positive for dysuria, urgency and frequency.  Musculoskeletal: Negative.   Skin: Negative.   Neurological: Positive for seizures.  Endo/Heme/Allergies: Negative.   Psychiatric/Behavioral: Negative.     Physical Exam Physical Exam  Constitutional: He is oriented to person, place, and time. He appears well-developed and well-nourished. No distress.  HENT:  Head: Normocephalic and atraumatic.  Mouth/Throat: No oropharyngeal exudate.  Eyes: Conjunctivae and EOM are normal. Pupils are equal, round, and reactive to light. Right eye exhibits no discharge. Left eye exhibits no discharge. No scleral icterus.  Neck: Normal range of motion. Neck supple. No tracheal deviation present.  Cardiovascular: Normal rate, regular rhythm and normal heart sounds.   Respiratory: Effort normal and breath sounds normal. No stridor. He has no wheezes.  GI: Soft. Bowel sounds are normal. He exhibits no distension and no mass. There is no tenderness. There is no rebound and no guarding.  Musculoskeletal: Normal range of motion.  Neurological: He is alert and oriented to person, place, and time. He has normal reflexes.  Skin: Skin is warm and dry. No rash noted. He is not diaphoretic. No erythema. No pallor.  Psychiatric: He has a normal mood and affect. His behavior is normal. Judgment and thought content normal.     Blood pressure 148/86, pulse 70, temperature 98.4 F (36.9 C), temperature source Temporal, height 5\' 6"  (1.676 m), weight 143 lb 3.2 oz (64.955 kg).  Assessment/Plan He does have cholelithiasis with episodes of pain which could certainly be related to his gallstones. He  has also had some elevated LFTs which is concerning for a possible choledocholithiasis. He did not have evidence of cholecystitis at this time. I would generally recommend that we perform laparoscopic cholecystectomy on this patient, however given his age and comorbidities and a fairly recent myocardial infarction this recommendation is not as firm. I  explained with the daughter and the patient my hesitation to perform abdominal surgery given his recent myocardial infarctions and other comorbidities and they expressed understanding. I think he would benefit from the surgery. I recommended that he visited his cardiologist at [facility] cardiology and get their opinion with regards to his tolerance for surgery. If they feel that it would be safe to offer him surgery, then I think we will proceed with cholecystectomy.  Lodema Pilot DAVID 11/15/2010, 1:31 PM

## 2010-11-16 ENCOUNTER — Encounter (HOSPITAL_COMMUNITY): Payer: PRIVATE HEALTH INSURANCE

## 2010-11-18 ENCOUNTER — Encounter (HOSPITAL_COMMUNITY): Payer: PRIVATE HEALTH INSURANCE

## 2010-11-21 ENCOUNTER — Encounter (HOSPITAL_COMMUNITY): Payer: PRIVATE HEALTH INSURANCE

## 2010-11-22 ENCOUNTER — Other Ambulatory Visit: Payer: Medicare Other | Admitting: Internal Medicine

## 2010-11-23 ENCOUNTER — Encounter (HOSPITAL_COMMUNITY): Payer: PRIVATE HEALTH INSURANCE

## 2010-11-25 ENCOUNTER — Encounter (HOSPITAL_COMMUNITY): Payer: PRIVATE HEALTH INSURANCE

## 2010-11-28 ENCOUNTER — Encounter (HOSPITAL_COMMUNITY): Payer: PRIVATE HEALTH INSURANCE

## 2010-11-30 ENCOUNTER — Encounter (HOSPITAL_COMMUNITY): Payer: PRIVATE HEALTH INSURANCE

## 2010-12-02 ENCOUNTER — Encounter (HOSPITAL_COMMUNITY): Payer: PRIVATE HEALTH INSURANCE

## 2010-12-05 ENCOUNTER — Encounter (HOSPITAL_COMMUNITY): Payer: PRIVATE HEALTH INSURANCE

## 2010-12-07 ENCOUNTER — Encounter (HOSPITAL_COMMUNITY): Payer: PRIVATE HEALTH INSURANCE

## 2010-12-07 ENCOUNTER — Ambulatory Visit (INDEPENDENT_AMBULATORY_CARE_PROVIDER_SITE_OTHER): Payer: Medicare Other | Admitting: Cardiovascular Disease

## 2010-12-07 ENCOUNTER — Encounter: Payer: Self-pay | Admitting: Cardiovascular Disease

## 2010-12-07 VITALS — BP 146/86 | HR 76 | Resp 14 | Ht 65.0 in | Wt 145.0 lb

## 2010-12-07 DIAGNOSIS — I519 Heart disease, unspecified: Secondary | ICD-10-CM

## 2010-12-07 DIAGNOSIS — I10 Essential (primary) hypertension: Secondary | ICD-10-CM

## 2010-12-07 DIAGNOSIS — Z01810 Encounter for preprocedural cardiovascular examination: Secondary | ICD-10-CM

## 2010-12-07 DIAGNOSIS — I2510 Atherosclerotic heart disease of native coronary artery without angina pectoris: Secondary | ICD-10-CM

## 2010-12-07 MED ORDER — LOSARTAN POTASSIUM 100 MG PO TABS: 100.0000 mg | ORAL_TABLET | Freq: Every day | ORAL | Status: DC

## 2010-12-07 MED ORDER — CARVEDILOL 12.5 MG PO TABS: 12.5000 mg | ORAL_TABLET | Freq: Two times a day (BID) | ORAL | Status: DC

## 2010-12-07 NOTE — Assessment & Plan Note (Signed)
Stable no angina  Continue current meds and increase beta blocker and ACE

## 2010-12-07 NOTE — Patient Instructions (Signed)
Your physician recommends that you schedule a follow-up appointment in: 6- 8 WEEKS WITH DR Graciela Husbands OR DR Ladona Ridgel  Your physician has recommended you make the following change in your medication: STOP AMLODPINE INCREASE CARVEDILOL TO 12.5 MG  1 TWICE DAILY AND INCREASE LOSARTAN TO 100 MG EVERY DAY

## 2010-12-07 NOTE — Progress Notes (Signed)
77 yo referred by general surgery Dr Renaldo Fiddler for surgical clearance  History of anterior MI 03/14/09.  Subsequent admission for [facility]SCP with positive troponin Cath by [facility] with patent LAD stent and small D1 disease.  No culprit medical Rx recommended with Plavix added.  Echo 5/21 with EF 25-30%.  Has had nausea abdominal pain weight loss.  Needs gallbladder surgery.  He is legally blind but active.  No angina or CHF.  Despite ischemic DCM has not been evaluated for AICD.  Niece was with him today and very knowledgable and attentive.  Compliant with meds.  Actively playing piano including at [facility] on Tuesdays in the [facility].   Separate time reviewing old records from [place], cath films , and records from [facility] 30 minutes   ROS: Denies fever, malais, weight loss, , decreased visual acuity, cough, sputum, SOB, hemoptysis, pleuritic pain, palpitaitons, heartburn, abdominal pain, melena, lower extremity edema, claudication, or rash.  All other systems reviewed and negative   General: Affect appropriate Healthy:  appears stated age HEENT: normal Neck supple with no adenopathy JVP normal no bruits no thyromegaly Lungs clear with no wheezing and good diaphragmatic motion Heart:  S1/S2 no murmur,rub, gallop or click PMI enlarged Abdomen: benighn, BS positve, no tenderness, no AAA no bruit.  No HSM or HJR Distal pulses intact with no bruits No edema Neuro non-focal Skin warm and dry No muscular weakness Legally blind with poor dentition  Medications Current Outpatient Prescriptions  Medication Sig Dispense Refill  . amLODipine (NORVASC) 10 MG tablet Take 1 tablet (10 mg total) by mouth daily.  30 tablet  3  . aspirin 325 MG tablet Take 325 mg by mouth daily.        Marland Kitchen atorvastatin (LIPITOR) 80 MG tablet Take 1 tablet (80 mg total) by mouth daily.  30 tablet  3  . carvedilol (COREG) 6.25 MG tablet Take 1 tablet (6.25 mg total) by mouth 2 (two) times daily.  60 tablet  3  .  clopidogrel (PLAVIX) 75 MG tablet Take 1 tablet (75 mg total) by mouth daily.  30 tablet  3  . losartan (COZAAR) 50 MG tablet Take 2 tablets (100 mg total) by mouth daily.  60 tablet  3  . nitroGLYCERIN (NITROLINGUAL) 0.4 MG/SPRAY spray Place 1 spray under the tongue every 5 (five) minutes as needed.  12 g  11  . oxybutynin (DITROPAN XL) 15 MG 24 hr tablet Take 15 mg by mouth daily.        . phenytoin (DILANTIN) 100 MG ER capsule Take by mouth 3 (three) times daily.        Marland Kitchen spironolactone (ALDACTONE) 25 MG tablet Take 1 tablet (25 mg total) by mouth daily.  30 tablet  3    Allergies Iodinated diagnostic agents  Family History: Family History  Problem Relation Age of Onset  . Colon cancer Neg Hx     Social History: History   Social History  . Marital Status: Married    Spouse Name: N/A    Number of Children: 0  . Years of Education: N/A   Occupational History  . RETIRED    Social History Main Topics  . Smoking status: Former Smoker -- 1.0 packs/day for 50 years    Types: Cigarettes    Quit date: 08/22/2004  . Smokeless tobacco: Never Used  . Alcohol Use: No  . Drug Use: No  . Sexually Active: No   Other Topics Concern  . Not on file  Social History Narrative  . No narrative on file    Electrocardiogram:NSR 25 old anterior MI  Assessment and Plan  .

## 2010-12-07 NOTE — Assessment & Plan Note (Signed)
With decreased EF calcium blocker not ideal.  Will d/c and adjust doses of beta blocker and ACE upwards

## 2010-12-07 NOTE — Assessment & Plan Note (Addendum)
Clear to have laprascopic cholycystectomy.  No angina or active heart failure and good revascularization on most recent cath.  Request notification post op to follow rhythm and fluid status.  I think primary risk would be arrythmia like afib and volume overload Stent in LAD over a year old so should be ok to hold Plavix 5 days before surgery

## 2010-12-07 NOTE — Assessment & Plan Note (Signed)
Will recheck echo and assess mitral inflows for estimated EDP.  After gallbladder surgery will refer to EPS for consideration of AICD

## 2010-12-09 ENCOUNTER — Encounter (HOSPITAL_COMMUNITY): Payer: PRIVATE HEALTH INSURANCE

## 2010-12-10 ENCOUNTER — Emergency Department (HOSPITAL_COMMUNITY)
Admission: EM | Admit: 2010-12-10 | Discharge: 2010-12-10 | Disposition: A | Discharge status: Home / Self Care | Payer: Medicare Other | Attending: Emergency Medicine | Admitting: Emergency Medicine

## 2010-12-10 DIAGNOSIS — E7800 Pure hypercholesterolemia, unspecified: Secondary | ICD-10-CM | POA: Insufficient documentation

## 2010-12-10 DIAGNOSIS — I10 Essential (primary) hypertension: Secondary | ICD-10-CM | POA: Insufficient documentation

## 2010-12-10 DIAGNOSIS — N400 Enlarged prostate without lower urinary tract symptoms: Secondary | ICD-10-CM | POA: Insufficient documentation

## 2010-12-10 DIAGNOSIS — T83091A Other mechanical complication of indwelling urethral catheter, initial encounter: Secondary | ICD-10-CM | POA: Insufficient documentation

## 2010-12-10 DIAGNOSIS — G40909 Epilepsy, unspecified, not intractable, without status epilepticus: Secondary | ICD-10-CM | POA: Insufficient documentation

## 2010-12-10 DIAGNOSIS — I2510 Atherosclerotic heart disease of native coronary artery without angina pectoris: Secondary | ICD-10-CM | POA: Insufficient documentation

## 2010-12-10 DIAGNOSIS — Y846 Urinary catheterization as the cause of abnormal reaction of the patient, or of later complication, without mention of misadventure at the time of the procedure: Secondary | ICD-10-CM | POA: Insufficient documentation

## 2010-12-12 ENCOUNTER — Encounter (HOSPITAL_COMMUNITY): Payer: PRIVATE HEALTH INSURANCE

## 2010-12-12 ENCOUNTER — Other Ambulatory Visit: Payer: Self-pay | Admitting: Cardiology

## 2010-12-13 ENCOUNTER — Other Ambulatory Visit: Payer: Self-pay | Admitting: *Deleted

## 2010-12-13 NOTE — Telephone Encounter (Signed)
Refill from [facility] norvasc/ dr Roseanne Reno stopped it. No refill done

## 2010-12-14 ENCOUNTER — Encounter (HOSPITAL_COMMUNITY): Payer: PRIVATE HEALTH INSURANCE

## 2010-12-16 ENCOUNTER — Encounter (HOSPITAL_COMMUNITY): Payer: PRIVATE HEALTH INSURANCE

## 2010-12-19 ENCOUNTER — Telehealth: Payer: Self-pay | Admitting: Cardiovascular Disease

## 2010-12-19 ENCOUNTER — Encounter (HOSPITAL_COMMUNITY): Payer: PRIVATE HEALTH INSURANCE

## 2010-12-19 NOTE — Telephone Encounter (Signed)
Spoke with pt dtr, since his carvedilol was increased he is having trouble with unsteady gait and feeling dizzy. They will cut the carvedilol in one half to see if that helps with his symptoms. They will call back with further problems [name]

## 2010-12-19 NOTE — Telephone Encounter (Signed)
Pt carvedilol was increased and pt is c/o feeling dizzy and lightheaded. Pt step daughter wants to know if she can cut medication in half. Please return call to discuss further.

## 2010-12-21 ENCOUNTER — Encounter (HOSPITAL_COMMUNITY): Payer: PRIVATE HEALTH INSURANCE

## 2010-12-23 ENCOUNTER — Encounter (HOSPITAL_COMMUNITY): Payer: PRIVATE HEALTH INSURANCE

## 2010-12-26 ENCOUNTER — Encounter (HOSPITAL_COMMUNITY): Payer: PRIVATE HEALTH INSURANCE

## 2010-12-28 ENCOUNTER — Encounter (HOSPITAL_COMMUNITY): Payer: PRIVATE HEALTH INSURANCE

## 2010-12-30 ENCOUNTER — Encounter (HOSPITAL_COMMUNITY): Payer: PRIVATE HEALTH INSURANCE

## 2011-01-02 ENCOUNTER — Encounter (HOSPITAL_COMMUNITY): Payer: PRIVATE HEALTH INSURANCE

## 2011-01-04 ENCOUNTER — Encounter (HOSPITAL_COMMUNITY): Payer: PRIVATE HEALTH INSURANCE

## 2011-01-06 ENCOUNTER — Encounter (HOSPITAL_COMMUNITY): Payer: PRIVATE HEALTH INSURANCE

## 2011-01-09 ENCOUNTER — Encounter (HOSPITAL_COMMUNITY): Payer: PRIVATE HEALTH INSURANCE

## 2011-01-11 ENCOUNTER — Encounter (HOSPITAL_COMMUNITY): Payer: PRIVATE HEALTH INSURANCE

## 2011-01-13 ENCOUNTER — Encounter (HOSPITAL_COMMUNITY): Payer: PRIVATE HEALTH INSURANCE

## 2011-01-16 ENCOUNTER — Encounter (HOSPITAL_COMMUNITY): Payer: PRIVATE HEALTH INSURANCE

## 2011-01-18 ENCOUNTER — Encounter (HOSPITAL_COMMUNITY): Payer: PRIVATE HEALTH INSURANCE

## 2011-01-18 ENCOUNTER — Ambulatory Visit: Payer: Medicare Other | Admitting: Internal Medicine

## 2011-01-20 ENCOUNTER — Encounter (HOSPITAL_COMMUNITY): Payer: PRIVATE HEALTH INSURANCE

## 2011-01-23 ENCOUNTER — Encounter (HOSPITAL_COMMUNITY): Payer: PRIVATE HEALTH INSURANCE

## 2011-01-25 ENCOUNTER — Encounter (HOSPITAL_COMMUNITY): Payer: PRIVATE HEALTH INSURANCE

## 2011-01-27 ENCOUNTER — Telehealth: Payer: Self-pay | Admitting: Internal Medicine

## 2011-01-27 ENCOUNTER — Inpatient Hospital Stay (HOSPITAL_COMMUNITY)
Admission: EM | Admit: 2011-01-27 | Discharge: 2011-02-02 | DRG: 378 | Disposition: A | Discharge status: Home / Self Care | Payer: Medicare Other | Attending: Endocrinology | Admitting: Endocrinology

## 2011-01-27 ENCOUNTER — Encounter (HOSPITAL_COMMUNITY): Payer: PRIVATE HEALTH INSURANCE

## 2011-01-27 DIAGNOSIS — N35919 Unspecified urethral stricture, male, unspecified site: Secondary | ICD-10-CM | POA: Diagnosis present

## 2011-01-27 DIAGNOSIS — J4489 Other specified chronic obstructive pulmonary disease: Secondary | ICD-10-CM | POA: Diagnosis present

## 2011-01-27 DIAGNOSIS — K648 Other hemorrhoids: Secondary | ICD-10-CM | POA: Diagnosis present

## 2011-01-27 DIAGNOSIS — Z7902 Long term (current) use of antithrombotics/antiplatelets: Secondary | ICD-10-CM

## 2011-01-27 DIAGNOSIS — I10 Essential (primary) hypertension: Secondary | ICD-10-CM | POA: Diagnosis present

## 2011-01-27 DIAGNOSIS — D62 Acute posthemorrhagic anemia: Secondary | ICD-10-CM | POA: Diagnosis present

## 2011-01-27 DIAGNOSIS — E785 Hyperlipidemia, unspecified: Secondary | ICD-10-CM | POA: Diagnosis present

## 2011-01-27 DIAGNOSIS — J449 Chronic obstructive pulmonary disease, unspecified: Secondary | ICD-10-CM | POA: Diagnosis present

## 2011-01-27 DIAGNOSIS — H543 Unqualified visual loss, both eyes: Secondary | ICD-10-CM | POA: Diagnosis present

## 2011-01-27 DIAGNOSIS — Z7982 Long term (current) use of aspirin: Secondary | ICD-10-CM

## 2011-01-27 DIAGNOSIS — I2589 Other forms of chronic ischemic heart disease: Secondary | ICD-10-CM | POA: Diagnosis present

## 2011-01-27 DIAGNOSIS — K8020 Calculus of gallbladder without cholecystitis without obstruction: Secondary | ICD-10-CM | POA: Diagnosis present

## 2011-01-27 DIAGNOSIS — I2510 Atherosclerotic heart disease of native coronary artery without angina pectoris: Secondary | ICD-10-CM | POA: Diagnosis present

## 2011-01-27 DIAGNOSIS — I252 Old myocardial infarction: Secondary | ICD-10-CM

## 2011-01-27 DIAGNOSIS — K5731 Diverticulosis of large intestine without perforation or abscess with bleeding: Principal | ICD-10-CM | POA: Diagnosis present

## 2011-01-27 LAB — URINE MICROSCOPIC-ADD ON

## 2011-01-27 LAB — URINALYSIS, ROUTINE W REFLEX MICROSCOPIC
Glucose, UA: NEGATIVE mg/dL
Specific Gravity, Urine: 1.005 (ref 1.005–1.030)

## 2011-01-27 LAB — CBC
HCT: 31.8 % — ABNORMAL LOW (ref 39.0–52.0)
Hemoglobin: 10.3 g/dL — ABNORMAL LOW (ref 13.0–17.0)
Hemoglobin: 9.4 g/dL — ABNORMAL LOW (ref 13.0–17.0)
MCHC: 33 g/dL (ref 30.0–36.0)
Platelets: 231 10*3/uL (ref 150–400)
RBC: 3.22 MIL/uL — ABNORMAL LOW (ref 4.22–5.81)
WBC: 8 10*3/uL (ref 4.0–10.5)

## 2011-01-27 LAB — TROPONIN I: Troponin I: <0.3 ng/mL (ref <0.30)

## 2011-01-27 LAB — BASIC METABOLIC PANEL
BUN: 16 mg/dL (ref 6–23)
GFR calc Af Amer: 80 mL/min — ABNORMAL LOW (ref >90)
GFR calc non Af Amer: 69 mL/min — ABNORMAL LOW (ref >90)
Potassium: 4.3 mEq/L (ref 3.5–5.1)
Sodium: 137 mEq/L (ref 135–145)

## 2011-01-27 LAB — CK TOTAL AND CKMB (NOT AT ARMC): Total CK: 137 U/L (ref 7–232)

## 2011-01-27 LAB — PHENYTOIN LEVEL, TOTAL: Phenytoin Lvl: 3 ug/mL — ABNORMAL LOW (ref 10.0–20.0)

## 2011-01-27 NOTE — Telephone Encounter (Signed)
Spoke with the daughter she will call me back if there are any more issues with scheduling.

## 2011-01-27 NOTE — Telephone Encounter (Signed)
Patient's daughter had some issues with setting up surgery with [facility].  I did ask her to contact [facility] again and if she then feels that they are not able to help then call back and we will ask Dr Marina Goodell at that time for a referral to another surgical group.  She agrees with this plan and will call back if she is still having issues.

## 2011-01-27 NOTE — Telephone Encounter (Signed)
That is the only surgical group in [place]. If needed, please call yourself to see what is the specific issue

## 2011-01-27 NOTE — Telephone Encounter (Signed)
The patient obviously needs to be evaluated, if he has had lower GI bleeding. This will be important to confirm and/ or workup prior to cholecystectomy. They have been instructed to go to the emergency room ASAP for this evaluation. The general surgeon, Dr. Biagio Quint, and I spoke this regard today.

## 2011-01-27 NOTE — Telephone Encounter (Signed)
Patient's daughter called back to report that he father has had diarrhea for the last week.  She also reports a large amount of bright red bleeding last night.  His daughter reports that it was a large amount of bleeding and had some dizziness this am, she reports that he took an imodium and has had no further diarrhea today.  She reports that " he has been out and about today" and she has spoke with him by phone, but not seen him today.  Discussed with Dr Marina Goodell patient needs an ER eval for lower GI bleed.  Family member verbalizes understanding.

## 2011-01-28 DIAGNOSIS — D649 Anemia, unspecified: Secondary | ICD-10-CM

## 2011-01-28 DIAGNOSIS — K922 Gastrointestinal hemorrhage, unspecified: Secondary | ICD-10-CM

## 2011-01-28 LAB — HEMOGLOBIN AND HEMATOCRIT, BLOOD
HCT: 29.6 % — ABNORMAL LOW (ref 39.0–52.0)
Hemoglobin: 9.8 g/dL — ABNORMAL LOW (ref 13.0–17.0)

## 2011-01-28 LAB — CBC
MCH: 29.4 pg (ref 26.0–34.0)
MCV: 87.2 fL (ref 78.0–100.0)
Platelets: 248 10*3/uL (ref 150–400)
RDW: 13.8 % (ref 11.5–15.5)
WBC: 8.3 10*3/uL (ref 4.0–10.5)

## 2011-01-28 LAB — COMPREHENSIVE METABOLIC PANEL
BUN: 14 mg/dL (ref 6–23)
CO2: 24 mEq/L (ref 19–32)
Chloride: 108 mEq/L (ref 96–112)
Creatinine, Ser: 1.02 mg/dL (ref 0.50–1.35)
GFR calc Af Amer: 81 mL/min — ABNORMAL LOW (ref >90)
GFR calc non Af Amer: 70 mL/min — ABNORMAL LOW (ref >90)
Total Bilirubin: 0.4 mg/dL (ref 0.3–1.2)

## 2011-01-28 LAB — CARDIAC PANEL(CRET KIN+CKTOT+MB+TROPI): Troponin I: <0.3 ng/mL (ref <0.30)

## 2011-01-28 LAB — ABO/RH: ABO/RH(D): O POS

## 2011-01-29 DIAGNOSIS — K922 Gastrointestinal hemorrhage, unspecified: Secondary | ICD-10-CM

## 2011-01-29 DIAGNOSIS — D649 Anemia, unspecified: Secondary | ICD-10-CM

## 2011-01-29 LAB — HEMOGLOBIN AND HEMATOCRIT, BLOOD
HCT: 26.9 % — ABNORMAL LOW (ref 39.0–52.0)
HCT: 28.4 % — ABNORMAL LOW (ref 39.0–52.0)
HCT: 24.1 % — ABNORMAL LOW (ref 39.0–52.0)
Hemoglobin: 9 g/dL — ABNORMAL LOW (ref 13.0–17.0)
Hemoglobin: 8.2 g/dL — ABNORMAL LOW (ref 13.0–17.0)

## 2011-01-29 LAB — CARDIAC PANEL(CRET KIN+CKTOT+MB+TROPI)
CK, MB: 3.6 ng/mL (ref 0.3–4.0)
Troponin I: <0.3 ng/mL (ref <0.30)

## 2011-01-29 LAB — CBC
Hemoglobin: 8.7 g/dL — ABNORMAL LOW (ref 13.0–17.0)
MCH: 29.6 pg (ref 26.0–34.0)
MCV: 87.8 fL (ref 78.0–100.0)
RBC: 2.94 MIL/uL — ABNORMAL LOW (ref 4.22–5.81)

## 2011-01-29 LAB — PROTIME-INR: INR: 1.19 (ref 0.00–1.49)

## 2011-01-30 ENCOUNTER — Encounter (HOSPITAL_COMMUNITY): Payer: PRIVATE HEALTH INSURANCE

## 2011-01-30 DIAGNOSIS — K922 Gastrointestinal hemorrhage, unspecified: Secondary | ICD-10-CM

## 2011-01-30 DIAGNOSIS — D649 Anemia, unspecified: Secondary | ICD-10-CM

## 2011-01-30 LAB — COMPREHENSIVE METABOLIC PANEL
ALT: 11 U/L (ref 0–53)
Calcium: 8.5 mg/dL (ref 8.4–10.5)
Creatinine, Ser: 0.94 mg/dL (ref 0.50–1.35)
GFR calc Af Amer: >90 mL/min (ref >90)
Glucose, Bld: 82 mg/dL (ref 70–99)
Sodium: 140 mEq/L (ref 135–145)
Total Protein: 5.8 g/dL — ABNORMAL LOW (ref 6.0–8.3)

## 2011-01-30 LAB — PROTIME-INR
INR: 1.18 (ref 0.00–1.49)
INR: 1.13 (ref 0.00–1.49)
Prothrombin Time: 15.4 seconds — ABNORMAL HIGH (ref 11.6–15.2)

## 2011-01-30 LAB — PREPARE RBC (CROSSMATCH)

## 2011-01-30 LAB — HEMOGLOBIN AND HEMATOCRIT, BLOOD
HCT: 25.9 % — ABNORMAL LOW (ref 39.0–52.0)
Hemoglobin: 8.7 g/dL — ABNORMAL LOW (ref 13.0–17.0)

## 2011-01-31 LAB — CROSSMATCH
ABO/RH(D): O POS
Antibody Screen: NEGATIVE
Unit division: 0

## 2011-01-31 LAB — COMPREHENSIVE METABOLIC PANEL
ALT: 10 U/L (ref 0–53)
AST: 15 U/L (ref 0–37)
CO2: 24 mEq/L (ref 19–32)
Calcium: 8.9 mg/dL (ref 8.4–10.5)
Chloride: 107 mEq/L (ref 96–112)
GFR calc non Af Amer: 80 mL/min — ABNORMAL LOW (ref >90)
Sodium: 138 mEq/L (ref 135–145)

## 2011-01-31 LAB — HEMOGLOBIN AND HEMATOCRIT, BLOOD: HCT: 27.4 % — ABNORMAL LOW (ref 39.0–52.0)

## 2011-02-01 ENCOUNTER — Other Ambulatory Visit: Payer: Self-pay | Admitting: Gastroenterology

## 2011-02-01 ENCOUNTER — Encounter (HOSPITAL_COMMUNITY): Payer: PRIVATE HEALTH INSURANCE

## 2011-02-01 DIAGNOSIS — K5730 Diverticulosis of large intestine without perforation or abscess without bleeding: Secondary | ICD-10-CM

## 2011-02-01 DIAGNOSIS — K648 Other hemorrhoids: Secondary | ICD-10-CM

## 2011-02-01 DIAGNOSIS — K922 Gastrointestinal hemorrhage, unspecified: Secondary | ICD-10-CM

## 2011-02-01 DIAGNOSIS — D126 Benign neoplasm of colon, unspecified: Secondary | ICD-10-CM

## 2011-02-01 LAB — CBC
Hemoglobin: 9.5 g/dL — ABNORMAL LOW (ref 13.0–17.0)
MCH: 28.9 pg (ref 26.0–34.0)
MCHC: 32.2 g/dL (ref 30.0–36.0)
MCV: 89.7 fL (ref 78.0–100.0)

## 2011-02-01 NOTE — H&P (Signed)
NAMEKAPONO, LUHN NO.:  [id number]  MEDICAL RECORD NO.:  [id number]  LOCATION:  3734                         FACILITY:  [facility]  PHYSICIAN:  Tera Mater. Evlyn Kanner, M.D. DATE OF BIRTH:  [birth year]  DATE OF ADMISSION:  01/27/2011 DATE OF DISCHARGE:  01/28/2011                             HISTORY & PHYSICAL   Mr. [name] is a 77 year old black male who presents with GI bleeding.  He has history of colon polyps about 10 years ago.  He has had 2 recent evaluations, 1 by GI for transaminase elevations and the other by surgery for asymptomatic cholelithiasis.  He has never had like this before.  This started at about 3:00 a.m. and he had a total of 5 bloody bowel movements.  He has had no pain with bowel movements but his stomach has cramped up a bit afterwards.  He has had no nausea and vomiting.  He had some grits this morning and then had some fish and some hushpuppies mid day. He has not eaten since then.  He has generally been feeling relatively well.  He notes no chest pain or breathing trouble.  His bowels have been working normal prior to this.  He just had a recent CAT scan as well noted.  He has been recently seen by his cardiologist and was doing relatively well with ischemic cardiomyopathy. At the present time, he has been found to be somewhat orthostatic with some early anemia noted.  PAST MEDICAL HISTORY:  Coronary artery disease with prior stent.  He has had a cardiac cath back in 2011 with apical akinesis and an EF of 20%. He was 20-25% on an echo.  He has history of a seizure disorder dating back to 2007.  He has history of hypertension, history of hyperlipidemia.  He has history of blindness. He has had hematuria.  He recently had some urethral trauma and has had acute MI in the past.  He has had elevated transaminases as noted, anemia in the past, COPD in the past.  MEDICATIONS: 1. Dilantin 100, 3 times a day. 2. Losartan 50 mg once daily. 3.  Lopressor 50 once daily. 4. Nitrostat. 5. Simvastatin 40. 6. Aspirin 325. 7. Plavix 75.  ALLERGIES TO MEDICATIONS:  Listed as none.  SOCIAL HISTORY:  He is widowed.  He does have children.  PHYSICAL EXAMINATION:  VITAL SIGNS:  Here in the emergency room, blood pressure initially 151/76, pulse 80, respirations 20.  When they did orthostatics, he was 148/96 lying, 155/81 standing but his pulse went from 70-101.  His O2 sat has been 97%.  Temperature is 98.1. GENERAL:  We have a thin elderly slightly unwell black male sitting up on the stretcher in no distress. HEENT:  Face is generally symmetrical.  Mucous membranes are moist. NECK:  Supple.  I cannot appreciate any bruits. LUNGS:  Clear. HEART:  Regular and distant with systolic murmur. ABDOMEN:  Nondistended.  He is slightly tender in the epigastrium and a bit below that.  No rebound is present.  Bowel sounds are hypoactive. EXTREMITIES:  Reveal pulses to be relatively well preserved with no edema. NEUROLOGIC:  The patient is awake.  He is alert.  His mentation appears fairly good.  Speech is clear.  No resting tremors present.  I did not stand and we tried to walk him.  LABORATORY DATA:  Here in the emergency room white count is 8000, hemoglobin 10.3, MCV 89.1, platelets 251,000.  Urinalysis did show 11-20 white cells.  Chemistry:  Sodium 137, potassium 4.3, chloride 108, CO2 22, glucose of 85, BUN 16, creatinine 1.03.  Estimated GFR of 80 and a calcium of 9.2.  In summary, we have a 77 year old black male presenting with 5 episodes of rectal bleeding.  The differential is fairly wide but include either a infectious colitis, inflammatory colitis, or possibly diverticulitis. I think it is less likely to be related to colonic polyps.  I think at the present time he is hemodynamically doing relatively well.  With his significant ischemic cardiomyopathy, however, I think we need to watch him on telemetry.  We are going to gently  hydrate him.  We are going to hold his aspirin and Plavix.  We are going to use compression stockings rather than Lovenox.  We will be checking his Dilantin level to make sure it is therapeutic.  Continue the Foley catheter.  Treat his urinary tract infection with Rocephin.  We will change him over to Cipro.  We will watch him carefully with serial labs.  I have talked to Dr. Wendall Papa, Dr. Marina Goodell, his partner who plans to see him.          ______________________________ Tera Mater. Evlyn Kanner, M.D.     SAS/MEDQ  D:  01/27/2011  T:  01/28/2011  Job:  [id number]  Electronically Signed by Adrian Prince M.D. on 02/01/2011 04:14:51 PM

## 2011-02-02 LAB — BASIC METABOLIC PANEL
Calcium: 8.8 mg/dL (ref 8.4–10.5)
Chloride: 107 mEq/L (ref 96–112)
Creatinine, Ser: 0.95 mg/dL (ref 0.50–1.35)
GFR calc Af Amer: >90 mL/min (ref >90)
GFR calc non Af Amer: 79 mL/min — ABNORMAL LOW (ref >90)

## 2011-02-02 LAB — CBC
MCH: 28.7 pg (ref 26.0–34.0)
MCHC: 32.2 g/dL (ref 30.0–36.0)
MCV: 89.3 fL (ref 78.0–100.0)
Platelets: 195 10*3/uL (ref 150–400)
RDW: 13.8 % (ref 11.5–15.5)
WBC: 5.2 10*3/uL (ref 4.0–10.5)

## 2011-02-03 ENCOUNTER — Encounter (HOSPITAL_COMMUNITY): Payer: PRIVATE HEALTH INSURANCE

## 2011-02-06 ENCOUNTER — Encounter (HOSPITAL_COMMUNITY): Payer: PRIVATE HEALTH INSURANCE

## 2011-02-08 ENCOUNTER — Encounter (HOSPITAL_COMMUNITY): Payer: PRIVATE HEALTH INSURANCE

## 2011-02-10 ENCOUNTER — Encounter (HOSPITAL_COMMUNITY): Payer: PRIVATE HEALTH INSURANCE

## 2011-02-13 ENCOUNTER — Encounter (HOSPITAL_COMMUNITY): Payer: PRIVATE HEALTH INSURANCE

## 2011-02-15 ENCOUNTER — Encounter (HOSPITAL_COMMUNITY): Payer: PRIVATE HEALTH INSURANCE

## 2011-02-17 ENCOUNTER — Encounter (HOSPITAL_COMMUNITY): Payer: PRIVATE HEALTH INSURANCE

## 2011-02-20 ENCOUNTER — Encounter (HOSPITAL_COMMUNITY): Payer: PRIVATE HEALTH INSURANCE

## 2011-02-22 ENCOUNTER — Encounter (HOSPITAL_COMMUNITY): Payer: PRIVATE HEALTH INSURANCE

## 2011-02-24 ENCOUNTER — Encounter (HOSPITAL_COMMUNITY): Payer: PRIVATE HEALTH INSURANCE

## 2011-02-27 ENCOUNTER — Encounter (HOSPITAL_COMMUNITY): Payer: PRIVATE HEALTH INSURANCE

## 2011-02-28 NOTE — Discharge Summary (Signed)
[name], [name] NO.:  [id number]  MEDICAL RECORD NO.:  [id number]  LOCATION:  3734                         FACILITY:  [facility]  PHYSICIAN:  Larina Earthly, M.D.        DATE OF BIRTH:  02-03-1933  DATE OF ADMISSION:  01/27/2011 DATE OF DISCHARGE:  02/02/2011                              DISCHARGE SUMMARY   DISCHARGE DIAGNOSES: 1. Lower gastrointestinal bleed, presumably diverticular versus     hemorrhoidal, now stable and asymptomatic with colonoscopy results     as below. 2. Cardiomyopathy with an ejection fraction of approximately 25-30%,     hemodynamically stable and clinically compensated. 3. Coronary artery disease with history of stent placement, on aspirin     and Plavix, hemodynamically stable with no exertional difficulties. 4. Seizure disorder with subtherapeutic Dilantin level during this     hospitalization with increase in Dilantin prior to discharge. 5. Acute blood loss anemia with reassessment of anemia on an     outpatient basis. 6. Urethral stricture with chronic indwelling Foley catheter. 7. Chronic obstructive pulmonary disease, asymptomatic. 8. History of cholelithiasis with a history of abnormal liver function     test without surgery performed.  SECONDARY DIAGNOSES: 1. Cholelithiasis. 2. Hypertension. 3. Hyperlipidemia. 4. History of blindness. 5. History of hematuria with urethral stricture. 6. History of acute myocardial infarction. 7. History of elevated transaminases. 8. History of anemia. 9. History of chronic obstructive pulmonary disease.  DISCHARGE MEDICATIONS: 1. Hydrocortisone suppositories 25 mg rectally twice daily for 7 days. 2. Dilantin 100 mg capsules 1 in the morning, 1 in the afternoon and 2     at bedtime. 3. Aspirin 325 mg daily. 4. Coreg 6.25 mg twice daily with meals. 5. Plavix 75 mg daily. 6. Lipitor 80 mg daily. 7. Losartan 50 mg p.o. b.i.d. 8. Nitroglycerin 0.4 mg sublingual q.5 minutes x3 as needed for  chest     pain. 9. Oxybutynin XL 15 mg p.o. daily. 10.Spironolactone 25 mg daily. 11.The patient given a prescription for the Dilantin as well as the     hydrocortisone suppositories.  PERTINENT LABORATORY FINDINGS:  Please note that patient's admission hemoglobin was 10.3, at discharge was 8.2 and this is after the administration of 3 units of packed red blood cells during hospitalization.  On discharge; white blood cell count 5.2, platelet count 195, sodium 139, potassium 3.8, serum CO2 24, BUN 10, creatinine 0.95, glucose 71, AST 15, ALT 10, alkaline phosphatase 46, total bilirubin 0.4, albumin 3.7.  Cardiac enzymes x3 negative.  Calcium 8.8. Pathology on colon polyp obtained during colonoscopy, was hyperplastic, 3 mm.  Urinalysis marginally positive, asymptomatic with indwelling chronic catheter.  PT 15.9 seconds.  Dilantin level was subtherapeutic at 3.0.  HISTORY OF PRESENT ILLNESS:  This is a 77 year old African American male with the above-mentioned medical history who presented with 5 bloody bowel movements without pain, but some post bowel movement cramping with multiple significant comorbidities.  He was relatively somewhat orthostatic upon admission and anemic and the patient was subsequently admitted by my partner Dr. Stephan Minister on January 27, 2011.  Please see his history and physical for significant details.  HOSPITAL COURSE:  Aspirin and  Plavix were held.  The patient was given IV fluids, and he had no further bloody bowel movements after hospitalization.  He remained hemodynamically stable.  He was afebrile. Did not have any nausea or vomiting and was provided mechanical DVT prophylaxis.  He was evaluated by [facility] in consultation.  They evaluated the patient, thought that he did warrant colonoscopy however they did want his aspirin and Plavix discontinued for approximately 5 days.  Prior to performing this colonoscopy, the patient remained hemodynamically  stable and remained in the hospital.  During this period of time colonoscopy findings performed on February 01, 2011, revealing some diverticulosis and small polyp and hemorrhoids, given the fact that the patient was no longer having any further bleeding and was hemodynamically stable, they thought it was okay to resume aspirin and Plavix and if he was to have another bleed, it would be helpful to do a stat flex sig to determine whether bleeding is due to hemorrhoids.  If so, he should have a band ligation per the report.  The patient was deemed appropriate for discharge on February 02, 2011.  Foley catheter was changed over to a leg bag which he chronically uses.  He was ambulated and thought appropriate for discharge with family.  He is to follow up with myself in approximately 10-14 days, at which time he will need reassessment of his renal parameters, electrolytes, CBC and iron profiles as well as Dilantin level given adjustment of his Dilantin level during this hospitalization.  If he has recurrent bleeding, he is to contact Dr. Marina Goodell and colleagues for emergent flex sig for further evaluation and management.  Greater than 30 minutes spent in discharge summary evaluating patient's labs, discharge needs and information as well physical exam.     Larina Earthly, M.D.     RA/MEDQ  D:  02/02/2011  T:  02/02/2011  Job:  [id number]  cc:   Dr. Milagros Reap, M.D.  Electronically Signed by Larina Earthly M.D. on 02/28/2011 11:04:51 AM

## 2011-03-01 ENCOUNTER — Encounter (HOSPITAL_COMMUNITY): Payer: PRIVATE HEALTH INSURANCE

## 2011-03-03 ENCOUNTER — Encounter (HOSPITAL_COMMUNITY): Payer: PRIVATE HEALTH INSURANCE

## 2011-03-06 ENCOUNTER — Encounter (HOSPITAL_COMMUNITY): Payer: PRIVATE HEALTH INSURANCE

## 2011-03-08 ENCOUNTER — Encounter (HOSPITAL_COMMUNITY): Payer: PRIVATE HEALTH INSURANCE

## 2011-03-10 ENCOUNTER — Encounter (HOSPITAL_COMMUNITY): Payer: PRIVATE HEALTH INSURANCE

## 2011-03-13 ENCOUNTER — Encounter (HOSPITAL_COMMUNITY): Payer: PRIVATE HEALTH INSURANCE

## 2011-03-15 ENCOUNTER — Encounter (HOSPITAL_COMMUNITY): Payer: PRIVATE HEALTH INSURANCE

## 2011-03-17 ENCOUNTER — Encounter (HOSPITAL_COMMUNITY): Payer: PRIVATE HEALTH INSURANCE

## 2011-03-20 ENCOUNTER — Encounter (HOSPITAL_COMMUNITY): Payer: PRIVATE HEALTH INSURANCE

## 2011-03-22 ENCOUNTER — Encounter (HOSPITAL_COMMUNITY): Payer: PRIVATE HEALTH INSURANCE

## 2011-03-22 ENCOUNTER — Other Ambulatory Visit: Payer: Self-pay | Admitting: Cardiovascular Disease

## 2011-03-24 ENCOUNTER — Encounter (HOSPITAL_COMMUNITY): Payer: PRIVATE HEALTH INSURANCE

## 2011-03-27 ENCOUNTER — Encounter (HOSPITAL_COMMUNITY): Payer: PRIVATE HEALTH INSURANCE

## 2011-03-29 ENCOUNTER — Encounter (HOSPITAL_COMMUNITY): Payer: PRIVATE HEALTH INSURANCE

## 2011-03-31 ENCOUNTER — Encounter (HOSPITAL_COMMUNITY): Payer: PRIVATE HEALTH INSURANCE

## 2011-04-03 ENCOUNTER — Encounter (HOSPITAL_COMMUNITY): Payer: PRIVATE HEALTH INSURANCE

## 2011-04-05 ENCOUNTER — Encounter (HOSPITAL_COMMUNITY): Payer: PRIVATE HEALTH INSURANCE

## 2011-04-06 ENCOUNTER — Emergency Department (HOSPITAL_COMMUNITY)
Admission: EM | Admit: 2011-04-06 | Discharge: 2011-04-06 | Disposition: A | Discharge status: Home / Self Care | Payer: Medicare Other | Attending: Emergency Medicine | Admitting: Emergency Medicine

## 2011-04-06 ENCOUNTER — Encounter (HOSPITAL_COMMUNITY): Payer: Self-pay | Admitting: Emergency Medicine

## 2011-04-06 DIAGNOSIS — I2510 Atherosclerotic heart disease of native coronary artery without angina pectoris: Secondary | ICD-10-CM | POA: Insufficient documentation

## 2011-04-06 DIAGNOSIS — J4489 Other specified chronic obstructive pulmonary disease: Secondary | ICD-10-CM | POA: Insufficient documentation

## 2011-04-06 DIAGNOSIS — N390 Urinary tract infection, site not specified: Secondary | ICD-10-CM | POA: Insufficient documentation

## 2011-04-06 DIAGNOSIS — R109 Unspecified abdominal pain: Secondary | ICD-10-CM | POA: Insufficient documentation

## 2011-04-06 DIAGNOSIS — I10 Essential (primary) hypertension: Secondary | ICD-10-CM | POA: Insufficient documentation

## 2011-04-06 DIAGNOSIS — I252 Old myocardial infarction: Secondary | ICD-10-CM | POA: Insufficient documentation

## 2011-04-06 DIAGNOSIS — R300 Dysuria: Secondary | ICD-10-CM | POA: Insufficient documentation

## 2011-04-06 DIAGNOSIS — G40909 Epilepsy, unspecified, not intractable, without status epilepticus: Secondary | ICD-10-CM | POA: Insufficient documentation

## 2011-04-06 DIAGNOSIS — J449 Chronic obstructive pulmonary disease, unspecified: Secondary | ICD-10-CM | POA: Insufficient documentation

## 2011-04-06 DIAGNOSIS — R339 Retention of urine, unspecified: Secondary | ICD-10-CM

## 2011-04-06 LAB — URINE MICROSCOPIC-ADD ON

## 2011-04-06 LAB — POCT I-STAT, CHEM 8
BUN: 17 mg/dL (ref 6–23)
Chloride: 107 mEq/L (ref 96–112)
HCT: 37 % — ABNORMAL LOW (ref 39.0–52.0)
Sodium: 142 mEq/L (ref 135–145)
TCO2: 22 mmol/L (ref 0–100)

## 2011-04-06 LAB — URINALYSIS, ROUTINE W REFLEX MICROSCOPIC
Bilirubin Urine: NEGATIVE
Ketones, ur: NEGATIVE mg/dL
Nitrite: POSITIVE — AB
Protein, ur: 100 mg/dL — AB

## 2011-04-06 MED ORDER — POTASSIUM CHLORIDE CRYS ER 20 MEQ PO TBCR
20.0000 meq | EXTENDED_RELEASE_TABLET | Freq: Once | ORAL | Status: AC
  Administered 2011-04-06: 20 meq via ORAL
  Filled 2011-04-06: qty 1

## 2011-04-06 MED ORDER — CEFPODOXIME PROXETIL 200 MG PO TABS: 200.0000 mg | ORAL_TABLET | Freq: Two times a day (BID) | ORAL | Status: DC

## 2011-04-06 MED ORDER — DEXTROSE 5 % IV SOLN
1.0000 g | Freq: Once | INTRAVENOUS | Status: AC
  Administered 2011-04-06: 1 g via INTRAVENOUS
  Filled 2011-04-06: qty 10

## 2011-04-06 NOTE — ED Notes (Signed)
PT. REQUESTING INDWELLING URINARY CATHETER CHANGE DUE TO OBSTRUCTION "CLOGGED" ONSET LAST NIGHT.

## 2011-04-06 NOTE — ED Provider Notes (Signed)
History     CSN: [id number]  Arrival date & time 04/06/11  6213   First MD Initiated Contact with Patient 04/06/11 0532      Chief Complaint  Patient presents with  . Urinary Retention    (Consider location/radiation/quality/duration/timing/severity/associated sxs/prior treatment) The history is provided by the patient.   onset today, gradually worsening. to the point of severe condition. Patient has had some recent dysuria and wears a Foley catheter followed by [facility]. No recent fevers or vomiting. Complains of lower abdominal discomfort and inability to void. No history of same. No alleviating factors. It is worse to touch the area over his bladder.  Past Medical History  Diagnosis Date  . Hypertension   . Acute MI Dec 2010    cardiac catheriziation and stenting of the LAD (bare metal) 03/14/09              . Seizure disorder     on dilantin  . Vision impairment   . Urethral stricture     with subsequent trauma and has an indwelling Foley catheter in place during his first MI                                                 . Anemia   . NSTEMI (non-ST elevated myocardial infarction) Nov 2011    with LV dysfunction  . LV dysfunction     EF 25 to 30% per echo May 2012  . CAD (coronary artery disease)   . Prostatic hypertrophy   . COPD (chronic obstructive pulmonary disease)     Past Surgical History  Procedure Date  . Coronary stent placement 03/14/2009    Stent to the LAD  . Cardiac catheterization 02/2010    Family History  Problem Relation Age of Onset  . Colon cancer Neg Hx     History  Substance Use Topics  . Smoking status: Former Smoker -- 1.0 packs/day for 50 years    Types: Cigarettes    Quit date: 08/22/2004  . Smokeless tobacco: Never Used  . Alcohol Use: No      Review of Systems  Constitutional: Negative for fever and chills.  HENT: Negative for neck pain and neck stiffness.   Eyes: Negative for pain.  Respiratory: Negative for  shortness of breath.   Cardiovascular: Negative for chest pain.  Gastrointestinal: Negative for abdominal pain.  Genitourinary: Positive for dysuria and difficulty urinating. Negative for hematuria, flank pain and discharge.  Musculoskeletal: Negative for back pain.  Skin: Negative for rash.  Neurological: Negative for headaches.  All other systems reviewed and are negative.    Allergies  Iodinated diagnostic agents  Home Medications   Reviewed.  BP 132/76  Pulse 98  Temp(Src) 97 F (36.1 C) (Oral)  Resp 18  SpO2 95%  Physical Exam  Constitutional: He is oriented to person, place, and time. He appears well-developed and well-nourished.  HENT:  Head: Normocephalic and atraumatic.  Eyes: Conjunctivae and EOM are normal. Pupils are equal, round, and reactive to light.  Neck: Trachea normal. Neck supple. No thyromegaly present.  Cardiovascular: Normal rate, regular rhythm, S1 normal, S2 normal and normal pulses.     No systolic murmur is present   No diastolic murmur is present  Pulses:      Radial pulses are 2+ on the right side, and 2+ on  the left side.  Pulmonary/Chest: Effort normal and breath sounds normal. He has no wheezes. He has no rhonchi. He has no rales. He exhibits no tenderness.  Abdominal: Soft. Normal appearance and bowel sounds are normal. There is no rebound, no guarding, no CVA tenderness and negative Murphy's sign.       Suprapubic fullness. Foley catheter in place  Musculoskeletal:       BLE:s Calves nontender, no cords or erythema, negative Homans sign  Neurological: He is alert and oriented to person, place, and time. He has normal strength. No cranial nerve deficit or sensory deficit. GCS eye subscore is 4. GCS verbal subscore is 5. GCS motor subscore is 6.  Skin: Skin is warm and dry. No rash noted. He is not diaphoretic.  Psychiatric: His speech is normal.       Cooperative and appropriate    ED Course  Procedures (including critical care  time)  Labs Reviewed  URINALYSIS, ROUTINE W REFLEX MICROSCOPIC - Abnormal; Notable for the following:    APPearance TURBID (*)    Hgb urine dipstick LARGE (*)    Protein, ur 100 (*)    Nitrite POSITIVE (*)    Leukocytes, UA MODERATE (*)    All other components within normal limits  POCT I-STAT, CHEM 8 - Abnormal; Notable for the following:    Potassium 3.4 (*)    Glucose, Bld 120 (*)    Hemoglobin 12.6 (*)    HCT 37.0 (*)    All other components within normal limits  URINE MICROSCOPIC-ADD ON - Abnormal; Notable for the following:    Bacteria, UA MANY (*)    Crystals TRIPLE PHOSPHATE CRYSTALS (*)    All other components within normal limits  I-STAT, CHEM 8  URINE CULTURE   Significant relief with replacement of Foley catheter and urine output.   MDM   Presentation consistent with urinary retention. Foley cath replaced and thick cloudy urine about 500 cc urine output. UA reviewed as above consistent with UTI. IV Rocephin given and plan discharge home with antibiotics. Patient is followed by Dr.Ottelin and has scheduled followup next week. Urine culture sent        Sunnie Nielsen, MD 04/06/11 [phone]

## 2011-04-07 ENCOUNTER — Encounter (HOSPITAL_COMMUNITY): Payer: PRIVATE HEALTH INSURANCE

## 2011-04-07 LAB — URINE CULTURE

## 2011-04-08 NOTE — ED Notes (Signed)
+   Urine culture. Treated with Vantin, sensitive to same per protocol MD. 

## 2011-04-09 ENCOUNTER — Encounter (HOSPITAL_COMMUNITY): Payer: Self-pay | Admitting: *Deleted

## 2011-04-09 ENCOUNTER — Emergency Department (HOSPITAL_COMMUNITY): Payer: Medicare Other

## 2011-04-09 ENCOUNTER — Other Ambulatory Visit: Payer: Self-pay

## 2011-04-09 ENCOUNTER — Emergency Department (HOSPITAL_COMMUNITY)
Admission: EM | Admit: 2011-04-09 | Discharge: 2011-04-10 | Disposition: A | Discharge status: Home / Self Care | Payer: Medicare Other | Attending: Emergency Medicine | Admitting: Emergency Medicine

## 2011-04-09 DIAGNOSIS — J4489 Other specified chronic obstructive pulmonary disease: Secondary | ICD-10-CM | POA: Insufficient documentation

## 2011-04-09 DIAGNOSIS — Z7982 Long term (current) use of aspirin: Secondary | ICD-10-CM | POA: Insufficient documentation

## 2011-04-09 DIAGNOSIS — I252 Old myocardial infarction: Secondary | ICD-10-CM | POA: Insufficient documentation

## 2011-04-09 DIAGNOSIS — I2510 Atherosclerotic heart disease of native coronary artery without angina pectoris: Secondary | ICD-10-CM | POA: Insufficient documentation

## 2011-04-09 DIAGNOSIS — R42 Dizziness and giddiness: Secondary | ICD-10-CM | POA: Insufficient documentation

## 2011-04-09 DIAGNOSIS — R269 Unspecified abnormalities of gait and mobility: Secondary | ICD-10-CM | POA: Insufficient documentation

## 2011-04-09 DIAGNOSIS — J449 Chronic obstructive pulmonary disease, unspecified: Secondary | ICD-10-CM | POA: Insufficient documentation

## 2011-04-09 DIAGNOSIS — I10 Essential (primary) hypertension: Secondary | ICD-10-CM | POA: Insufficient documentation

## 2011-04-09 DIAGNOSIS — G40909 Epilepsy, unspecified, not intractable, without status epilepticus: Secondary | ICD-10-CM | POA: Insufficient documentation

## 2011-04-09 DIAGNOSIS — Z9889 Other specified postprocedural states: Secondary | ICD-10-CM | POA: Insufficient documentation

## 2011-04-09 DIAGNOSIS — Z79899 Other long term (current) drug therapy: Secondary | ICD-10-CM | POA: Insufficient documentation

## 2011-04-09 LAB — DIFFERENTIAL
Eosinophils Relative: 4 % (ref 0–5)
Lymphocytes Relative: 15 % (ref 12–46)
Lymphs Abs: 0.8 10*3/uL (ref 0.7–4.0)
Neutro Abs: 3.6 10*3/uL (ref 1.7–7.7)

## 2011-04-09 LAB — URINALYSIS, ROUTINE W REFLEX MICROSCOPIC
Hgb urine dipstick: NEGATIVE
Nitrite: NEGATIVE
Specific Gravity, Urine: 1.006 (ref 1.005–1.030)
Urobilinogen, UA: 0.2 mg/dL (ref 0.0–1.0)

## 2011-04-09 LAB — POCT I-STAT TROPONIN I: Troponin i, poc: 0.04 ng/mL (ref 0.00–0.08)

## 2011-04-09 LAB — CBC
HCT: 31.3 % — ABNORMAL LOW (ref 39.0–52.0)
Hemoglobin: 10.2 g/dL — ABNORMAL LOW (ref 13.0–17.0)
MCH: 28.1 pg (ref 26.0–34.0)
MCHC: 32.6 g/dL (ref 30.0–36.0)
RDW: 15.1 % (ref 11.5–15.5)

## 2011-04-09 NOTE — ED Notes (Signed)
He has had some dizziness since he ate bacon and eggs approx  1300 today.

## 2011-04-09 NOTE — ED Provider Notes (Signed)
8:08 PM  78 yo w dizzyness and improved ataxia. CT showed chronic changes periventricular white matter disease and remote white matter infarcts. Dr, Felipa Eth pts PCP consulted by [facility] PA-C who recommends MRI. If no acute findings on MRI and pt able to ambulate he is comfortable w dc. Dr. Felipa Eth as OP by Tuesday.   Current UTI being tx  Med Hx: HTN, MI, COPD, BPH, seizure  10:00 PM Pt wanted to leave AMA for concern of MRI cost. Discussed in detail that his doctor recommended the study to r/o posterior stroke (s/s being ataxia & dizziness). Pt has agreed to stay. No current complaints.   12:12 AM Pt sleeping. VSS.   [place], [place] 04/10/11 [phone]

## 2011-04-09 NOTE — ED Provider Notes (Signed)
History     CSN: [id number]  Arrival date & time 04/09/11  1636   First MD Initiated Contact with Patient 04/09/11 1721      Chief Complaint  Patient presents with  . Dizziness   HPI Patient presents to the emergency room with complaint of dizziness that started around noon today. Patient reports that he was fine after church and then developed some unsteadiness this afternoon. Patient denies any headache, fevers, chills, nausea, vomiting, chest pain, SOB. Patient is currently being treated with Vantin for urinary tract infection, he was seen a few days prior for urinary retention.   Past Medical History  Diagnosis Date  . Hypertension   . Acute MI Dec 2010    cardiac catheriziation and stenting of the LAD (bare metal) 03/14/09              . Seizure disorder     on dilantin  . Vision impairment   . Urethral stricture     with subsequent trauma and has an indwelling Foley catheter in place during his first MI                                                 . Anemia   . NSTEMI (non-ST elevated myocardial infarction) Nov 2011    with LV dysfunction  . LV dysfunction     EF 25 to 30% per echo May 2012  . CAD (coronary artery disease)   . Prostatic hypertrophy   . COPD (chronic obstructive pulmonary disease)     Past Surgical History  Procedure Date  . Coronary stent placement 03/14/2009    Stent to the LAD  . Cardiac catheterization 02/2010    Family History  Problem Relation Age of Onset  . Colon cancer Neg Hx     History  Substance Use Topics  . Smoking status: Former Smoker -- 1.0 packs/day for 50 years    Types: Cigarettes    Quit date: 08/22/2004  . Smokeless tobacco: Never Used  . Alcohol Use: No      Review of Systems  Allergies  Iodinated diagnostic agents  Home Medications   Current Outpatient Rx  Name Route Sig Dispense Refill  . ASPIRIN 325 MG PO TABS Oral Take 325 mg by mouth daily.      . ATORVASTATIN CALCIUM 80 MG PO TABS Oral Take 80 mg  by mouth at bedtime.      Marland Kitchen CARVEDILOL 12.5 MG PO TABS Oral Take 1 tablet (12.5 mg total) by mouth 2 (two) times daily. 60 tablet 11  . CEFPODOXIME PROXETIL 200 MG PO TABS Oral Take 200 mg by mouth 2 (two) times daily.      Marland Kitchen CLOPIDOGREL BISULFATE 75 MG PO TABS Oral Take 75 mg by mouth daily.      Marland Kitchen LOSARTAN POTASSIUM 100 MG PO TABS Oral Take 1 tablet (100 mg total) by mouth daily. 30 tablet 11  . NITROGLYCERIN 0.4 MG/SPRAY TL SOLN Sublingual Place 1 spray under the tongue every 5 (five) minutes as needed. For chest pain     . OXYBUTYNIN CHLORIDE ER 15 MG PO TB24 Oral Take 15 mg by mouth daily.      Marland Kitchen PHENYTOIN SODIUM EXTENDED 100 MG PO CAPS Oral Take by mouth 3 (three) times daily.      Marland Kitchen SPIRONOLACTONE 25 MG PO TABS  Oral Take 25 mg by mouth daily.        BP 174/111  Pulse 88  Temp 97.7 F (36.5 C)  Resp 22  SpO2 97%  Physical Exam  ED Course  Procedures (including critical care time)  Patient seen and evaluated.  VSS reviewed. . Nursing notes reviewed. Discussed with attending physician, Dr. Radford Pax. Who recommended getting MRI of his brain to r/o posterior stroke. Denies that he needs to be on the TIA observation protocol. Initial testing ordered. Will monitor the patient closely. They agree with the treatment plan and diagnosis.   8:19 PM spoke to Dr. Evlyn Kanner with [facility] who recommended getting the MRI, if normal have him follow up in the office for further workup.  Discussed with Drue Novel, PA-C for further workup.   MDM  Dizziness        Demetrius Charity, [place] 04/09/11 2120

## 2011-04-09 NOTE — ED Notes (Signed)
I placed a foley bag on the patients catheter. He only had his leg bag with him.

## 2011-04-09 NOTE — ED Notes (Signed)
Received report assumed patient care,  Patient from pda12 to cdu 8.

## 2011-04-10 ENCOUNTER — Encounter (HOSPITAL_COMMUNITY): Payer: PRIVATE HEALTH INSURANCE

## 2011-04-10 ENCOUNTER — Emergency Department (HOSPITAL_COMMUNITY): Payer: Medicare Other

## 2011-04-10 MED ORDER — HYDROMORPHONE HCL PF 1 MG/ML IJ SOLN
0.5000 mg | Freq: Once | INTRAMUSCULAR | Status: AC
  Administered 2011-04-10: 0.5 mg via INTRAVENOUS
  Filled 2011-04-10: qty 1

## 2011-04-10 MED ORDER — LORAZEPAM 2 MG/ML IJ SOLN
1.0000 mg | Freq: Once | INTRAMUSCULAR | Status: AC
  Administered 2011-04-10: 1 mg via INTRAVENOUS
  Filled 2011-04-10: qty 1

## 2011-04-10 NOTE — ED Notes (Signed)
Pt transported to MRI 

## 2011-04-10 NOTE — ED Notes (Signed)
Pt ambulated in dept. States he feels better. States he "feels kinda off balance" gait is steady

## 2011-04-10 NOTE — ED Notes (Signed)
Report given to Annette, RN 

## 2011-04-10 NOTE — ED Provider Notes (Signed)
8:02 AM Patient is in CDU holding for brain MRI this morning.  Daughter (POA and caregiver) is bedside.  Discussed all results with daughter.  Patient is eating breakfast, no complaints or needs at this time.  Plan is for MRI results - if normal, will ambulate patient.  If patient able to ambulate and MRI shows nothing acute, plan is for outpatient follow up tomorrow with Dr Felipa Eth.    9:56 AM Patient ambulated in the hallway with tech, initially holding on the the wall for support and then completely independently.    Patient states he felt a little woozy initially but then felt much better walking.  Ready for d/c home.  Patient to follow up tomorrow with Dr Felipa Eth.    Rise Patience, [place] 04/10/11 1610

## 2011-04-10 NOTE — ED Notes (Signed)
Pt set up to eat breakfast. He is drinking fluids without complaint of nausea. States he does not feel dizzy at this time.

## 2011-04-10 NOTE — ED Notes (Signed)
Pt family has arrived and asking multiple questions. Pa in to discuss results and plan of care with pt

## 2011-04-10 NOTE — ED Notes (Signed)
Mri called states pt is c/o dizziness and inability to lay flat. Pt medicated per orders.

## 2011-04-10 NOTE — ED Notes (Signed)
Pt is in mri at this time

## 2011-04-10 NOTE — ED Notes (Signed)
Pt sleeping resp even and unlabored, no distress noted. Monitors in place.

## 2011-04-11 LAB — URINE CULTURE

## 2011-04-12 ENCOUNTER — Encounter (HOSPITAL_COMMUNITY): Payer: PRIVATE HEALTH INSURANCE

## 2011-04-14 ENCOUNTER — Encounter (HOSPITAL_COMMUNITY): Payer: PRIVATE HEALTH INSURANCE

## 2011-04-17 ENCOUNTER — Encounter (HOSPITAL_COMMUNITY): Payer: PRIVATE HEALTH INSURANCE

## 2011-04-19 ENCOUNTER — Encounter (HOSPITAL_COMMUNITY): Payer: PRIVATE HEALTH INSURANCE

## 2011-04-21 ENCOUNTER — Encounter (HOSPITAL_COMMUNITY): Payer: PRIVATE HEALTH INSURANCE

## 2011-04-21 NOTE — ED Provider Notes (Signed)
Medical screening examination/treatment/procedure(s) were performed by non-physician practitioner and as supervising physician I was immediately available for consultation/collaboration.    Nelia Shi, MD 04/21/11 [phone]

## 2011-04-21 NOTE — ED Provider Notes (Signed)
Medical screening examination/treatment/procedure(s) were performed by non-physician practitioner and as supervising physician I was immediately available for consultation/collaboration.    Cameron Katayama L Monasia Lair, MD 04/21/11 2246 

## 2011-04-21 NOTE — ED Provider Notes (Signed)
Medical screening examination/treatment/procedure(s) were performed by non-physician practitioner and as supervising physician I was immediately available for consultation/collaboration.    Nelia Shi, MD 04/21/11 [phone]

## 2011-04-22 ENCOUNTER — Emergency Department (HOSPITAL_COMMUNITY): Payer: Medicare Other

## 2011-04-22 ENCOUNTER — Encounter (HOSPITAL_COMMUNITY): Payer: Self-pay | Admitting: *Deleted

## 2011-04-22 ENCOUNTER — Emergency Department (HOSPITAL_COMMUNITY)
Admission: EM | Admit: 2011-04-22 | Discharge: 2011-04-22 | Disposition: A | Discharge status: Home / Self Care | Payer: Medicare Other | Attending: Emergency Medicine | Admitting: Emergency Medicine

## 2011-04-22 DIAGNOSIS — J4489 Other specified chronic obstructive pulmonary disease: Secondary | ICD-10-CM | POA: Insufficient documentation

## 2011-04-22 DIAGNOSIS — N390 Urinary tract infection, site not specified: Secondary | ICD-10-CM

## 2011-04-22 DIAGNOSIS — Z7982 Long term (current) use of aspirin: Secondary | ICD-10-CM | POA: Insufficient documentation

## 2011-04-22 DIAGNOSIS — K8020 Calculus of gallbladder without cholecystitis without obstruction: Secondary | ICD-10-CM | POA: Insufficient documentation

## 2011-04-22 DIAGNOSIS — Z79899 Other long term (current) drug therapy: Secondary | ICD-10-CM | POA: Insufficient documentation

## 2011-04-22 DIAGNOSIS — I2510 Atherosclerotic heart disease of native coronary artery without angina pectoris: Secondary | ICD-10-CM | POA: Insufficient documentation

## 2011-04-22 DIAGNOSIS — R1011 Right upper quadrant pain: Secondary | ICD-10-CM | POA: Insufficient documentation

## 2011-04-22 DIAGNOSIS — I10 Essential (primary) hypertension: Secondary | ICD-10-CM | POA: Insufficient documentation

## 2011-04-22 DIAGNOSIS — J449 Chronic obstructive pulmonary disease, unspecified: Secondary | ICD-10-CM | POA: Insufficient documentation

## 2011-04-22 DIAGNOSIS — I252 Old myocardial infarction: Secondary | ICD-10-CM | POA: Insufficient documentation

## 2011-04-22 DIAGNOSIS — G40909 Epilepsy, unspecified, not intractable, without status epilepticus: Secondary | ICD-10-CM | POA: Insufficient documentation

## 2011-04-22 LAB — COMPREHENSIVE METABOLIC PANEL
ALT: 22 U/L (ref 0–53)
Alkaline Phosphatase: 73 U/L (ref 39–117)
BUN: 15 mg/dL (ref 6–23)
CO2: 24 mEq/L (ref 19–32)
Calcium: 8.8 mg/dL (ref 8.4–10.5)
GFR calc Af Amer: >90 mL/min (ref >90)
GFR calc non Af Amer: 80 mL/min — ABNORMAL LOW (ref >90)
Glucose, Bld: 97 mg/dL (ref 70–99)
Total Protein: 6.5 g/dL (ref 6.0–8.3)

## 2011-04-22 LAB — CBC
HCT: 30 % — ABNORMAL LOW (ref 39.0–52.0)
Hemoglobin: 10 g/dL — ABNORMAL LOW (ref 13.0–17.0)
MCH: 28.9 pg (ref 26.0–34.0)
MCV: 86.7 fL (ref 78.0–100.0)
RBC: 3.46 MIL/uL — ABNORMAL LOW (ref 4.22–5.81)
WBC: 7.7 10*3/uL (ref 4.0–10.5)

## 2011-04-22 LAB — URINALYSIS, ROUTINE W REFLEX MICROSCOPIC
Bilirubin Urine: NEGATIVE
Ketones, ur: NEGATIVE mg/dL
Nitrite: POSITIVE — AB
Specific Gravity, Urine: 1.01 (ref 1.005–1.030)
Urobilinogen, UA: 2 mg/dL — ABNORMAL HIGH (ref 0.0–1.0)
pH: 7 (ref 5.0–8.0)

## 2011-04-22 LAB — DIFFERENTIAL
Eosinophils Absolute: 0.1 10*3/uL (ref 0.0–0.7)
Eosinophils Relative: 1 % (ref 0–5)
Lymphocytes Relative: 7 % — ABNORMAL LOW (ref 12–46)
Lymphs Abs: 0.5 10*3/uL — ABNORMAL LOW (ref 0.7–4.0)
Monocytes Relative: 8 % (ref 3–12)

## 2011-04-22 LAB — URINE MICROSCOPIC-ADD ON

## 2011-04-22 LAB — URINE CULTURE: Culture  Setup Time: 201301191159

## 2011-04-22 MED ORDER — CIPROFLOXACIN HCL 500 MG PO TABS: 500.0000 mg | ORAL_TABLET | Freq: Two times a day (BID) | ORAL | Status: AC

## 2011-04-22 MED ORDER — CIPROFLOXACIN HCL 500 MG PO TABS
500.0000 mg | ORAL_TABLET | Freq: Once | ORAL | Status: AC
  Administered 2011-04-22: 500 mg via ORAL
  Filled 2011-04-22: qty 1

## 2011-04-22 NOTE — ED Notes (Signed)
Pt to ED c/o slow onset LUQ and LLQ abd pain.  Pain started before he went to bed and then woke him up around 2 am.  Pt took a nitro with no relief and chewed a 325mg  asa.  After he chewed the asa the pain went away.   No pain presently.    Pt has a urinary catheter and leg bag.

## 2011-04-22 NOTE — ED Notes (Signed)
EMS to pt home for "chest pain".  When EMS arrived pt stated RLQ that radiated to RUQ pain.  12 lead EKG unremarkable.

## 2011-04-22 NOTE — ED Provider Notes (Signed)
History     CSN: [id number]  Arrival date & time 04/22/11  0248   First MD Initiated Contact with Patient 04/22/11 0259      Chief Complaint  Patient presents with  . Abdominal Pain    RUQ and RLQ pain    (Consider location/radiation/quality/duration/timing/severity/associated sxs/prior treatment) Patient is a 78 y.o. male presenting with abdominal pain. The history is provided by the patient.  Abdominal Pain The primary symptoms of the illness include abdominal pain. The primary symptoms of the illness do not include shortness of breath, nausea, vomiting or dysuria. The current episode started less than 1 hour ago. The onset of the illness was sudden. The problem has been resolved.  The abdominal pain is located in the RUQ. The abdominal pain radiates to the RLQ. The severity of the abdominal pain is 6/10. The abdominal pain is relieved by belching. Exacerbated by: Nothing.  The patient has not had a change in bowel habit. Risk factors for an acute abdominal problem include being elderly. Symptoms associated with the illness do not include anorexia, diaphoresis, heartburn, constipation or back pain. Associated medical issues comments: Indwelling Foley cath.    Past Medical History  Diagnosis Date  . Hypertension   . Acute MI Dec 2010    cardiac catheriziation and stenting of the LAD (bare metal) 03/14/09              . Seizure disorder     on dilantin  . Vision impairment   . Urethral stricture     with subsequent trauma and has an indwelling Foley catheter in place during his first MI                                                 . Anemia   . NSTEMI (non-ST elevated myocardial infarction) Nov 2011    with LV dysfunction  . LV dysfunction     EF 25 to 30% per echo May 2012  . CAD (coronary artery disease)   . Prostatic hypertrophy   . COPD (chronic obstructive pulmonary disease)     Past Surgical History  Procedure Date  . Coronary stent placement 03/14/2009    Stent  to the LAD  . Cardiac catheterization 02/2010    Family History  Problem Relation Age of Onset  . Colon cancer Neg Hx     History  Substance Use Topics  . Smoking status: Former Smoker -- 1.0 packs/day for 50 years    Types: Cigarettes    Quit date: 08/22/2004  . Smokeless tobacco: Never Used  . Alcohol Use: No      Review of Systems  Constitutional: Negative for diaphoresis.  Respiratory: Negative for cough, chest tightness and shortness of breath.   Cardiovascular: Negative for chest pain.  Gastrointestinal: Positive for abdominal pain. Negative for heartburn, nausea, vomiting, constipation and anorexia.  Genitourinary: Negative for dysuria.  Musculoskeletal: Negative for back pain.  All other systems reviewed and are negative.    Allergies  Iodinated diagnostic agents  Home Medications   Current Outpatient Rx  Name Route Sig Dispense Refill  . ASPIRIN 325 MG PO TABS Oral Take 325 mg by mouth daily.      . ATORVASTATIN CALCIUM 80 MG PO TABS Oral Take 80 mg by mouth at bedtime.      Marland Kitchen CARVEDILOL 12.5 MG PO TABS  Oral Take 1 tablet (12.5 mg total) by mouth 2 (two) times daily. 60 tablet 11  . CLOPIDOGREL BISULFATE 75 MG PO TABS Oral Take 75 mg by mouth daily.      Marland Kitchen FERROUS SULFATE 325 (65 FE) MG PO TABS Oral Take 325 mg by mouth daily with breakfast.    . LOSARTAN POTASSIUM 100 MG PO TABS Oral Take 1 tablet (100 mg total) by mouth daily. 30 tablet 11  . NITROGLYCERIN 0.4 MG/SPRAY TL SOLN Sublingual Place 1 spray under the tongue every 5 (five) minutes as needed. For chest pain     . OXYBUTYNIN CHLORIDE ER 15 MG PO TB24 Oral Take 15 mg by mouth daily.      Marland Kitchen PHENYTOIN SODIUM EXTENDED 100 MG PO CAPS Oral Take 100 mg by mouth 3 (three) times daily.     Marland Kitchen SPIRONOLACTONE 25 MG PO TABS Oral Take 25 mg by mouth daily.        There were no vitals taken for this visit.  Physical Exam  Nursing note and vitals reviewed. Constitutional: He is oriented to person, place, and  time. He appears well-developed and well-nourished. No distress.  HENT:  Head: Normocephalic and atraumatic.  Mouth/Throat: Oropharynx is clear and moist.  Eyes: Conjunctivae and EOM are normal. Pupils are equal, round, and reactive to light.  Neck: Normal range of motion. Neck supple.  Cardiovascular: Normal rate, regular rhythm and intact distal pulses.   No murmur heard. Pulmonary/Chest: Effort normal and breath sounds normal. No respiratory distress. He has no wheezes. He has no rales.  Abdominal: Soft. He exhibits no distension. There is tenderness in the right upper quadrant. There is no rebound and no guarding.    Musculoskeletal: Normal range of motion. He exhibits no edema and no tenderness.  Neurological: He is alert and oriented to person, place, and time.  Skin: Skin is warm and dry. No rash noted. No erythema.  Psychiatric: He has a normal mood and affect. His behavior is normal.    ED Course  Procedures (including critical care time)  Labs Reviewed  CBC - Abnormal; Notable for the following:    RBC 3.46 (*)    Hemoglobin 10.0 (*)    HCT 30.0 (*)    RDW 15.6 (*)    All other components within normal limits  DIFFERENTIAL - Abnormal; Notable for the following:    Neutrophils Relative 83 (*)    Lymphocytes Relative 7 (*)    Lymphs Abs 0.5 (*)    All other components within normal limits  COMPREHENSIVE METABOLIC PANEL  URINALYSIS, ROUTINE W REFLEX MICROSCOPIC  URINE CULTURE   US Abdomen Complete  04/22/2011  *RADIOLOGY REPORT*  Clinical Data:  Right upper quadrant pain  COMPLETE ABDOMINAL ULTRASOUND  Comparison:  11/08/2010  Findings:  Gallbladder:  There are gallstones within the gallbladder. Gallbladder wall is thickened at 5 mm.  Small amount of pericholecystic fluid.  Negative sonographic Murphy's.  Common bile duct:   Normal caliber, 5 mm.  Liver:  No focal lesion identified.  Within normal limits in parenchymal echogenicity.  IVC:  Appears normal.  Pancreas:  No  focal abnormality seen.  Spleen:  Within normal limits in size and echotexture.  Right Kidney:   1.5 cm mid pole cyst. No hydronephrosis.  Left Kidney:  2 cysts visualized, the largest measure up to 4.2 cm. No hydronephrosis.  Abdominal aorta:  No aneurysm identified.  IMPRESSION: Cholelithiasis.  Mild gallbladder wall and pericholecystic fluid. No sonographic Murphy's sign.  Bilateral renal cysts.  Original Report Authenticated By: Cyndie Chime, M.D.     1. UTI (lower urinary tract infection)   2. Cholelithiasis       MDM   Patient here with a complaint of abdominal pain. He states it was in the right side of the abdomen in the upper and lower quadrant stating that it felt like there was gas. It started this evening but has now resolved he just wants to be checked out. He denied any vomiting but states he did have some bladder spasm earlier and took the medication and does have a chronic Foley catheter. Patient is well-appearing on exam without any physical exam findings currently except maybe for some mild right upper quadrant tenderness.  Concern for possible cholelithiasis versus constipation however patient's last normal bowel movement was yesterday. Also concern for possible UTI with bladder spasm and history of a chronic indwelling Foley catheter. CBC, CMP, UA with culture pending. Ultrasound of the right upper are pending.  5:44 AM Normal limits except for UA suggestive of a UTI. Urine culture and will get antibiotics.  U/s with cholelithiasis but no cholecystitis.  Pt states he knows he has gallbladder issues but they didn't want to take it out due to being on plavix.  Pt is pain free now and feel he can f/u as an outpt.  Also due to bladder spasm and WBC and leukocytes will treat for infection.  Will give Cipro as his last urine culture was sensitive to Cipro.      Gwyneth Sprout, MD 04/22/11 [phone]

## 2011-04-22 NOTE — ED Notes (Signed)
Pt given breakfast.

## 2011-04-22 NOTE — ED Notes (Signed)
Pt assisted in emptying foley bag and is now to [place].

## 2011-04-22 NOTE — ED Notes (Signed)
Pt back from us

## 2011-04-24 ENCOUNTER — Encounter (HOSPITAL_COMMUNITY): Payer: PRIVATE HEALTH INSURANCE

## 2011-04-26 ENCOUNTER — Encounter (HOSPITAL_COMMUNITY): Payer: PRIVATE HEALTH INSURANCE

## 2011-04-28 ENCOUNTER — Encounter (HOSPITAL_COMMUNITY): Payer: PRIVATE HEALTH INSURANCE

## 2011-05-01 ENCOUNTER — Encounter (HOSPITAL_COMMUNITY): Payer: PRIVATE HEALTH INSURANCE

## 2011-05-03 ENCOUNTER — Encounter (HOSPITAL_COMMUNITY): Payer: PRIVATE HEALTH INSURANCE

## 2011-05-05 ENCOUNTER — Encounter (HOSPITAL_COMMUNITY): Payer: PRIVATE HEALTH INSURANCE

## 2011-05-08 ENCOUNTER — Encounter (HOSPITAL_COMMUNITY): Payer: PRIVATE HEALTH INSURANCE

## 2011-05-10 ENCOUNTER — Encounter (HOSPITAL_COMMUNITY): Payer: PRIVATE HEALTH INSURANCE

## 2011-05-11 ENCOUNTER — Emergency Department (HOSPITAL_COMMUNITY): Payer: Medicare Other

## 2011-05-11 ENCOUNTER — Other Ambulatory Visit: Payer: Self-pay

## 2011-05-11 ENCOUNTER — Encounter (HOSPITAL_COMMUNITY): Payer: Self-pay

## 2011-05-11 ENCOUNTER — Observation Stay (HOSPITAL_COMMUNITY)
Admission: EM | Admit: 2011-05-11 | Discharge: 2011-05-12 | Disposition: A | Discharge status: Home / Self Care | Payer: Medicare Other | Attending: Cardiology | Admitting: Cardiology

## 2011-05-11 ENCOUNTER — Ambulatory Visit (INDEPENDENT_AMBULATORY_CARE_PROVIDER_SITE_OTHER): Payer: Medicare Other | Admitting: General Surgery

## 2011-05-11 VITALS — BP 140/84 | HR 80 | Temp 98.6°F | Resp 18 | Wt 141.0 lb

## 2011-05-11 DIAGNOSIS — I110 Hypertensive heart disease with heart failure: Secondary | ICD-10-CM | POA: Insufficient documentation

## 2011-05-11 DIAGNOSIS — K8020 Calculus of gallbladder without cholecystitis without obstruction: Secondary | ICD-10-CM

## 2011-05-11 DIAGNOSIS — I2510 Atherosclerotic heart disease of native coronary artery without angina pectoris: Secondary | ICD-10-CM | POA: Insufficient documentation

## 2011-05-11 DIAGNOSIS — J4489 Other specified chronic obstructive pulmonary disease: Secondary | ICD-10-CM | POA: Insufficient documentation

## 2011-05-11 DIAGNOSIS — I5020 Unspecified systolic (congestive) heart failure: Secondary | ICD-10-CM | POA: Insufficient documentation

## 2011-05-11 DIAGNOSIS — G40909 Epilepsy, unspecified, not intractable, without status epilepticus: Secondary | ICD-10-CM | POA: Insufficient documentation

## 2011-05-11 DIAGNOSIS — I519 Heart disease, unspecified: Secondary | ICD-10-CM | POA: Insufficient documentation

## 2011-05-11 DIAGNOSIS — J449 Chronic obstructive pulmonary disease, unspecified: Secondary | ICD-10-CM | POA: Insufficient documentation

## 2011-05-11 DIAGNOSIS — N400 Enlarged prostate without lower urinary tract symptoms: Secondary | ICD-10-CM | POA: Insufficient documentation

## 2011-05-11 DIAGNOSIS — Z9861 Coronary angioplasty status: Secondary | ICD-10-CM | POA: Insufficient documentation

## 2011-05-11 DIAGNOSIS — I10 Essential (primary) hypertension: Secondary | ICD-10-CM | POA: Insufficient documentation

## 2011-05-11 DIAGNOSIS — I252 Old myocardial infarction: Secondary | ICD-10-CM | POA: Insufficient documentation

## 2011-05-11 DIAGNOSIS — R079 Chest pain, unspecified: Secondary | ICD-10-CM

## 2011-05-11 DIAGNOSIS — R0789 Other chest pain: Principal | ICD-10-CM | POA: Insufficient documentation

## 2011-05-11 HISTORY — DX: Unspecified convulsions: R56.9

## 2011-05-11 HISTORY — DX: Encounter for other specified aftercare: Z51.89

## 2011-05-11 HISTORY — DX: Angina pectoris, unspecified: I20.9

## 2011-05-11 HISTORY — DX: Pure hypercholesterolemia, unspecified: E78.00

## 2011-05-11 HISTORY — DX: Reserved for inherently not codable concepts without codable children: IMO0001

## 2011-05-11 LAB — CARDIAC PANEL(CRET KIN+CKTOT+MB+TROPI)
CK, MB: 3.5 ng/mL (ref 0.3–4.0)
Relative Index: INVALID (ref 0.0–2.5)
Relative Index: INVALID (ref 0.0–2.5)
Total CK: 80 U/L (ref 7–232)
Total CK: 79 U/L (ref 7–232)

## 2011-05-11 LAB — DIFFERENTIAL
Basophils Absolute: 0.1 10*3/uL (ref 0.0–0.1)
Eosinophils Relative: 2 % (ref 0–5)
Lymphocytes Relative: 10 % — ABNORMAL LOW (ref 12–46)
Neutro Abs: 3.9 10*3/uL (ref 1.7–7.7)
Neutrophils Relative %: 77 % (ref 43–77)

## 2011-05-11 LAB — COMPREHENSIVE METABOLIC PANEL
ALT: 11 U/L (ref 0–53)
AST: 17 U/L (ref 0–37)
CO2: 24 mEq/L (ref 19–32)
Calcium: 9 mg/dL (ref 8.4–10.5)
GFR calc non Af Amer: 79 mL/min — ABNORMAL LOW (ref >90)
Sodium: 138 mEq/L (ref 135–145)

## 2011-05-11 LAB — CBC
Platelets: 261 10*3/uL (ref 150–400)
RDW: 14.6 % (ref 11.5–15.5)
WBC: 5.1 10*3/uL (ref 4.0–10.5)

## 2011-05-11 MED ORDER — SODIUM CHLORIDE 0.9 % IJ SOLN
3.0000 mL | Freq: Two times a day (BID) | INTRAMUSCULAR | Status: DC
  Administered 2011-05-11: 3 mL via INTRAVENOUS

## 2011-05-11 MED ORDER — ROSUVASTATIN CALCIUM 40 MG PO TABS
40.0000 mg | ORAL_TABLET | Freq: Every day | ORAL | Status: DC
  Administered 2011-05-11 – 2011-05-12 (×2): 40 mg via ORAL
  Filled 2011-05-11 (×2): qty 1

## 2011-05-11 MED ORDER — OXYBUTYNIN CHLORIDE ER 15 MG PO TB24
15.0000 mg | ORAL_TABLET | Freq: Every day | ORAL | Status: DC
  Administered 2011-05-11 – 2011-05-12 (×2): 15 mg via ORAL
  Filled 2011-05-11 (×2): qty 1

## 2011-05-11 MED ORDER — NITROGLYCERIN 2 % TD OINT
1.0000 [in_us] | TOPICAL_OINTMENT | Freq: Once | TRANSDERMAL | Status: AC
  Administered 2011-05-11: 1 [in_us] via TOPICAL

## 2011-05-11 MED ORDER — PHENYTOIN SODIUM EXTENDED 100 MG PO CAPS
100.0000 mg | ORAL_CAPSULE | Freq: Two times a day (BID) | ORAL | Status: DC
  Administered 2011-05-11 – 2011-05-12 (×2): 100 mg via ORAL
  Filled 2011-05-11 (×3): qty 1

## 2011-05-11 MED ORDER — CARVEDILOL 12.5 MG PO TABS
12.5000 mg | ORAL_TABLET | Freq: Two times a day (BID) | ORAL | Status: DC
  Administered 2011-05-11 – 2011-05-12 (×2): 12.5 mg via ORAL
  Filled 2011-05-11 (×4): qty 1

## 2011-05-11 MED ORDER — ASPIRIN 325 MG PO TABS
325.0000 mg | ORAL_TABLET | Freq: Every day | ORAL | Status: DC
  Administered 2011-05-12: 325 mg via ORAL
  Filled 2011-05-11 (×2): qty 1

## 2011-05-11 MED ORDER — PHENAZOPYRIDINE HCL 200 MG PO TABS
200.0000 mg | ORAL_TABLET | Freq: Every day | ORAL | Status: DC | PRN
  Administered 2011-05-12: 200 mg via ORAL
  Filled 2011-05-11: qty 1

## 2011-05-11 MED ORDER — SODIUM CHLORIDE 0.9 % IJ SOLN: 3.0000 mL | INTRAMUSCULAR | Status: DC | PRN

## 2011-05-11 MED ORDER — ACETAMINOPHEN 650 MG RE SUPP: 650.0000 mg | Freq: Four times a day (QID) | RECTAL | Status: DC | PRN

## 2011-05-11 MED ORDER — ACETAMINOPHEN 325 MG PO TABS: 650.0000 mg | ORAL_TABLET | Freq: Four times a day (QID) | ORAL | Status: DC | PRN

## 2011-05-11 MED ORDER — CLOPIDOGREL BISULFATE 75 MG PO TABS
75.0000 mg | ORAL_TABLET | Freq: Every day | ORAL | Status: DC
  Administered 2011-05-11 – 2011-05-12 (×2): 75 mg via ORAL
  Filled 2011-05-11 (×2): qty 1

## 2011-05-11 MED ORDER — FERROUS SULFATE 325 (65 FE) MG PO TABS
325.0000 mg | ORAL_TABLET | Freq: Every day | ORAL | Status: DC
  Administered 2011-05-12: 325 mg via ORAL
  Filled 2011-05-11 (×2): qty 1

## 2011-05-11 MED ORDER — NITROGLYCERIN 0.4 MG/SPRAY TL SOLN
1.0000 | Status: DC | PRN
  Filled 2011-05-11: qty 4.9

## 2011-05-11 MED ORDER — SODIUM CHLORIDE 0.9 % IV SOLN: 250.0000 mL | INTRAVENOUS | Status: DC | PRN

## 2011-05-11 MED ORDER — LOSARTAN POTASSIUM 50 MG PO TABS
100.0000 mg | ORAL_TABLET | Freq: Every day | ORAL | Status: DC
  Administered 2011-05-11 – 2011-05-12 (×2): 100 mg via ORAL
  Filled 2011-05-11 (×2): qty 2

## 2011-05-11 MED ORDER — SPIRONOLACTONE 25 MG PO TABS
25.0000 mg | ORAL_TABLET | Freq: Every day | ORAL | Status: DC
  Administered 2011-05-11 – 2011-05-12 (×2): 25 mg via ORAL
  Filled 2011-05-11 (×2): qty 1

## 2011-05-11 NOTE — ED Notes (Signed)
Pt to ED via [facility], pt c/o sudden onset substernal chest pain starting 0830 this am, pt took 2 nitro's at home, and ASA 324 mg, pt's chest pain originally 9/10 and 0/10 after 2nd nitro, 20 g IV LFA, and 12 lead NSR w/ PVC's

## 2011-05-11 NOTE — H&P (Signed)
CARDIOLOGY ADMISSION NOTE  Patient ID: [name] MRN: [id number] DOB/AGE: [birth year] 76 y.o.  Admit date: 05/11/2011 Primary Physician   Dr. Felipa Eth Primary Cardiologist   Dr. Eden Emms Chief Complaint    Chest pain  HPI: [name] is a pleasant 78 year old man with past history of CAD-status post LAD BMS in December 2010, non-STEMI in November 2011- repeat cath showing no new lesions-but diffuse coronary disease, last EF 25-30% per echo in 2012 comes to the ER with complaint of chest pain. He was seen by [facility] with [place] [facility] on morning of admission for scheduling of cholecystectomy- for his cholelithiasis. He woke up around 5 AM and chest pain which was deep/dull involving right and left side of chest- which resolved by itself. Then he went to the surgery clinic where he started having chest pain, and used sublingual nitroglycerin spray x2 which relieved his pain. He was sent to ER for cardiology clearance for possible cholecystectomy on 05/30/2011. He does complain of 2 loose stools last night after eating [employer] sandwich, but denies any blood in stool.  While in ER, he doesn't complain of any more chest pain or shortness of breath, nausea, vomiting, dizziness, orthopnea. He also denies any PND or leg swelling. He has no dyspnea on exertion and has no chest pain for past 2-3 months before today.  Denies any fever, chills, headache, palpitations, abdominal pain.    Past Medical History  Diagnosis Date  . Hypertension   . Acute MI Dec 2010    cardiac catheriziation and stenting of the LAD (bare metal) 03/14/09              . Seizure disorder     on dilantin  . Vision impairment   . Urethral stricture     with subsequent trauma and has an indwelling Foley catheter in place during his first MI                                                 . Anemia   . NSTEMI (non-ST elevated myocardial infarction) Nov 2011    with LV dysfunction  . LV dysfunction     EF 25 to 30%  per echo May 2012  . CAD (coronary artery disease)   . Prostatic hypertrophy   . COPD (chronic obstructive pulmonary disease)     Past Surgical History  Procedure Date  . Coronary stent placement 03/14/2009    Stent to the LAD  . Cardiac catheterization 02/2010    Allergies  Allergen Reactions  . Iodinated Diagnostic Agents Rash    Pt give 50 mg Benadryl prior to Omnipaque injection, tolerated well.   No current facility-administered medications on file prior to encounter.   Current Outpatient Prescriptions on File Prior to Encounter  Medication Sig Dispense Refill  . aspirin 325 MG tablet Take 325 mg by mouth daily.        Marland Kitchen atorvastatin (LIPITOR) 80 MG tablet Take 80 mg by mouth at bedtime.        . carvedilol (COREG) 12.5 MG tablet Take 1 tablet (12.5 mg total) by mouth 2 (two) times daily.  60 tablet  11  . clopidogrel (PLAVIX) 75 MG tablet Take 75 mg by mouth daily.        . ferrous sulfate 325 (65 FE) MG tablet Take 325 mg by mouth daily  with breakfast.      . losartan (COZAAR) 100 MG tablet Take 1 tablet (100 mg total) by mouth daily.  30 tablet  11  . nitroGLYCERIN (NITROLINGUAL) 0.4 MG/SPRAY spray Place 1 spray under the tongue every 5 (five) minutes as needed. For chest pain       . oxybutynin (DITROPAN XL) 15 MG 24 hr tablet Take 15 mg by mouth daily.        Marland Kitchen spironolactone (ALDACTONE) 25 MG tablet Take 25 mg by mouth daily.         History   Social History  . Marital Status: Married    Spouse Name: N/A    Number of Children: 0  . Years of Education: N/A   Occupational History  . RETIRED    Social History Main Topics  . Smoking status: Former Smoker -- 1.0 packs/day for 50 years    Types: Cigarettes    Quit date: 08/22/2004  . Smokeless tobacco: Never Used  . Alcohol Use: No  . Drug Use: No  . Sexually Active: Not on file   Other Topics Concern  . Not on file   Social History Narrative  . No narrative on file    Family History  Problem Relation Age  of Onset  . Colon cancer Neg Hx       Physical Exam: Blood pressure 160/87, pulse 75, temperature 99 F (37.2 C), temperature source Oral, resp. rate 15, SpO2 100.00%.  Constitutional: Vital signs reviewed.  Patient is a well-developed and well-nourished in no acute distress and cooperative with exam. Alert and oriented x3.  Head: Normocephalic and atraumatic Mouth: no erythema or exudates, MMM Eyes: PERRL, EOMI, conjunctivae normal, No scleral icterus.  Neck: Supple, Trachea midline normal ROM, No JVD, or carotid bruit present.  Cardiovascular: RRR, S1 normal, S2 normal, no MRG, pulses symmetric and intact bilaterally Pulmonary/Chest: CTAB, no wheezes, rales, or rhonchi Abdominal: Soft. Non-tender, non-distended, bowel sounds are normal, no masses, organomegaly, or guarding present.  Neurological: A&O x3, Strenght is normal and symmetric bilaterally Skin: Warm, dry and intact. No rash, cyanosis, or clubbing.  Psychiatric: Normal mood and affect.    Labs: Lab Results  Component Value Date   BUN 12 05/11/2011   Lab Results  Component Value Date   CREATININE 0.96 05/11/2011   Lab Results  Component Value Date   NA 138 05/11/2011   K 3.6 05/11/2011   CL 104 05/11/2011   CO2 24 05/11/2011   Lab Results  Component Value Date   CKTOTAL 130 01/29/2011   CKMB 3.6 01/29/2011   TROPONINI <0.30 01/29/2011   Lab Results  Component Value Date   WBC 5.1 05/11/2011   HGB 11.4* 05/11/2011   HCT 34.9* 05/11/2011   MCV 89.0 05/11/2011   PLT 261 05/11/2011   Lab Results  Component Value Date   CHOL  Value: 139        ATP III CLASSIFICATION:  <200     mg/dL   Desirable  010-272  mg/dL   Borderline High  >=536    mg/dL   High        64/40/3474   HDL 48 02/10/2010   LDLCALC  Value: 83        Total Cholesterol/HDL:CHD Risk Coronary Heart Disease Risk Table                     Men   Women  1/2 Average Risk   3.4   3.3  Average Risk       5.0   4.4  2 X Average Risk   9.6   7.1  3 X Average Risk  23.4   11.0         Use the calculated Patient Ratio above and the CHD Risk Table to determine the patient's CHD Risk.        ATP III CLASSIFICATION (LDL):  <100     mg/dL   Optimal  409-811  mg/dL   Near or Above                    Optimal  130-159  mg/dL   Borderline  914-782  mg/dL   High  >956     mg/dL   Very High 21/30/8657   TRIG 42 02/10/2010   CHOLHDL 2.9 02/10/2010   Lab Results  Component Value Date   ALT 11 05/11/2011   AST 17 05/11/2011   ALKPHOS 72 05/11/2011   BILITOT 0.4 05/11/2011      Radiology:  Portable chest x-ray: Mild cardiomegaly.    EKG:  ST elevation in V3 and V4- consistent with previous EKGs  ASSESSMENT AND PLAN:    1) Chest pain: Patient with STEMI and LAD stent in December 2010,  last cardiac cath in November 2011 was mild to moderate three-vessel disease with anterior and apical akinesis and with EF of 25-30% per echo in may 2012.  Current chest pain likely atypical but cannot rule out ACS.  Patient scheduled for cholecystectomy on 05/30/2011 and needs cardiac clearance.  Plan: Admit for observation to telemetry bed and cycle cardiac enzymes. - Will consider inpatient versus outpatient Myoview for further risk stratification before surgery. - Likely DC tomorrow if ruled out.  2) Severe systolic heart failure: Last EF of 25-30% per 2-D echo in may 2012. - Currently stable with no signs of fluid overload or hypoperfusion. - Continue home meds Coreg, Cozaar, spironolactone.  3) CAD: Anterolateral STEMI in December 2010 with LAD stent. Not STEMI in November 20 11 repeat cath results as per #1. Will cycle cardiac enzymes and keep him on telemetry bed.  4) Cholelithiasis: Cholecystectomy scheduled at end of February. - Will consider Myoview for clearance.   Signed: PATEL,RAVI 05/11/2011, 2:36 PM  As above, patient seen and examined. Briefly 89er year old male with past medical history of coronary artery disease with chest pain. Patient has had a previous anterior infarct  with PCI of his LAD. Last cardiac catheterization in November of 2011 revealed patent LAD stent. There was a 60-70% stenosis at the origin of the second diagonal. There was mild to moderate disease throughout the circumflex. There was diffuse disease in the right coronary artery between 30-40%. There was a 50% distal stenosis. The ejection fraction was 20%. Echocardiogram in May of 2012 showed an ejection fraction of 25-30%. There was mild mitral regurgitation. There was mild left atrial enlargement. Patient was being seen for preoperative evaluation prior to cholecystectomy today. He described chest pain and was sent to the emergency room. He states he ate something from [place] this morning. He developed diarrhea. He then developed substernal chest aching. There was no radiation. No associated symptoms. Unlike previous infarct pain. Pain was not pleuritic or positional. He did take nitroglycerin with relief after approximately 10 minutes. Presently pain free. Initial enzymes negative. Electrocardiogram shows prior anterior infarct with residual ST elevation but unchanged. Plan admit and rule out myocardial infarction with serial enzymes. If negative outpatient Myoview particularly  as he will require cholecystectomy later this month. Continue present cardiac medications. Olga Millers

## 2011-05-11 NOTE — ED Notes (Signed)
Receiving rn off the floor at lunch, rn covering for receiving rn just received 2 new admits and is unable to take a phone or bedside report

## 2011-05-11 NOTE — ED Provider Notes (Signed)
History     CSN: [id number]  Arrival date & time 05/11/11  1003   First MD Initiated Contact with Patient 05/11/11 1014      Chief Complaint  Patient presents with  . Chest Pain    (Consider location/radiation/quality/duration/timing/severity/associated sxs/prior treatment) Patient is a 78 y.o. male presenting with chest pain. The history is provided by the patient (Patient states that he started today with some chest pain off and on no other symptoms. Patient went over to the surgeon's office who sent him to the hospital. Patient was given a nitroglycerin the way over here on an aspirin).  Chest Pain The chest pain began 12 - 24 hours ago. Chest pain occurs intermittently. The chest pain is resolved. The pain is associated with exertion. At its most intense, the pain is at 3/10. The pain is currently at 2/10. The severity of the pain is moderate. The quality of the pain is described as aching. The pain does not radiate. Pertinent negatives for primary symptoms include no fever, no fatigue, no cough and no abdominal pain.  Pertinent negatives for past medical history include no seizures.     Past Medical History  Diagnosis Date  . Hypertension   . Acute MI Dec 2010    cardiac catheriziation and stenting of the LAD (bare metal) 03/14/09              . Seizure disorder     on dilantin  . Vision impairment   . Urethral stricture     with subsequent trauma and has an indwelling Foley catheter in place during his first MI                                                 . Anemia   . NSTEMI (non-ST elevated myocardial infarction) Nov 2011    with LV dysfunction  . LV dysfunction     EF 25 to 30% per echo May 2012  . CAD (coronary artery disease)   . Prostatic hypertrophy   . COPD (chronic obstructive pulmonary disease)     Past Surgical History  Procedure Date  . Coronary stent placement 03/14/2009    Stent to the LAD  . Cardiac catheterization 02/2010    Family History    Problem Relation Age of Onset  . Colon cancer Neg Hx     History  Substance Use Topics  . Smoking status: Former Smoker -- 1.0 packs/day for 50 years    Types: Cigarettes    Quit date: 08/22/2004  . Smokeless tobacco: Never Used  . Alcohol Use: No      Review of Systems  Constitutional: Negative for fever and fatigue.  HENT: Negative for congestion, sinus pressure and ear discharge.   Eyes: Negative for discharge.  Respiratory: Negative for cough.   Cardiovascular: Positive for chest pain.  Gastrointestinal: Negative for abdominal pain and diarrhea.  Genitourinary: Negative for frequency and hematuria.  Musculoskeletal: Negative for back pain.  Skin: Negative for rash.  Neurological: Negative for seizures and headaches.  Hematological: Negative.   Psychiatric/Behavioral: Negative for hallucinations.    Allergies  Iodinated diagnostic agents  Home Medications   Current Outpatient Rx  Name Route Sig Dispense Refill  . ASPIRIN 325 MG PO TABS Oral Take 325 mg by mouth daily.      . ATORVASTATIN CALCIUM 80 MG PO  TABS Oral Take 80 mg by mouth at bedtime.      Marland Kitchen CARVEDILOL 12.5 MG PO TABS Oral Take 1 tablet (12.5 mg total) by mouth 2 (two) times daily. 60 tablet 11  . CLOPIDOGREL BISULFATE 75 MG PO TABS Oral Take 75 mg by mouth daily.      Marland Kitchen FERROUS SULFATE 325 (65 FE) MG PO TABS Oral Take 325 mg by mouth daily with breakfast.    . LOSARTAN POTASSIUM 100 MG PO TABS Oral Take 1 tablet (100 mg total) by mouth daily. 30 tablet 11  . NITROGLYCERIN 0.4 MG/SPRAY TL SOLN Sublingual Place 1 spray under the tongue every 5 (five) minutes as needed. For chest pain     . OXYBUTYNIN CHLORIDE ER 15 MG PO TB24 Oral Take 15 mg by mouth daily.      Marland Kitchen PHENYTOIN SODIUM EXTENDED 100 MG PO CAPS Oral Take 100 mg by mouth 3 (three) times daily.     Marland Kitchen SPIRONOLACTONE 25 MG PO TABS Oral Take 25 mg by mouth daily.        BP 157/77  Pulse 77  Temp(Src) 99 F (37.2 C) (Oral)  Resp 15  SpO2  95%  Physical Exam  Constitutional: He is oriented to person, place, and time. He appears well-developed.  HENT:  Head: Normocephalic and atraumatic.  Eyes: Conjunctivae and EOM are normal. No scleral icterus.  Neck: Neck supple. No thyromegaly present.  Cardiovascular: Normal rate and regular rhythm.  Exam reveals no gallop and no friction rub.   No murmur heard. Pulmonary/Chest: No stridor. He has no wheezes. He has no rales. He exhibits no tenderness.  Abdominal: He exhibits no distension. There is no tenderness. There is no rebound.  Musculoskeletal: Normal range of motion. He exhibits no edema.  Lymphadenopathy:    He has no cervical adenopathy.  Neurological: He is oriented to person, place, and time. Coordination normal.  Skin: No rash noted. No erythema.  Psychiatric: He has a normal mood and affect. His behavior is normal.    ED Course  Procedures (including critical care time)  Labs Reviewed  CBC - Abnormal; Notable for the following:    RBC 3.92 (*)    Hemoglobin 11.4 (*)    HCT 34.9 (*)    All other components within normal limits  DIFFERENTIAL - Abnormal; Notable for the following:    Lymphocytes Relative 10 (*)    Lymphs Abs 0.5 (*)    All other components within normal limits  COMPREHENSIVE METABOLIC PANEL - Abnormal; Notable for the following:    GFR calc non Af Amer 79 (*)    All other components within normal limits  POCT I-STAT TROPONIN I   Dg Chest Port 1 View  05/11/2011  *RADIOLOGY REPORT*  Clinical Data: Chest pain.  PORTABLE CHEST - 1 VIEW  Comparison: Chest x-ray 04/09/2011.  Findings: Lung volumes are normal.  No consolidative airspace disease.  No pleural effusions.  Pulmonary vasculature is normal. Mild cardiomegaly.  Mild tortuosity of the thoracic aorta (unchanged).  Atherosclerosis in the aorta. Multiple healed right- sided rib fractures again noted.  IMPRESSION: 1.  Mild cardiomegaly. 2.  Atherosclerosis.  Original Report Authenticated By: Florencia Reasons, M.D.     1. Chest pain     The patient had ST elevation 1-2 mm in the 3 and V4. This was also seen in a previous EKG one month prior. I called cardiology immediately and they came over to see the patient. Patient was admitted  to cardiology telemetry. The patient has been pain-free since coming to the emergency department  MDM          Benny Lennert, MD 05/11/11 1415

## 2011-05-11 NOTE — ED Notes (Signed)
Family at bedside. 

## 2011-05-11 NOTE — Progress Notes (Signed)
Patient ID: [name], male   DOB: [birth year], 78 y.o.   MRN: [id number]  Chief Complaint  Patient presents with  . Abdominal Pain    Evaluate gallbladder    HPI [name] is a 78 y.o. male.  This patient is known to me for prior evaluation of symptomatic cholelithiasis. See his prior H&P from August 2012. At that time we felt that he would benefit from cholecystectomy however, given his cardiac history he requested cardiac clearance which was obtained by Dr. Eden Emms.  Since he was seen in August he has had multiple episodes of upper abdominal discomfort and this is occurring multiple times per week. He's been in the emergency room approximately 3 times just in the last month for this problem. He has episodes of right upper quadrant pain and most recently said he had some right upper quadrant discomfort this morning. He does have cholelithiasis on 2 prior ultrasound although his most recent LFTs were mostly normal he did have an elevation of one of his transaminases. HPI  Past Medical History  Diagnosis Date  . Hypertension   . Acute MI Dec 2010    cardiac catheriziation and stenting of the LAD (bare metal) 03/14/09              . Seizure disorder     on dilantin  . Vision impairment   . Urethral stricture     with subsequent trauma and has an indwelling Foley catheter in place during his first MI                                                 . Anemia   . NSTEMI (non-ST elevated myocardial infarction) Nov 2011    with LV dysfunction  . LV dysfunction     EF 25 to 30% per echo May 2012  . CAD (coronary artery disease)   . Prostatic hypertrophy   . COPD (chronic obstructive pulmonary disease)     Past Surgical History  Procedure Date  . Coronary stent placement 03/14/2009    Stent to the LAD  . Cardiac catheterization 02/2010    Family History  Problem Relation Age of Onset  . Colon cancer Neg Hx     Social History History  Substance Use Topics  . Smoking status:  Former Smoker -- 1.0 packs/day for 50 years    Types: Cigarettes    Quit date: 08/22/2004  . Smokeless tobacco: Never Used  . Alcohol Use: No    Allergies  Allergen Reactions  . Iodinated Diagnostic Agents Rash    Pt give 50 mg Benadryl prior to Omnipaque injection, tolerated well.    Current Outpatient Prescriptions  Medication Sig Dispense Refill  . aspirin 325 MG tablet Take 325 mg by mouth daily.        Marland Kitchen atorvastatin (LIPITOR) 80 MG tablet Take 80 mg by mouth at bedtime.        . carvedilol (COREG) 12.5 MG tablet Take 1 tablet (12.5 mg total) by mouth 2 (two) times daily.  60 tablet  11  . clopidogrel (PLAVIX) 75 MG tablet Take 75 mg by mouth daily.        . ferrous sulfate 325 (65 FE) MG tablet Take 325 mg by mouth daily with breakfast.      . losartan (COZAAR) 100 MG tablet Take 1 tablet (100  mg total) by mouth daily.  30 tablet  11  . nitroGLYCERIN (NITROLINGUAL) 0.4 MG/SPRAY spray Place 1 spray under the tongue every 5 (five) minutes as needed. For chest pain       . oxybutynin (DITROPAN XL) 15 MG 24 hr tablet Take 15 mg by mouth daily.        . phenytoin (DILANTIN) 100 MG ER capsule Take 100 mg by mouth 3 (three) times daily.       Marland Kitchen spironolactone (ALDACTONE) 25 MG tablet Take 25 mg by mouth daily.          Review of Systems Review of Systems All other review of systems negative or noncontributory except as stated in the HPI  Blood pressure 140/84, pulse 80, temperature 98.6 F (37 C), temperature source Temporal, resp. rate 18, weight 141 lb (63.957 kg).  Physical Exam Physical Exam Physical Exam  Vitals reviewed. Constitutional: He is oriented to person, place, and time. He appears well-developed and well-nourished. No distress.  HENT:  Head: Normocephalic and atraumatic.  Mouth/Throat: No oropharyngeal exudate.  Eyes: Conjunctivae and EOM are normal. Pupils are equal, round, and reactive to light. Right eye exhibits no discharge. Left eye exhibits no discharge.  No scleral icterus.  Neck: Normal range of motion. No tracheal deviation present.  Cardiovascular: Normal rate, regular rhythm and normal heart sounds.   Pulmonary/Chest: Effort normal and breath sounds normal. No stridor. No respiratory distress. He has no wheezes. He has no rales. He exhibits no tenderness.  Abdominal: Soft. Bowel sounds are normal. He exhibits no distension and no mass. There is no tenderness. There is no rebound and no guarding.  Musculoskeletal: Normal range of motion. He exhibits no edema and no tenderness.  Neurological: He is alert and oriented to person, place, and time.  Skin: Skin is warm and dry. No rash noted. He is not diaphoretic. No erythema. No pallor.  Psychiatric: He has a normal mood and affect. His behavior is normal. Judgment and thought content normal.    Data Reviewed  Assessment    Symptomatic cholelithiasis He has received his cardiac clearance although we realize that he will be high risk for postoperative arrhythmias. Given the frequency of his symptoms as well as the frequency of his emergency room visits, I think that it is reasonable to offer cholecystectomy. His power of attorney was with him during the visit and both of them expressed understanding of the risks of the procedure including the perioperative risks of cardiac palpitations. We also discussed the other risks of the procedure including persistent symptoms in case this is due to other problems such as peptic ulcers disease or cardiac problems. The risks of infection, bleeding, pain, persistent symptoms, scarring, injury to bowel or bile ducts, retained stone, diarrhea, need for additional procedures, and need for open surgery discussed with the patient. They would like to proceed with lap chole for possible relief of his symptoms.     Plan    We will plan for lap chole with IOC as soon as available.  We will plan for overnight admission/observation.      Lodema Pilot  DAVID 05/11/2011, 8:51 AM

## 2011-05-11 NOTE — ED Notes (Signed)
Patient denies pain and is resting comfortably.  

## 2011-05-11 NOTE — ED Notes (Signed)
Pt c/o sudden onset of Left side chest pain starting at 0830 this am, and diarrhea x2 days, pt denies sob, dizziness, N/V, abd/back/shoulder/meck pain. Pt reports making himself vomit x1 last night, pt states "it made my chest feel better." Pt denies any chest pain at this time

## 2011-05-11 NOTE — ED Notes (Signed)
Receiving RN unable to take report on pt at  This time

## 2011-05-12 ENCOUNTER — Encounter (HOSPITAL_COMMUNITY): Payer: PRIVATE HEALTH INSURANCE

## 2011-05-12 LAB — CARDIAC PANEL(CRET KIN+CKTOT+MB+TROPI): Relative Index: INVALID (ref 0.0–2.5)

## 2011-05-12 MED ORDER — CARVEDILOL 12.5 MG PO TABS: 12.5000 mg | ORAL_TABLET | Freq: Two times a day (BID) | ORAL | Status: DC

## 2011-05-12 MED ORDER — NITROGLYCERIN 0.4 MG SL SUBL: 0.4000 mg | SUBLINGUAL_TABLET | SUBLINGUAL | Status: DC | PRN

## 2011-05-12 NOTE — Progress Notes (Signed)
UR Completed. [name], [name] [phone]  

## 2011-05-12 NOTE — Progress Notes (Signed)
Patient ID: [name], male   DOB: Aug 15, 1932, 78 y.o.   MRN: [id number] @ Subjective:  Denies SSCP, palpitations or Dyspnea   Objective:  Filed Vitals:   05/11/11 1418 05/11/11 1540 05/11/11 2100 05/12/11 0500  BP: 160/87 163/89 137/75 128/78  Pulse: 75 75 67 58  Temp:  98.1 F (36.7 C) 98.1 F (36.7 C) 98.4 F (36.9 C)  TempSrc:  Oral Oral Oral  Resp:  18 20 16   Height:  5\' 5"  (1.651 m)    Weight:  61.372 kg (135 lb 4.8 oz)  61.9 kg (136 lb 7.4 oz)  SpO2: 100% 95% 95% 95%    Intake/Output from previous day: No intake or output data in the 24 hours ending 05/12/11 0802  Physical Exam: Chronically ill thin black male Lungs clear S1S2 PMI enlarged BS positive no rebound LE no edema Plus two PT Neuro nonfocal  Lab Results: Basic Metabolic Panel:  Basename 05/11/11 1045  NA 138  K 3.6  CL 104  CO2 24  GLUCOSE 83  BUN 12  CREATININE 0.96  CALCIUM 9.0  MG --  PHOS --   Liver Function Tests:  Basename 05/11/11 1045  AST 17  ALT 11  ALKPHOS 72  BILITOT 0.4  PROT 7.1  ALBUMIN 3.7   No results found for this basename: LIPASE:2,AMYLASE:2 in the last 72 hours CBC:  Basename 05/11/11 1045  WBC 5.1  NEUTROABS 3.9  HGB 11.4*  HCT 34.9*  MCV 89.0  PLT 261   Cardiac Enzymes:  Basename 05/12/11 0429 05/11/11 2150 05/11/11 1619  CKTOTAL 60 79 80  CKMB 3.0 3.5 3.5  CKMBINDEX -- -- --  TROPONINI <0.30 <0.30 <0.30    Imaging: Dg Chest Port 1 View  05/11/2011  *RADIOLOGY REPORT*  Clinical Data: Chest pain.  PORTABLE CHEST - 1 VIEW  Comparison: Chest x-ray 04/09/2011.  Findings: Lung volumes are normal.  No consolidative airspace disease.  No pleural effusions.  Pulmonary vasculature is normal. Mild cardiomegaly.  Mild tortuosity of the thoracic aorta (unchanged).  Atherosclerosis in the aorta. Multiple healed right- sided rib fractures again noted.  IMPRESSION: 1.  Mild cardiomegaly. 2.  Atherosclerosis.  Original Report Authenticated By: Florencia Reasons,  M.D.    Cardiac Studies:  ECG:  2/7 NSR anterolateral T wave changes not new   Telemetry:  NSR no arrhythia   Medications:     . aspirin  325 mg Oral Daily  . carvedilol  12.5 mg Oral BID WC  . clopidogrel  75 mg Oral Daily  . ferrous sulfate  325 mg Oral Q breakfast  . losartan  100 mg Oral Daily  . nitroGLYCERIN  1 inch Topical Once  . oxybutynin  15 mg Oral Daily  . phenytoin  100 mg Oral BID  . rosuvastatin  40 mg Oral Daily  . sodium chloride  3 mL Intravenous Q12H  . spironolactone  25 mg Oral Daily       Assessment/Plan:  Chest Pain: Atypical R/O  Outpatient myovue early next week.  Continue ASA and betablocker GB:  Would like to keep surgical date set for 2/27 if myovue ok next week. I think he got some Bad salami at [place] that led to his current illness Placement:  Wife trying to get him to assisted living.  F/U Dr Felipa Eth.  Not appropriate to do from this 24 hr obs stay  D/C myovue next week then surgery 2/27 with Calla Kicks [facility] 05/12/2011, 8:02 AM

## 2011-05-12 NOTE — Discharge Summary (Signed)
Patient ID: [name],  MRN: [id number], DOB/AGE: Feb 13, 1933 78 y.o.  Admit date: 05/11/2011 Discharge date: 05/12/2011  Primary Care Provider: R. Avva Primary Cardiologist: P. Nishan  Discharge Diagnoses Principal Problem:  *Chest pain, unspecified Active Problems:  Hypertension  LV dysfunction  CAD (coronary artery disease)   Allergies Allergies  Allergen Reactions  . Iodinated Diagnostic Agents Rash    Pt give 50 mg Benadryl prior to Omnipaque injection, tolerated well.    Procedures  None  History of Present Illness  A 78 year old male with prior history of coronary artery disease status post bare-metal stenting of the LAD in December of 2000 and who was in his usual state of health until approximately 5 AM on February 7, when patient experienced a dull bilateral chest discomfort that resolved spontaneously. Patient had an appointment with Gen. surgery for evaluation regarding pending cholecystectomy. While in the surgical office, patient had recurrent chest discomfort relieved with 2 sublingual nitroglycerin glycerin sprays. Patient was sent to the ER for further evaluation. In the ER his ECG was without acute changes and his cardiac markers were negative. Patient reported having eaten a [employer] sandwich the night prior which was followed by 2 loose stools. Decision was made to admit him for further evaluation.  Hospital Course  Following admission, patient had no further chest discomfort. His cardiac enzymes have remained negative. As is pending gallbladder surgery, we have arranged for an outpatient Lexiscan Myoview next week to further evaluate preoperative cardiac risk. He'll be discharged home today in good condition.  Discharge Vitals Blood pressure 128/78, pulse 58, temperature 98.4 F (36.9 C), temperature source Oral, resp. rate 16, height 5\' 5"  (1.651 m), weight 136 lb 7.4 oz (61.9 kg), SpO2 95.00%.  Filed Weights   05/11/11 1540 05/12/11 0500  Weight: 135 lb 4.8  oz (61.372 kg) 136 lb 7.4 oz (61.9 kg)    Labs  CBC  Basename 05/11/11 1045  WBC 5.1  NEUTROABS 3.9  HGB 11.4*  HCT 34.9*  MCV 89.0  PLT 261   Basic Metabolic Panel  Basename 05/11/11 1045  NA 138  K 3.6  CL 104  CO2 24  GLUCOSE 83  BUN 12  CREATININE 0.96  CALCIUM 9.0  MG --  PHOS --   Liver Function Tests  Basename 05/11/11 1045  AST 17  ALT 11  ALKPHOS 72  BILITOT 0.4  PROT 7.1  ALBUMIN 3.7   Cardiac Enzymes  Basename 05/12/11 0429 05/11/11 2150 05/11/11 1619  CKTOTAL 60 79 80  CKMB 3.0 3.5 3.5  CKMBINDEX -- -- --  TROPONINI <0.30 <0.30 <0.30    Disposition  Pt is being discharged home today in good condition.  Follow-up Plans & Appointments  Follow-up Information    Follow up with AVVA,RAVISANKAR R, MD. (As needed)       Follow up with [facility] on 05/17/2011. (1pm - Pharmacologic Stress Test - Nothing to eat after 6am and no caffeine on  day before.)    Contact information:   [address] [place] [phone]      Follow up with Charlton Haws, MD on 05/29/2011. (4:15)    Contact information:   [address] [place] [address] [phone]          Discharge Medications  Medication List  As of 05/12/2011  9:41 AM   TAKE these medications         aspirin 325 MG tablet   Take  325 mg by mouth daily.      atorvastatin 80 MG tablet   Commonly known as: LIPITOR   Take 80 mg by mouth at bedtime.      carvedilol 12.5 MG tablet   Commonly known as: COREG   Take 1 tablet (12.5 mg total) by mouth 2 (two) times daily with a meal.      clopidogrel 75 MG tablet   Commonly known as: PLAVIX   Take 75 mg by mouth daily.      ferrous sulfate 325 (65 FE) MG tablet   Take 325 mg by mouth daily with breakfast.      losartan 100 MG tablet   Commonly known as: COZAAR   Take 1 tablet (100 mg total) by mouth daily.      nitroGLYCERIN 0.4 MG/SPRAY spray   Commonly known as:  NITROLINGUAL   Place 1 spray under the tongue every 5 (five) minutes as needed. For chest pain      oxybutynin 15 MG 24 hr tablet   Commonly known as: DITROPAN XL   Take 15 mg by mouth daily.      phenytoin 100 MG ER capsule   Commonly known as: DILANTIN   Take 100 mg by mouth 3 (three) times daily.      spironolactone 25 MG tablet   Commonly known as: ALDACTONE   Take 25 mg by mouth daily.            Outstanding Labs/Studies  None  Duration of Discharge Encounter   Greater than 30 minutes including physician time.  Signed, Nicolasa Ducking NP 05/12/2011, 9:41 AM

## 2011-05-15 ENCOUNTER — Other Ambulatory Visit (HOSPITAL_COMMUNITY): Payer: Self-pay | Admitting: Cardiovascular Disease

## 2011-05-15 ENCOUNTER — Encounter (HOSPITAL_COMMUNITY): Payer: PRIVATE HEALTH INSURANCE

## 2011-05-15 DIAGNOSIS — R079 Chest pain, unspecified: Secondary | ICD-10-CM

## 2011-05-16 ENCOUNTER — Encounter (HOSPITAL_COMMUNITY): Payer: Self-pay | Admitting: Pharmacy Technician

## 2011-05-16 ENCOUNTER — Encounter (HOSPITAL_COMMUNITY)
Admission: RE | Admit: 2011-05-16 | Discharge: 2011-05-16 | Disposition: A | Discharge status: Home / Self Care | Payer: Medicare Other | Source: Ambulatory Visit | Attending: General Surgery | Admitting: General Surgery

## 2011-05-16 ENCOUNTER — Encounter (HOSPITAL_COMMUNITY): Payer: Self-pay

## 2011-05-16 DIAGNOSIS — K8020 Calculus of gallbladder without cholecystitis without obstruction: Secondary | ICD-10-CM

## 2011-05-16 HISTORY — DX: Presence of urogenital implants: Z96.0

## 2011-05-16 LAB — COMPREHENSIVE METABOLIC PANEL
ALT: 16 U/L (ref 0–53)
Alkaline Phosphatase: 80 U/L (ref 39–117)
BUN: 16 mg/dL (ref 6–23)
CO2: 25 mEq/L (ref 19–32)
Calcium: 9 mg/dL (ref 8.4–10.5)
GFR calc Af Amer: 90 mL/min — ABNORMAL LOW (ref >90)
GFR calc non Af Amer: 78 mL/min — ABNORMAL LOW (ref >90)
Glucose, Bld: 94 mg/dL (ref 70–99)
Sodium: 142 mEq/L (ref 135–145)

## 2011-05-16 LAB — CBC
HCT: 32.6 % — ABNORMAL LOW (ref 39.0–52.0)
Hemoglobin: 10.8 g/dL — ABNORMAL LOW (ref 13.0–17.0)
MCH: 29.6 pg (ref 26.0–34.0)
RBC: 3.65 MIL/uL — ABNORMAL LOW (ref 4.22–5.81)

## 2011-05-16 LAB — SURGICAL PCR SCREEN: MRSA, PCR: NEGATIVE

## 2011-05-16 LAB — DIFFERENTIAL
Basophils Absolute: 0.1 10*3/uL (ref 0.0–0.1)
Eosinophils Absolute: 0.3 10*3/uL (ref 0.0–0.7)
Eosinophils Relative: 5 % (ref 0–5)
Lymphocytes Relative: 17 % (ref 12–46)

## 2011-05-16 NOTE — Pre-Procedure Instructions (Signed)
20 [name]  05/16/2011   Your procedure is scheduled on:  05/30/11  Report to [facility] at 530 AM.  Call this number if you have problems the morning of surgery: [phone]   Remember:   Do not eat food:After Midnight.  May have clear liquids: up to 4 Hours before arrival.  Clear liquids include soda, tea, black coffee, apple or grape juice, broth.  Take these medicines the morning of surgery with A SIP OF WATER: coreg,dilantin,aldactone   Do not wear jewelry, make-up or nail polish.  Do not wear lotions, powders, or perfumes. You may wear deodorant.  Do not shave 48 hours prior to surgery.  Do not bring valuables to the hospital.  Contacts, dentures or bridgework may not be worn into surgery.  Leave suitcase in the car. After surgery it may be brought to your room.  For patients admitted to the hospital, checkout time is 11:00 AM the day of discharge.   Patients discharged the day of surgery will not be allowed to drive home.  Name and phone number of your driver: family  Special Instructions: CHG Shower Use Special Wash: 1/2 bottle night before surgery and 1/2 bottle morning of surgery.   Please read over the following fact sheets that you were given: Pain Booklet, MRSA Information and Surgical Site Infection Prevention

## 2011-05-16 NOTE — Progress Notes (Signed)
Echo,cath in epic

## 2011-05-17 ENCOUNTER — Ambulatory Visit (HOSPITAL_COMMUNITY): Payer: Medicare Other | Attending: Cardiovascular Disease | Admitting: Radiology

## 2011-05-17 ENCOUNTER — Encounter (HOSPITAL_COMMUNITY): Payer: PRIVATE HEALTH INSURANCE

## 2011-05-17 VITALS — BP 154/98 | Ht 65.0 in | Wt 134.0 lb

## 2011-05-17 DIAGNOSIS — Z87891 Personal history of nicotine dependence: Secondary | ICD-10-CM | POA: Insufficient documentation

## 2011-05-17 DIAGNOSIS — J449 Chronic obstructive pulmonary disease, unspecified: Secondary | ICD-10-CM | POA: Insufficient documentation

## 2011-05-17 DIAGNOSIS — I10 Essential (primary) hypertension: Secondary | ICD-10-CM | POA: Insufficient documentation

## 2011-05-17 DIAGNOSIS — E785 Hyperlipidemia, unspecified: Secondary | ICD-10-CM | POA: Insufficient documentation

## 2011-05-17 DIAGNOSIS — I2510 Atherosclerotic heart disease of native coronary artery without angina pectoris: Secondary | ICD-10-CM

## 2011-05-17 DIAGNOSIS — I252 Old myocardial infarction: Secondary | ICD-10-CM | POA: Insufficient documentation

## 2011-05-17 DIAGNOSIS — J4489 Other specified chronic obstructive pulmonary disease: Secondary | ICD-10-CM | POA: Insufficient documentation

## 2011-05-17 DIAGNOSIS — R079 Chest pain, unspecified: Secondary | ICD-10-CM

## 2011-05-17 DIAGNOSIS — Z01810 Encounter for preprocedural cardiovascular examination: Secondary | ICD-10-CM | POA: Insufficient documentation

## 2011-05-17 MED ORDER — TECHNETIUM TC 99M TETROFOSMIN IV KIT
11.0000 | PACK | Freq: Once | INTRAVENOUS | Status: AC | PRN
  Administered 2011-05-17: 11 via INTRAVENOUS

## 2011-05-17 MED ORDER — REGADENOSON 0.4 MG/5ML IV SOLN
0.4000 mg | Freq: Once | INTRAVENOUS | Status: AC
  Administered 2011-05-17: 0.4 mg via INTRAVENOUS

## 2011-05-17 MED ORDER — TECHNETIUM TC 99M TETROFOSMIN IV KIT
33.0000 | PACK | Freq: Once | INTRAVENOUS | Status: AC | PRN
  Administered 2011-05-17: 33 via INTRAVENOUS

## 2011-05-17 NOTE — Progress Notes (Signed)
[facility] SITE 3 NUCLEAR MED [address] [place] [address] [phone]  Cardiology Nuclear Med Study  [name] is a 78 y.o. male [id number] Dec 12, 1932   Nuclear Med Background Indication for Stress Test:  Evaluation for Ischemia, Stent Patency, Post Hospital on 05/11/11 for Chest Pain with (-) Enzymes and Surgical Clearance for Pending Cholecystectomy on 05/30/11 by Dr. Lodema Pilot History:  COPD and '10 MI>Stent-LAD; '11 NSTEMI>Cath:Patent stent with mild-moderate diffuse CAD, EF=20%; 5/12 Echo:EF=25-30% Cardiac Risk Factors: History of Smoking, Hypertension and Lipids  Symptoms:  Chest Pain/"Ache" (last episode of chest discomfort:none since discharge)   Nuclear Pre-Procedure Caffeine/Decaff Intake:  None NPO After: 8:00am   Lungs:  Clear.  O2 SAT 96% on RA IV 0.9% NS with Angio Cath:  22g  IV Site: R Forearm  IV Started by:  Stanton Kidney, EMT-P  Chest Size (in):  38 Cup Size: n/a  Height: 5\' 5"  (1.651 m)  Weight:  134 lb (60.782 kg)  BMI:  Body mass index is 22.30 kg/(m^2). Tech Comments:  Coreg held this am    Nuclear Med Study 1 or 2 day study: 1 day  Stress Test Type:  Lexiscan  Reading MD: Charlton Haws, MD  Order Authorizing Provider:  Charlton Haws, MD  Resting Radionuclide: Technetium 60m Tetrofosmin  Resting Radionuclide Dose: 11 mCi   Stress Radionuclide:  Technetium 44m Tetrofosmin  Stress Radionuclide Dose: 33 mCi           Stress Protocol Rest HR: 72 Stress HR: 89  Rest BP: 154/98 Stress BP: 161/98  Exercise Time (min): n/a METS: n/a   Predicted Max HR: 144 bpm % Max HR: 61.81 bpm Rate Pressure Product: 46962   Dose of Adenosine (mg):  n/a Dose of Lexiscan: 0.4 mg  Dose of Atropine (mg): n/a Dose of Dobutamine: n/a mcg/kg/min (at max HR)  Stress Test Technologist: Smiley Houseman, CMA-N  Nuclear Technologist:  Domenic Polite, CNMT     Rest Procedure:  Myocardial perfusion imaging was performed at rest 45 minutes following  the intravenous administration of Technetium 35m Tetrofosmin.  Rest ECG: LVH with occasional PVC.  Stress Procedure:  The patient received IV Lexiscan 0.4 mg over 15-seconds.  Technetium 39m Tetrofosmin injected at 30-seconds.  There were no significant changes with Lexiscan, occasional PVC's.  He did c/o chest pressure with Lexiscan.  Quantitative spect images were obtained after a 45 minute delay.  Stress ECG: No significant change from baseline ECG  QPS Raw Data Images:  Normal; no motion artifact; normal heart/lung ratio. Stress Images:  There is decreased uptake in the anterior wall. Rest Images:  There is decreased uptake in the anterior wall. Subtraction (SDS):  There is a fixed defect that is most consistent with a previous infarction. Transient Ischemic Dilatation (Normal <1.22):  .91 Lung/Heart Ratio (Normal <0.45):  .28  Quantitative Gated Spect Images QGS EDV:  258 ml QGS ESV:  197 ml QGS cine images:  Anterior apical akinesis QGS EF: 24%   Impression Exercise Capacity:  Lexiscan with no exercise. BP Response:  Normal blood pressure response. Clinical Symptoms:  No chest pain. ECG Impression:  No significant ST segment change suggestive of ischemia. Comparison with Prior Nuclear Study: No previous nuclear study performed  Overall Impression:  large anterior wall infarct with no ishemia.  EF 24%    Charlton Haws

## 2011-05-19 ENCOUNTER — Encounter (HOSPITAL_COMMUNITY): Admission: RE | Admit: 2011-05-19 | Payer: PRIVATE HEALTH INSURANCE | Source: Ambulatory Visit

## 2011-05-22 ENCOUNTER — Encounter (HOSPITAL_COMMUNITY): Payer: PRIVATE HEALTH INSURANCE

## 2011-05-24 ENCOUNTER — Encounter (HOSPITAL_COMMUNITY): Payer: PRIVATE HEALTH INSURANCE

## 2011-05-25 ENCOUNTER — Other Ambulatory Visit: Payer: Self-pay | Admitting: Cardiovascular Disease

## 2011-05-25 NOTE — Telephone Encounter (Signed)
completed

## 2011-05-26 ENCOUNTER — Encounter (HOSPITAL_COMMUNITY): Payer: PRIVATE HEALTH INSURANCE

## 2011-05-29 ENCOUNTER — Encounter (HOSPITAL_COMMUNITY): Payer: PRIVATE HEALTH INSURANCE

## 2011-05-29 ENCOUNTER — Encounter: Payer: Medicare Other | Admitting: Cardiovascular Disease

## 2011-05-29 MED ORDER — CEFAZOLIN SODIUM 1-5 GM-% IV SOLN
1.0000 g | INTRAVENOUS | Status: AC
Start: 2011-05-29 — End: 2011-05-30
  Administered 2011-05-30: 1 g via INTRAVENOUS

## 2011-05-30 ENCOUNTER — Ambulatory Visit (HOSPITAL_COMMUNITY): Payer: Medicare Other | Admitting: Anesthesiology

## 2011-05-30 ENCOUNTER — Encounter (HOSPITAL_COMMUNITY): Payer: Self-pay | Admitting: *Deleted

## 2011-05-30 ENCOUNTER — Encounter (HOSPITAL_COMMUNITY): Payer: Self-pay | Admitting: General Practice

## 2011-05-30 ENCOUNTER — Encounter (HOSPITAL_COMMUNITY): Payer: Self-pay | Admitting: Anesthesiology

## 2011-05-30 ENCOUNTER — Encounter (HOSPITAL_COMMUNITY)
Admission: RE | Disposition: A | Discharge status: Skilled Nursing Facility | Payer: Self-pay | Source: Ambulatory Visit | Attending: General Surgery

## 2011-05-30 ENCOUNTER — Ambulatory Visit (HOSPITAL_COMMUNITY)
Admission: RE | Admit: 2011-05-30 | Discharge: 2011-05-31 | Disposition: A | Discharge status: Skilled Nursing Facility | Payer: Medicare Other | Source: Ambulatory Visit | Attending: General Surgery | Admitting: General Surgery

## 2011-05-30 DIAGNOSIS — J449 Chronic obstructive pulmonary disease, unspecified: Secondary | ICD-10-CM | POA: Insufficient documentation

## 2011-05-30 DIAGNOSIS — K8010 Calculus of gallbladder with chronic cholecystitis without obstruction: Secondary | ICD-10-CM | POA: Insufficient documentation

## 2011-05-30 DIAGNOSIS — I2510 Atherosclerotic heart disease of native coronary artery without angina pectoris: Secondary | ICD-10-CM | POA: Insufficient documentation

## 2011-05-30 DIAGNOSIS — I10 Essential (primary) hypertension: Secondary | ICD-10-CM | POA: Insufficient documentation

## 2011-05-30 DIAGNOSIS — K8020 Calculus of gallbladder without cholecystitis without obstruction: Secondary | ICD-10-CM

## 2011-05-30 DIAGNOSIS — J4489 Other specified chronic obstructive pulmonary disease: Secondary | ICD-10-CM | POA: Insufficient documentation

## 2011-05-30 DIAGNOSIS — I252 Old myocardial infarction: Secondary | ICD-10-CM | POA: Insufficient documentation

## 2011-05-30 HISTORY — PX: CHOLECYSTECTOMY: SHX55

## 2011-05-30 SURGERY — LAPAROSCOPIC CHOLECYSTECTOMY
Anesthesia: General | Site: Abdomen | Wound class: Clean Contaminated

## 2011-05-30 MED ORDER — NEOSTIGMINE METHYLSULFATE 1 MG/ML IJ SOLN
INTRAMUSCULAR | Status: DC | PRN
  Administered 2011-05-30: 3 mg via INTRAVENOUS

## 2011-05-30 MED ORDER — NITROGLYCERIN 0.4 MG/SPRAY TL SOLN
1.0000 | Status: DC | PRN
  Filled 2011-05-30: qty 4.9

## 2011-05-30 MED ORDER — SODIUM CHLORIDE 0.9 % IR SOLN
Status: DC | PRN
  Administered 2011-05-30: 1

## 2011-05-30 MED ORDER — TUBERCULIN PPD 5 UNIT/0.1ML ID SOLN
5.0000 [IU] | Freq: Once | INTRADERMAL | Status: AC
  Administered 2011-05-30: 5 [IU] via INTRADERMAL
  Filled 2011-05-30: qty 0.1

## 2011-05-30 MED ORDER — ASPIRIN 325 MG PO TABS: 325.0000 mg | ORAL_TABLET | Freq: Every day | ORAL | Status: DC

## 2011-05-30 MED ORDER — SODIUM CHLORIDE 0.9 % IV SOLN
10.0000 mg | INTRAVENOUS | Status: DC | PRN
  Administered 2011-05-30: 15 ug/min via INTRAVENOUS

## 2011-05-30 MED ORDER — OXYBUTYNIN CHLORIDE ER 15 MG PO TB24
15.0000 mg | ORAL_TABLET | Freq: Every day | ORAL | Status: DC
  Administered 2011-05-30 – 2011-05-31 (×2): 15 mg via ORAL
  Filled 2011-05-30 (×2): qty 1

## 2011-05-30 MED ORDER — LOSARTAN POTASSIUM 50 MG PO TABS
100.0000 mg | ORAL_TABLET | Freq: Every day | ORAL | Status: DC
Start: 2011-05-30 — End: 2011-05-31
  Administered 2011-05-30 – 2011-05-31 (×2): 100 mg via ORAL
  Filled 2011-05-30 (×2): qty 2

## 2011-05-30 MED ORDER — ONDANSETRON HCL 4 MG/2ML IJ SOLN: 4.0000 mg | Freq: Once | INTRAMUSCULAR | Status: DC | PRN

## 2011-05-30 MED ORDER — GLYCOPYRROLATE 0.2 MG/ML IJ SOLN
INTRAMUSCULAR | Status: DC | PRN
  Administered 2011-05-30: .4 mg via INTRAVENOUS
  Administered 2011-05-30: .2 mg via INTRAVENOUS

## 2011-05-30 MED ORDER — HYDROMORPHONE HCL PF 1 MG/ML IJ SOLN
0.2500 mg | INTRAMUSCULAR | Status: DC | PRN
  Administered 2011-05-30 (×2): 0.25 mg via INTRAVENOUS
  Administered 2011-05-30: 0.5 mg via INTRAVENOUS

## 2011-05-30 MED ORDER — MORPHINE SULFATE 2 MG/ML IJ SOLN: 2.0000 mg | INTRAMUSCULAR | Status: DC | PRN

## 2011-05-30 MED ORDER — LIDOCAINE-EPINEPHRINE 1 %-1:100000 IJ SOLN
INTRAMUSCULAR | Status: DC | PRN
  Administered 2011-05-30: 12 mL

## 2011-05-30 MED ORDER — ONDANSETRON HCL 4 MG/2ML IJ SOLN: 4.0000 mg | Freq: Four times a day (QID) | INTRAMUSCULAR | Status: DC | PRN

## 2011-05-30 MED ORDER — ATORVASTATIN CALCIUM 80 MG PO TABS
80.0000 mg | ORAL_TABLET | Freq: Every day | ORAL | Status: DC
  Administered 2011-05-30: 80 mg via ORAL
  Filled 2011-05-30 (×3): qty 1

## 2011-05-30 MED ORDER — ONDANSETRON HCL 4 MG PO TABS: 4.0000 mg | ORAL_TABLET | Freq: Four times a day (QID) | ORAL | Status: DC | PRN

## 2011-05-30 MED ORDER — HYDROCODONE-ACETAMINOPHEN 5-325 MG PO TABS
1.0000 | ORAL_TABLET | ORAL | Status: DC | PRN
  Administered 2011-05-30 (×2): 2 via ORAL
  Filled 2011-05-30 (×2): qty 2

## 2011-05-30 MED ORDER — EPHEDRINE SULFATE 50 MG/ML IJ SOLN
INTRAMUSCULAR | Status: DC | PRN
  Administered 2011-05-30: 10 mg via INTRAVENOUS

## 2011-05-30 MED ORDER — LACTATED RINGERS IV SOLN
INTRAVENOUS | Status: DC | PRN
  Administered 2011-05-30: 07:00:00 via INTRAVENOUS

## 2011-05-30 MED ORDER — VECURONIUM BROMIDE 10 MG IV SOLR
INTRAVENOUS | Status: DC | PRN
  Administered 2011-05-30: 7 mg via INTRAVENOUS

## 2011-05-30 MED ORDER — FENTANYL CITRATE 0.05 MG/ML IJ SOLN
INTRAMUSCULAR | Status: DC | PRN
  Administered 2011-05-30: 100 ug via INTRAVENOUS

## 2011-05-30 MED ORDER — ASPIRIN EC 325 MG PO TBEC
325.0000 mg | DELAYED_RELEASE_TABLET | Freq: Every day | ORAL | Status: DC
  Administered 2011-05-30 – 2011-05-31 (×2): 325 mg via ORAL
  Filled 2011-05-30 (×2): qty 1

## 2011-05-30 MED ORDER — HEPARIN SODIUM (PORCINE) 5000 UNIT/ML IJ SOLN
5000.0000 [IU] | Freq: Three times a day (TID) | INTRAMUSCULAR | Status: DC
  Administered 2011-05-30 – 2011-05-31 (×2): 5000 [IU] via SUBCUTANEOUS
  Filled 2011-05-30 (×5): qty 1

## 2011-05-30 MED ORDER — PHENYTOIN SODIUM EXTENDED 100 MG PO CAPS
100.0000 mg | ORAL_CAPSULE | Freq: Three times a day (TID) | ORAL | Status: DC
  Administered 2011-05-30 – 2011-05-31 (×3): 100 mg via ORAL
  Filled 2011-05-30 (×6): qty 1

## 2011-05-30 MED ORDER — BUPIVACAINE HCL (PF) 0.25 % IJ SOLN
INTRAMUSCULAR | Status: DC | PRN
  Administered 2011-05-30: 12 mL

## 2011-05-30 MED ORDER — PROPOFOL 10 MG/ML IV BOLUS
INTRAVENOUS | Status: DC | PRN
  Administered 2011-05-30: 120 mg via INTRAVENOUS

## 2011-05-30 MED ORDER — CARVEDILOL 6.25 MG PO TABS
6.2500 mg | ORAL_TABLET | Freq: Two times a day (BID) | ORAL | Status: DC
  Administered 2011-05-30 – 2011-05-31 (×2): 6.25 mg via ORAL
  Filled 2011-05-30 (×5): qty 1

## 2011-05-30 MED ORDER — ONDANSETRON HCL 4 MG/2ML IJ SOLN
INTRAMUSCULAR | Status: DC | PRN
  Administered 2011-05-30: 4 mg via INTRAVENOUS

## 2011-05-30 MED ORDER — SPIRONOLACTONE 25 MG PO TABS
25.0000 mg | ORAL_TABLET | Freq: Every day | ORAL | Status: DC
  Administered 2011-05-31: 25 mg via ORAL
  Filled 2011-05-30: qty 1

## 2011-05-30 SURGICAL SUPPLY — 50 items
ADH SKN CLS APL DERMABOND .7 (GAUZE/BANDAGES/DRESSINGS) ×2
APPLIER CLIP ROT 10 11.4 M/L (STAPLE) ×3
APR CLP MED LRG 11.4X10 (STAPLE) ×2
BAG SPEC RTRVL LRG 6X4 10 (ENDOMECHANICALS) ×2
BLADE SURG ROTATE 9660 (MISCELLANEOUS) ×2 IMPLANT
CANISTER SUCTION 2500CC (MISCELLANEOUS) ×3 IMPLANT
CATH REDDICK CHOLANGI 4FR 50CM (CATHETERS) ×1 IMPLANT
CHLORAPREP W/TINT 26ML (MISCELLANEOUS) ×3 IMPLANT
CLIP APPLIE ROT 10 11.4 M/L (STAPLE) ×2 IMPLANT
CLOTH BEACON ORANGE TIMEOUT ST (SAFETY) ×3 IMPLANT
COVER SURGICAL LIGHT HANDLE (MISCELLANEOUS) ×3 IMPLANT
DECANTER SPIKE VIAL GLASS SM (MISCELLANEOUS) ×4 IMPLANT
DERMABOND ADVANCED (GAUZE/BANDAGES/DRESSINGS) ×1
DERMABOND ADVANCED .7 DNX12 (GAUZE/BANDAGES/DRESSINGS) ×2 IMPLANT
DRAPE C-ARM 42X72 X-RAY (DRAPES) ×3 IMPLANT
ELECT CAUTERY BLADE 6.4 (BLADE) ×3 IMPLANT
ELECT REM PT RETURN 9FT ADLT (ELECTROSURGICAL) ×3
ELECTRODE REM PT RTRN 9FT ADLT (ELECTROSURGICAL) ×2 IMPLANT
GLOVE BIO SURGEON STRL SZ7.5 (GLOVE) ×2 IMPLANT
GLOVE BIOGEL PI IND STRL 7.0 (GLOVE) ×1 IMPLANT
GLOVE BIOGEL PI IND STRL 7.5 (GLOVE) ×2 IMPLANT
GLOVE BIOGEL PI INDICATOR 7.0 (GLOVE) ×1
GLOVE BIOGEL PI INDICATOR 7.5 (GLOVE) ×2
GLOVE ECLIPSE 6.5 STRL STRAW (GLOVE) ×2 IMPLANT
GLOVE SS BIOGEL STRL SZ 7.5 (GLOVE) ×1 IMPLANT
GLOVE SUPERSENSE BIOGEL SZ 7.5 (GLOVE) ×1
GLOVE SURG SS PI 7.5 STRL IVOR (GLOVE) ×8 IMPLANT
GOWN PREVENTION PLUS XLARGE (GOWN DISPOSABLE) ×3 IMPLANT
GOWN STRL NON-REIN LRG LVL3 (GOWN DISPOSABLE) ×11 IMPLANT
HEMOSTAT SURGICEL 2X14 (HEMOSTASIS) ×2 IMPLANT
IV CATH 14GX2 1/4 (CATHETERS) ×3 IMPLANT
KIT BASIN OR (CUSTOM PROCEDURE TRAY) ×3 IMPLANT
KIT ROOM TURNOVER OR (KITS) ×3 IMPLANT
NS IRRIG 1000ML POUR BTL (IV SOLUTION) ×3 IMPLANT
PAD ARMBOARD 7.5X6 YLW CONV (MISCELLANEOUS) ×6 IMPLANT
PENCIL BUTTON HOLSTER BLD 10FT (ELECTRODE) ×3 IMPLANT
POUCH SPECIMEN RETRIEVAL 10MM (ENDOMECHANICALS) ×3 IMPLANT
SCISSORS LAP 5X35 DISP (ENDOMECHANICALS) IMPLANT
SET IRRIG TUBING LAPAROSCOPIC (IRRIGATION / IRRIGATOR) ×3 IMPLANT
SLEEVE ENDOPATH XCEL 5M (ENDOMECHANICALS) ×3 IMPLANT
SPECIMEN JAR SMALL (MISCELLANEOUS) ×3 IMPLANT
SUT MNCRL AB 4-0 PS2 18 (SUTURE) ×3 IMPLANT
SUT VICRYL 0 UR6 27IN ABS (SUTURE) ×4 IMPLANT
TOWEL OR 17X24 6PK STRL BLUE (TOWEL DISPOSABLE) ×3 IMPLANT
TOWEL OR 17X26 10 PK STRL BLUE (TOWEL DISPOSABLE) ×1 IMPLANT
TRAY FOLEY CATH 14FR (SET/KITS/TRAYS/PACK) IMPLANT
TRAY LAPAROSCOPIC (CUSTOM PROCEDURE TRAY) ×3 IMPLANT
TROCAR HASSON GELL 12X100 (TROCAR) ×3 IMPLANT
TROCAR XCEL NON-BLD 11X100MML (ENDOMECHANICALS) ×3 IMPLANT
TROCAR XCEL NON-BLD 5MMX100MML (ENDOMECHANICALS) ×3 IMPLANT

## 2011-05-30 NOTE — H&P (View-Only) (Signed)
Patient ID: [name], male   DOB: 02/10/1933, 78 y.o.   MRN: [id number]  Chief Complaint  Patient presents with  . Abdominal Pain    Evaluate gallbladder    HPI [name] is a 78 y.o. male.  This patient is known to me for prior evaluation of symptomatic cholelithiasis. See his prior H&P from August 2012. At that time we felt that he would benefit from cholecystectomy however, given his cardiac history he requested cardiac clearance which was obtained by Dr. Nishan.  Since he was seen in August he has had multiple episodes of upper abdominal discomfort and this is occurring multiple times per week. He's been in the emergency room approximately 3 times just in the last month for this problem. He has episodes of right upper quadrant pain and most recently said he had some right upper quadrant discomfort this morning. He does have cholelithiasis on 2 prior ultrasound although his most recent LFTs were mostly normal he did have an elevation of one of his transaminases. HPI  Past Medical History  Diagnosis Date  . Hypertension   . Acute MI Dec 2010    cardiac catheriziation and stenting of the LAD (bare metal) 03/14/09              . Seizure disorder     on dilantin  . Vision impairment   . Urethral stricture     with subsequent trauma and has an indwelling Foley catheter in place during his first MI                                                 . Anemia   . NSTEMI (non-ST elevated myocardial infarction) Nov 2011    with LV dysfunction  . LV dysfunction     EF 25 to 30% per echo May 2012  . CAD (coronary artery disease)   . Prostatic hypertrophy   . COPD (chronic obstructive pulmonary disease)     Past Surgical History  Procedure Date  . Coronary stent placement 03/14/2009    Stent to the LAD  . Cardiac catheterization 02/2010    Family History  Problem Relation Age of Onset  . Colon cancer Neg Hx     Social History History  Substance Use Topics  . Smoking status:  Former Smoker -- 1.0 packs/day for 50 years    Types: Cigarettes    Quit date: 08/22/2004  . Smokeless tobacco: Never Used  . Alcohol Use: No    Allergies  Allergen Reactions  . Iodinated Diagnostic Agents Rash    Pt give 50 mg Benadryl prior to Omnipaque injection, tolerated well.    Current Outpatient Prescriptions  Medication Sig Dispense Refill  . aspirin 325 MG tablet Take 325 mg by mouth daily.        . atorvastatin (LIPITOR) 80 MG tablet Take 80 mg by mouth at bedtime.        . carvedilol (COREG) 12.5 MG tablet Take 1 tablet (12.5 mg total) by mouth 2 (two) times daily.  60 tablet  11  . clopidogrel (PLAVIX) 75 MG tablet Take 75 mg by mouth daily.        . ferrous sulfate 325 (65 FE) MG tablet Take 325 mg by mouth daily with breakfast.      . losartan (COZAAR) 100 MG tablet Take 1 tablet (100   mg total) by mouth daily.  30 tablet  11  . nitroGLYCERIN (NITROLINGUAL) 0.4 MG/SPRAY spray Place 1 spray under the tongue every 5 (five) minutes as needed. For chest pain       . oxybutynin (DITROPAN XL) 15 MG 24 hr tablet Take 15 mg by mouth daily.        . phenytoin (DILANTIN) 100 MG ER capsule Take 100 mg by mouth 3 (three) times daily.       . spironolactone (ALDACTONE) 25 MG tablet Take 25 mg by mouth daily.          Review of Systems Review of Systems All other review of systems negative or noncontributory except as stated in the HPI  Blood pressure 140/84, pulse 80, temperature 98.6 F (37 C), temperature source Temporal, resp. rate 18, weight 141 lb (63.957 kg).  Physical Exam Physical Exam Physical Exam  Vitals reviewed. Constitutional: He is oriented to person, place, and time. He appears well-developed and well-nourished. No distress.  HENT:  Head: Normocephalic and atraumatic.  Mouth/Throat: No oropharyngeal exudate.  Eyes: Conjunctivae and EOM are normal. Pupils are equal, round, and reactive to light. Right eye exhibits no discharge. Left eye exhibits no discharge.  No scleral icterus.  Neck: Normal range of motion. No tracheal deviation present.  Cardiovascular: Normal rate, regular rhythm and normal heart sounds.   Pulmonary/Chest: Effort normal and breath sounds normal. No stridor. No respiratory distress. He has no wheezes. He has no rales. He exhibits no tenderness.  Abdominal: Soft. Bowel sounds are normal. He exhibits no distension and no mass. There is no tenderness. There is no rebound and no guarding.  Musculoskeletal: Normal range of motion. He exhibits no edema and no tenderness.  Neurological: He is alert and oriented to person, place, and time.  Skin: Skin is warm and dry. No rash noted. He is not diaphoretic. No erythema. No pallor.  Psychiatric: He has a normal mood and affect. His behavior is normal. Judgment and thought content normal.    Data Reviewed  Assessment    Symptomatic cholelithiasis He has received his cardiac clearance although we realize that he will be high risk for postoperative arrhythmias. Given the frequency of his symptoms as well as the frequency of his emergency room visits, I think that it is reasonable to offer cholecystectomy. His power of attorney was with him during the visit and both of them expressed understanding of the risks of the procedure including the perioperative risks of cardiac palpitations. We also discussed the other risks of the procedure including persistent symptoms in case this is due to other problems such as peptic ulcers disease or cardiac problems. The risks of infection, bleeding, pain, persistent symptoms, scarring, injury to bowel or bile ducts, retained stone, diarrhea, need for additional procedures, and need for open surgery discussed with the patient. They would like to proceed with lap chole for possible relief of his symptoms.     Plan    We will plan for lap chole with IOC as soon as available.  We will plan for overnight admission/observation.      [name]  DAVID 05/11/2011, 8:51 AM    

## 2011-05-30 NOTE — Progress Notes (Signed)
[name], stated she cannot take pt. Home today because she doesn't have anyone to care for him. Tomorrow she states the pt. Will be going to [facility].

## 2011-05-30 NOTE — Preoperative (Signed)
Beta Blockers   Reason not to administer Beta Blockers:Not Applicable 

## 2011-05-30 NOTE — Brief Op Note (Signed)
05/30/2011  9:00 AM  PATIENT:  Rosalia Hammers  78 y.o. male  PRE-OPERATIVE DIAGNOSIS:  cholelithiasis  POST-OPERATIVE DIAGNOSIS:  cholelithiasis  PROCEDURE:  Procedure(s) (LRB): LAPAROSCOPIC CHOLECYSTECTOMY (N/A)  SURGEON:  Surgeon(s) and Role:    * Rulon Abide, DO - Primary  PHYSICIAN ASSISTANT:   ASSISTANTS: Hoxworth   ANESTHESIA:   general  EBL:  Total I/O In: 800 [I.V.:800] Out: 275 [Urine:250; Blood:25]  BLOOD ADMINISTERED:none  DRAINS: none   LOCAL MEDICATIONS USED:  MARCAINE    and LIDOCAINE   SPECIMEN:  Source of Specimen:  gallbladder  DISPOSITION OF SPECIMEN:  PATHOLOGY  COUNTS:  YES  TOURNIQUET:  * No tourniquets in log *  DICTATION: .Other Dictation: Dictation Number [id number]  PLAN OF CARE: Admit for overnight observation  PATIENT DISPOSITION:  PACU - hemodynamically stable.   Delay start of Pharmacological VTE agent (>24hrs) due to surgical blood loss or risk of bleeding: no

## 2011-05-30 NOTE — Transfer of Care (Signed)
Immediate Anesthesia Transfer of Care Note  Patient: [name]  Procedure(s) Performed: Procedure(s) (LRB): LAPAROSCOPIC CHOLECYSTECTOMY (N/A)  Patient Location: PACU  Anesthesia Type: General  Level of Consciousness: awake, alert  and oriented  Airway & Oxygen Therapy: Patient Spontanous Breathing and Patient connected to nasal cannula oxygen  Post-op Assessment: Report given to PACU RN and Post -op Vital signs reviewed and stable  Post vital signs: Reviewed and stable  Complications: No apparent anesthesia complications

## 2011-05-30 NOTE — Interval H&P Note (Signed)
History and Physical Interval Note:  05/30/2011 7:26 AM  Rosalia Hammers  has presented today for surgery, with the diagnosis of cholelithiasis  The various methods of treatment have been discussed with the patient and family. After consideration of risks, benefits and other options for treatment, the patient has consented to  Procedure(s) (LRB): LAPAROSCOPIC CHOLECYSTECTOMY WITH INTRAOPERATIVE CHOLANGIOGRAM (N/A) as a surgical intervention .  The patients' history has been reviewed, patient examined, no change in status, stable for surgery.  I have reviewed the patients' chart and labs.  Questions were answered to the patient's satisfaction.   He still has episodes of epigastric and chest pain.  Has been seen several times in the ER for this.  Even admitted from my office for chest pain but felt to not be cardiac in origin.  He has cholelithiasis and these episodes may or may not be related.  We discussed the risks of the procedure including the risks of persistent symptoms. The risks of infection, bleeding, pain, persistent symptoms, scarring, injury to bowel or bile ducts, retained stone, diarrhea, need for additional procedures, and need for open surgery discussed with the patient.    Lodema Pilot DAVID

## 2011-05-30 NOTE — Progress Notes (Signed)
Pt. Stated he used his nitroglycerine spray yesterday and it resolved his chest pain. He states he gets this pain at times.Cardiologist is  Dr. Estrella Myrtle.

## 2011-05-30 NOTE — Progress Notes (Signed)
Clinical Social Worker completed psychosocial assessment, please see shadow chart for details.  CSW phoned [facility], pt's dtr, and MD.  CSW met with pt at bedside.  All in agreement with plan to dc to [facility].   Angelia Mould, MSW, [place] [phone]

## 2011-05-30 NOTE — Anesthesia Postprocedure Evaluation (Signed)
  Anesthesia Post-op Note  Patient: [name]  Procedure(s) Performed: Procedure(s) (LRB): LAPAROSCOPIC CHOLECYSTECTOMY (N/A)  Patient Location: PACU  Anesthesia Type: General  Level of Consciousness: awake, alert  and patient cooperative  Airway and Oxygen Therapy: Patient Spontanous Breathing and Patient connected to nasal cannula oxygen  Post-op Pain: mild  Post-op Assessment: Post-op Vital signs reviewed, Patient's Cardiovascular Status Stable, Respiratory Function Stable, Patent Airway, No signs of Nausea or vomiting and Pain level controlled  Post-op Vital Signs: stable  Complications: No apparent anesthesia complications

## 2011-05-30 NOTE — Anesthesia Preprocedure Evaluation (Addendum)
Anesthesia Evaluation  Patient identified by MRN, date of birth, ID band Patient awake    Reviewed: Allergy & Precautions, H&P , NPO status , Patient's Chart, lab work & pertinent test results  Airway Mallampati: I TM Distance: >3 FB Neck ROM: full    Dental  (+) Missing and Poor Dentition   Pulmonary COPD         Cardiovascular hypertension, + angina + CAD and + Past MI     Neuro/Psych Seizures -,     GI/Hepatic   Endo/Other    Renal/GU      Musculoskeletal   Abdominal   Peds  Hematology   Anesthesia Other Findings   Reproductive/Obstetrics                          Anesthesia Physical Anesthesia Plan  ASA: IV  Anesthesia Plan: General ETT   Post-op Pain Management:    Induction: Intravenous  Airway Management Planned: Oral ETT  Additional Equipment:   Intra-op Plan:   Post-operative Plan:   Informed Consent: I have reviewed the patients History and Physical, chart, labs and discussed the procedure including the risks, benefits and alternatives for the proposed anesthesia with the patient or authorized representative who has indicated his/her understanding and acceptance.     Plan Discussed with: Anesthesiologist, CRNA and Surgeon  Anesthesia Plan Comments:         Anesthesia Quick Evaluation

## 2011-05-30 NOTE — Progress Notes (Signed)
Pt. Arrived to short stay with an indelling catheter hooked to a leg bag. Pt. Emptied the urine in the bag and I switched over to a foley drainage bag for surgery. Foley draining clear,golden yellow urine.

## 2011-05-30 NOTE — Progress Notes (Signed)
05/30/11 Nsg [id number] 78 year old male, admitted to 89 s/p lap chole, from home with step daughter and plan to go to [facility] at discharge.   A & O x 3, ambulatory with assist,.  IV infusing in right hand.  VSS.  Chronic foley in placed and SCD's on.   Pt. Watched safety video.   Will continue to monitor and assist with d/c planning as pt. progresses.

## 2011-05-31 ENCOUNTER — Encounter (HOSPITAL_COMMUNITY): Payer: PRIVATE HEALTH INSURANCE

## 2011-05-31 MED ORDER — HYDROCODONE-ACETAMINOPHEN 5-325 MG PO TABS: 1.0000 | ORAL_TABLET | ORAL | Status: AC | PRN

## 2011-05-31 NOTE — Op Note (Signed)
[name], [name] NO.:  [id number]  MEDICAL RECORD NO.:  [id number]  LOCATION:  [facility]                         FACILITY:  [facility]  PHYSICIAN:  Lodema Pilot, MD       DATE OF BIRTH:  1932/04/30  DATE OF PROCEDURE:  05/30/2011 DATE OF DISCHARGE:                              OPERATIVE REPORT   PROCEDURE:  Laparoscopic cholecystectomy.  PREOPERATIVE DIAGNOSIS:  Symptomatic cholelithiasis.  POSTOPERATIVE DIAGNOSIS:  Symptomatic cholelithiasis.  SURGEON:  Lodema Pilot, MD  ASSISTANT:  Dr. Sharlet Salina T. Hoxworth.  ANESTHESIA:  General endotracheal anesthesia with 24 mL of 1% lidocaine with epinephrine and 0.25% Marcaine in a 50:50 mixture.  FLUIDS:  800 mL of crystalloid.  ESTIMATED BLOOD LOSS:  Minimal.  DRAINS:  None.  SPECIMENS:  Gallbladder and contents sent to pathology for permanent sectioning.  COMPLICATIONS:  None apparent.  FINDINGS:  Normal-appearing gallbladder anatomy without any significant inflammatory changes.  No IFC was performed due to contrast allergy, but critical view of safety was obtained and clear view of the anatomy due to his thin habitus.  INDICATION FOR PROCEDURE:  Mr. [name] is a 78 year old male who has had recurrent epigastric pain.  It was originally thought to be from cholelithiasis; however, due to his significant cardiac history, cardiac evaluation was sought.  He returned in February after having multiple episodes of abdominal epigastric pain and chest pain which brought him to the emergency room.  In January, his cardiac evaluation has been negative and was thought to be from his gallbladder.  OPERATIVE DETAILS:  Mr. [name] was seen and evaluated in the preoperative area.  Risks and benefits of the procedure were discussed in lay terms. Informed consent was obtained, and prophylactic antibiotics were given, and he was taken to the operating room, placed on table in supine position.  General endotracheal anesthesia was  obtained, he had already had a Foley catheter placed preoperatively and which he states he has had for some time.  His abdomen was prepped and draped in a standard surgical fashion and procedure time-out was performed with all operative team members confirmed proper patient, procedure, and a semicircular infraumbilical incision was made in the skin and dissection carried down through the subcutaneous tissue using blunt dissection.  Abdominal wall fascia was elevated and sharply incised and then the peritoneum was elevated and sharply incised.  The sutures were placed through the fascia and Hasson trocar was placed and pneumoperitoneum was obtained. Laparoscope was introduced which demonstrated no evidence of bowel injury upon entry.  An 11-mm epigastric trocar was placed and two 5-mm right lateral trocars were placed under direct visualization, and gallbladder was retracted cephalad.  He had some peritoneal attachments which were taken down with blunt dissection.  Duodenum was actually closely adherent as well, and this was taken off using sharp dissection. No cautery was used for this part of the procedure.  The gallbladder was retracted laterally and it was very thin and one could actually see his common bile duct and even the cystic duct, common bile duct junction visually.  The peritoneum was taken down from the gallbladder, the triangle of Calot was opened up, and the cystic duct was skeletonized  from the critical view of safety visualizing a single cystic duct entering the gallbladder and single cystic artery coursing up from the gallbladder.  Clips were placed and the liver parenchyma was visualized through the triangle of Calot, and clips were placed on the cystic duct, 2 on the stay side, 1 on the gallbladder side.  Duct was transected. The artery was divided in similar fashion and then the gallbladder __________ from the gallbladder fossa using Bovie electrocautery.   The gallbladder was not entered during the dissection.  The gallbladder was placed in an EndoCatch bag but not removed at this time.  The gallbladder fossa was inspected for hemostasis, and he had some venous bleeding which is controlled with Bovie electrocautery and Surgicel gauze which was placed in the gallbladder fossa.  The gallbladder was then removed from the umbilical trocar site, and he had some palpable stones, and this was passed off the table and sent to pathology for permanent sectioning.  The trocar was placed back at the umbilicus, and the right upper quadrant was inspected for hemostasis which was noted to be adequate.  Surgicel gauze was taken away from the liver, and the liver appeared hemostatic.  The liver bed was irrigated and clots were washed off, and right upper quadrant appeared to be hemostatic.  No evidence of bleeding.  Surgicel gauze was left in the gallbladder fossa. The fluid was suctioned from the right upper quadrant both the irrigation fluid and he actually did have slight amount of ascites as well in the right upper quadrant, but his liver actually appeared fairly normal.  The abdomen was explored, and there were no other obvious abnormalities.  He had indirect and direct right inguinal hernia and indirect left inguinal hernia noted but no evidence of any bowel contents in the hernias.  The right upper quadrant trocars were removed under direct visualization, and abdominal wall was noted be hemostatic. The umbilical fascia was approximated with interrupted 0 Vicryl sutures in an open fashion.  These were secured and the abdomen was re- insufflated.  The epigastric trocar and the umbilical fascia closure was noted be adequate without any evidence of bowel injury.  The right upper quadrant was noted be hemostatic, and the epigastric trocar was removed and the fascia was easily closed with a single interrupted 0 Vicryl suture through the epigastric trocar  site.  The skin edges were injected with total of 24 mL of 1% lidocaine with epinephrine course and Marcaine in a 50: 50 mixture.  Skin edges were approximated with 4-0 Monocryl subcuticular suture.  Skin was washed and dried and Dermabond was applied.  All sponge, needle, instrument counts correct in the case, and the patient tolerated the procedure well without apparent complication. Of note, we did run the pneumoperitoneum in 10-12 mmHg due to the laxity of skin, I gave a sample working space and felt that this might be easier from a cardiac standpoint for him, and again he tolerated the procedure well without any apparent complications.          ______________________________ Lodema Pilot, MD     BL/MEDQ  D:  05/30/2011  T:  05/30/2011  Job:  [id number]

## 2011-05-31 NOTE — Progress Notes (Signed)
[name] to be D/C'd Assisted Living per MD order.  Discussed with the patient and all questions fully answered.   [name], Mans  Home Medication Instructions [id number]   Printed on:05/31/11 1105  Medication Information                    aspirin 325 MG tablet Take 325 mg by mouth daily.             oxybutynin (DITROPAN XL) 15 MG 24 hr tablet Take 15 mg by mouth daily.             losartan (COZAAR) 100 MG tablet Take 1 tablet (100 mg total) by mouth daily.           atorvastatin (LIPITOR) 80 MG tablet Take 80 mg by mouth at bedtime.             nitroGLYCERIN (NITROLINGUAL) 0.4 MG/SPRAY spray Place 1 spray under the tongue every 5 (five) minutes as needed. For chest pain            ferrous sulfate 325 (65 FE) MG tablet Take 325 mg by mouth daily with breakfast.           phenytoin (DILANTIN) 100 MG ER capsule Take 100 mg by mouth 3 (three) times daily.            carvedilol (COREG) 12.5 MG tablet Take 6.25 mg by mouth 2 (two) times daily with a meal.           spironolactone (ALDACTONE) 25 MG tablet TAKE 1 TABLET EACH DAY           clopidogrel (PLAVIX) 75 MG tablet TAKE 1 TABLET EACH DAY TAKE WITH A   MEAL           HYDROcodone-acetaminophen (NORCO) 5-325 MG per tablet Take 1 tablet by mouth every 4 (four) hours as needed.             VVS, Skin clean, dry and intact without evidence of skin break down, no evidence of skin tears noted. IV catheter discontinued intact. Site without signs and symptoms of complications. Dressing and pressure applied.  An After Visit Summary was printed and given to the patient. Patient escorted via Ambulance, and D/C to [facility].  Madelin Rear Ray 05/31/2011 11:05 AM

## 2011-05-31 NOTE — Progress Notes (Signed)
Rosalia Hammers to be D/C'd Skilled nursing facility per MD order.  Discussed with the patient and all questions fully answered.   Joangel, Vanosdol  Home Medication Instructions [id number]   Printed on:05/31/11 1108  Medication Information                    aspirin 325 MG tablet Take 325 mg by mouth daily.             oxybutynin (DITROPAN XL) 15 MG 24 hr tablet Take 15 mg by mouth daily.             losartan (COZAAR) 100 MG tablet Take 1 tablet (100 mg total) by mouth daily.           atorvastatin (LIPITOR) 80 MG tablet Take 80 mg by mouth at bedtime.             nitroGLYCERIN (NITROLINGUAL) 0.4 MG/SPRAY spray Place 1 spray under the tongue every 5 (five) minutes as needed. For chest pain            ferrous sulfate 325 (65 FE) MG tablet Take 325 mg by mouth daily with breakfast.           phenytoin (DILANTIN) 100 MG ER capsule Take 100 mg by mouth 3 (three) times daily.            carvedilol (COREG) 12.5 MG tablet Take 6.25 mg by mouth 2 (two) times daily with a meal.           spironolactone (ALDACTONE) 25 MG tablet TAKE 1 TABLET EACH DAY           clopidogrel (PLAVIX) 75 MG tablet TAKE 1 TABLET EACH DAY TAKE WITH A   MEAL           HYDROcodone-acetaminophen (NORCO) 5-325 MG per tablet Take 1 tablet by mouth every 4 (four) hours as needed.             VVS, Skin clean, dry and intact without evidence of skin break down, no evidence of skin tears noted. IV catheter discontinued intact. Site without signs and symptoms of complications. Dressing and pressure applied. 4 lap sites to abdomen covered with dermabond. Sites clean dry and intact, free of signs of complications. Chronic foley leg bag in place. Stepdaughter at bedside.   An After Visit Summary was printed and placed in packet Patient transferred to [facility] by Ethlyn Gallery, Markeisha Mancias Elizabeth 05/31/2011 11:08 AM

## 2011-05-31 NOTE — Progress Notes (Signed)
1 Day Post-Op  Subjective: No issues.  Pain controlled. Tolerating diet  Objective: Vital signs in last 24 hours: Temp:  [97.5 F (36.4 C)-98.2 F (36.8 C)] 97.7 F (36.5 C) (02/27 0524) Pulse Rate:  [51-79] 51  (02/27 0524) Resp:  [12-20] 17  (02/27 0524) BP: (112-147)/(56-103) 135/76 mmHg (02/27 0524) SpO2:  [92 %-99 %] 96 % (02/27 0524) Weight:  [134 lb 7.7 oz (61 kg)] 134 lb 7.7 oz (61 kg) (02/26 1435)    Intake/Output from previous day: 02/26 0701 - 02/27 0700 In: 800 [I.V.:800] Out: 2450 [Urine:2425; Blood:25] Intake/Output this shift:    General appearance: alert, cooperative and no distress Resp: nonlabored Cardio: HR 51, regular GI: soft, minimal incisional tenderness, ND, no sign of infection  Lab Results:  No results found for this basename: WBC:2,HGB:2,HCT:2,PLT:2 in the last 72 hours BMET No results found for this basename: NA:2,K:2,CL:2,CO2:2,GLUCOSE:2,BUN:2,CREATININE:2,CALCIUM:2 in the last 72 hours PT/INR No results found for this basename: LABPROT:2,INR:2 in the last 72 hours ABG No results found for this basename: PHART:2,PCO2:2,PO2:2,HCO3:2 in the last 72 hours  Studies/Results: No results found.  Anti-infectives: Anti-infectives     Start     Dose/Rate Route Frequency Ordered Stop   05/29/11 1500   ceFAZolin (ANCEF) IVPB 1 g/50 mL premix        1 g 100 mL/hr over 30 Minutes Intravenous 60 min pre-op 05/29/11 1455 05/30/11 0732          Assessment/Plan: s/p Procedure(s) (LRB): LAPAROSCOPIC CHOLECYSTECTOMY (N/A) Doing great.  Plan for transfer to assisted living today.  LOS: 1 day    Lodema Pilot DAVID 05/31/2011

## 2011-05-31 NOTE — Progress Notes (Signed)
Clinical Social Worker spoke with RN and reviewed chart.  CSW phoned [facility] and submitted all appropriate paperwork (FL2&DC Summary to fax [phone]).  ALF to phone CSW when room is ready for pt.  CSW to continue to follow and assist as needed.  Angelia Mould, MSW, [place] [phone]

## 2011-05-31 NOTE — Discharge Summary (Signed)
  Physician Discharge Summary  Patient ID: [name] MRN: [id number] DOB/AGE: [birth year] 76 y.o.  Admit date: 05/30/2011 Discharge date: 05/31/2011  Admission Diagnoses: cholelithiasis  Discharge Diagnoses: cholelithiasis Active Problems:  * No active hospital problems. *    Discharged Condition: good  Hospital Course: to OR 05/30/11 for lap cholecystectomy, no complications.  Admitted for overnight observation due to cardiac history.  No issues overnight.  Pain controlled and tolerating regular diet and stable for discharge on POD 1  Consults: None  Significant Diagnostic Studies: none  Treatments: surgery: lap cholecystectomy 05/30/11  Disposition: 01-Home or Self Care, [facility] assisted living  Discharge Orders    Future Orders Please Complete By Expires   Diet - low sodium heart healthy      Increase activity slowly      Discharge instructions      Comments:   May shower Thursday. Call [phone] for follow up appointment with Dr. Biagio Quint in 3 weeks Diet as tolerated.   Call MD for:  temperature >100.4      Call MD for:  persistant nausea and vomiting      Call MD for:  severe uncontrolled pain      Call MD for:  redness, tenderness, or signs of infection (pain, swelling, redness, odor or green/yellow discharge around incision site)         [name], [name]  Home Medication Instructions [id number]   Printed on:05/31/11 0817  Medication Information                    aspirin 325 MG tablet Take 325 mg by mouth daily.             oxybutynin (DITROPAN XL) 15 MG 24 hr tablet Take 15 mg by mouth daily.             losartan (COZAAR) 100 MG tablet Take 1 tablet (100 mg total) by mouth daily.           atorvastatin (LIPITOR) 80 MG tablet Take 80 mg by mouth at bedtime.             nitroGLYCERIN (NITROLINGUAL) 0.4 MG/SPRAY spray Place 1 spray under the tongue every 5 (five) minutes as needed. For chest pain            ferrous sulfate 325 (65 FE) MG  tablet Take 325 mg by mouth daily with breakfast.           phenytoin (DILANTIN) 100 MG ER capsule Take 100 mg by mouth 3 (three) times daily.            carvedilol (COREG) 12.5 MG tablet Take 6.25 mg by mouth 2 (two) times daily with a meal.           spironolactone (ALDACTONE) 25 MG tablet TAKE 1 TABLET EACH DAY           clopidogrel (PLAVIX) 75 MG tablet TAKE 1 TABLET EACH DAY **TAKE WITH A   MEAL**           HYDROcodone-acetaminophen (NORCO) 5-325 MG per tablet Take 1 tablet by mouth every 4 (four) hours as needed.             SignedLodema Pilot DAVID 05/31/2011, 8:12 AM

## 2011-06-01 ENCOUNTER — Encounter (HOSPITAL_COMMUNITY): Payer: Self-pay | Admitting: General Surgery

## 2011-06-02 ENCOUNTER — Encounter (HOSPITAL_COMMUNITY): Payer: PRIVATE HEALTH INSURANCE

## 2011-06-05 ENCOUNTER — Encounter (HOSPITAL_COMMUNITY): Payer: PRIVATE HEALTH INSURANCE

## 2011-06-07 ENCOUNTER — Encounter (HOSPITAL_COMMUNITY): Payer: PRIVATE HEALTH INSURANCE

## 2011-06-26 ENCOUNTER — Telehealth (INDEPENDENT_AMBULATORY_CARE_PROVIDER_SITE_OTHER): Payer: Self-pay | Admitting: General Surgery

## 2011-06-27 ENCOUNTER — Telehealth (INDEPENDENT_AMBULATORY_CARE_PROVIDER_SITE_OTHER): Payer: Self-pay

## 2011-06-27 NOTE — Telephone Encounter (Signed)
Called Diego Cory with Mr. Sterling po appt date & time for 07/18/11 w/Dr. Biagio Quint

## 2011-07-14 ENCOUNTER — Encounter (INDEPENDENT_AMBULATORY_CARE_PROVIDER_SITE_OTHER): Payer: Medicare Other | Admitting: General Surgery

## 2011-07-18 ENCOUNTER — Ambulatory Visit (INDEPENDENT_AMBULATORY_CARE_PROVIDER_SITE_OTHER): Payer: Medicare Other | Admitting: General Surgery

## 2011-07-18 VITALS — BP 132/72 | HR 68 | Temp 98.1°F | Resp 16 | Wt 134.4 lb

## 2011-07-18 DIAGNOSIS — Z4889 Encounter for other specified surgical aftercare: Secondary | ICD-10-CM

## 2011-07-21 ENCOUNTER — Encounter (INDEPENDENT_AMBULATORY_CARE_PROVIDER_SITE_OTHER): Payer: Medicare Other | Admitting: General Surgery

## 2011-07-25 ENCOUNTER — Encounter (INDEPENDENT_AMBULATORY_CARE_PROVIDER_SITE_OTHER): Payer: Medicare Other | Admitting: General Surgery

## 2011-08-14 ENCOUNTER — Telehealth: Payer: Self-pay | Admitting: Cardiovascular Disease

## 2011-08-14 MED ORDER — NITROGLYCERIN 0.4 MG/SPRAY TL SOLN: 1.0000 | Status: DC | PRN

## 2011-08-14 NOTE — Telephone Encounter (Signed)
New message:  Pt has a prescription for Nitro (pills).  He needs this in a spray.  Please fax to the facility that he is in.  Fax number is [phone] [facility].  Pt is legally blind and can only [place] the spray.

## 2011-08-15 NOTE — Telephone Encounter (Signed)
SCRIPT FOR NTG FAXED YESTERDAY  AT LISTED FAX NUMBER ./CY

## 2012-03-06 ENCOUNTER — Encounter (HOSPITAL_COMMUNITY): Payer: Self-pay | Admitting: *Deleted

## 2012-03-06 ENCOUNTER — Inpatient Hospital Stay (HOSPITAL_COMMUNITY)
Admission: EM | Admit: 2012-03-06 | Discharge: 2012-03-08 | DRG: 378 | Disposition: A | Discharge status: Nursing Home (Includes ICF) | Payer: Medicare Other | Attending: Internal Medicine | Admitting: Internal Medicine

## 2012-03-06 DIAGNOSIS — K625 Hemorrhage of anus and rectum: Secondary | ICD-10-CM

## 2012-03-06 DIAGNOSIS — N390 Urinary tract infection, site not specified: Secondary | ICD-10-CM | POA: Diagnosis present

## 2012-03-06 DIAGNOSIS — I110 Hypertensive heart disease with heart failure: Secondary | ICD-10-CM | POA: Diagnosis present

## 2012-03-06 DIAGNOSIS — J4489 Other specified chronic obstructive pulmonary disease: Secondary | ICD-10-CM | POA: Diagnosis present

## 2012-03-06 DIAGNOSIS — G40909 Epilepsy, unspecified, not intractable, without status epilepticus: Secondary | ICD-10-CM

## 2012-03-06 DIAGNOSIS — I252 Old myocardial infarction: Secondary | ICD-10-CM

## 2012-03-06 DIAGNOSIS — I2510 Atherosclerotic heart disease of native coronary artery without angina pectoris: Secondary | ICD-10-CM | POA: Diagnosis present

## 2012-03-06 DIAGNOSIS — H548 Legal blindness, as defined in USA: Secondary | ICD-10-CM | POA: Diagnosis present

## 2012-03-06 DIAGNOSIS — D638 Anemia in other chronic diseases classified elsewhere: Secondary | ICD-10-CM | POA: Diagnosis present

## 2012-03-06 DIAGNOSIS — D62 Acute posthemorrhagic anemia: Secondary | ICD-10-CM | POA: Diagnosis present

## 2012-03-06 DIAGNOSIS — I10 Essential (primary) hypertension: Secondary | ICD-10-CM | POA: Diagnosis present

## 2012-03-06 DIAGNOSIS — K5731 Diverticulosis of large intestine without perforation or abscess with bleeding: Principal | ICD-10-CM | POA: Diagnosis present

## 2012-03-06 DIAGNOSIS — Z87891 Personal history of nicotine dependence: Secondary | ICD-10-CM

## 2012-03-06 DIAGNOSIS — K922 Gastrointestinal hemorrhage, unspecified: Secondary | ICD-10-CM | POA: Diagnosis present

## 2012-03-06 DIAGNOSIS — F0390 Unspecified dementia without behavioral disturbance: Secondary | ICD-10-CM | POA: Diagnosis present

## 2012-03-06 DIAGNOSIS — J449 Chronic obstructive pulmonary disease, unspecified: Secondary | ICD-10-CM | POA: Diagnosis present

## 2012-03-06 DIAGNOSIS — D649 Anemia, unspecified: Secondary | ICD-10-CM

## 2012-03-06 DIAGNOSIS — E7800 Pure hypercholesterolemia, unspecified: Secondary | ICD-10-CM | POA: Diagnosis present

## 2012-03-06 LAB — CBC WITH DIFFERENTIAL/PLATELET
Basophils Absolute: 0.1 10*3/uL (ref 0.0–0.1)
Basophils Relative: 1 % (ref 0–1)
Eosinophils Absolute: 0.5 10*3/uL (ref 0.0–0.7)
HCT: 31.2 % — ABNORMAL LOW (ref 39.0–52.0)
Hemoglobin: 10.2 g/dL — ABNORMAL LOW (ref 13.0–17.0)
MCH: 29.9 pg (ref 26.0–34.0)
MCHC: 32.7 g/dL (ref 30.0–36.0)
Monocytes Absolute: 1 10*3/uL (ref 0.1–1.0)
Monocytes Relative: 7 % (ref 3–12)
Neutro Abs: 10.3 10*3/uL — ABNORMAL HIGH (ref 1.7–7.7)
RDW: 12.1 % (ref 11.5–15.5)

## 2012-03-06 LAB — COMPREHENSIVE METABOLIC PANEL
AST: 18 U/L (ref 0–37)
Albumin: 3.6 g/dL (ref 3.5–5.2)
BUN: 21 mg/dL (ref 6–23)
Calcium: 9.1 mg/dL (ref 8.4–10.5)
Chloride: 104 mEq/L (ref 96–112)
Creatinine, Ser: 1.02 mg/dL (ref 0.50–1.35)
Total Bilirubin: 0.3 mg/dL (ref 0.3–1.2)
Total Protein: 6.8 g/dL (ref 6.0–8.3)

## 2012-03-06 LAB — TYPE AND SCREEN
ABO/RH(D): O POS
Antibody Screen: NEGATIVE

## 2012-03-06 MED ORDER — METRONIDAZOLE IN NACL 5-0.79 MG/ML-% IV SOLN
500.0000 mg | Freq: Once | INTRAVENOUS | Status: AC
  Administered 2012-03-06: 500 mg via INTRAVENOUS
  Filled 2012-03-06: qty 100

## 2012-03-06 MED ORDER — CIPROFLOXACIN IN D5W 400 MG/200ML IV SOLN
400.0000 mg | Freq: Once | INTRAVENOUS | Status: AC
  Administered 2012-03-07: 400 mg via INTRAVENOUS
  Filled 2012-03-06: qty 200

## 2012-03-06 NOTE — ED Notes (Signed)
diarrhea since around noon today, also reports some bright red blood when he wipes- reports having large hemmroid. Pt is from [facility]

## 2012-03-06 NOTE — ED Provider Notes (Signed)
History     CSN: [id number]  Arrival date & time 03/06/12  7829   First MD Initiated Contact with Patient 03/06/12 1945      Chief Complaint  Patient presents with  . Diarrhea    (Consider location/radiation/quality/duration/timing/severity/associated sxs/prior treatment) HPI Comments: Patient a resident of an ecf.  Was brought here after having several bloody bowel movements this evening.  He denies chest pain, shortness of breath.  No abd pain.  He does report having a hemorrhoid but it is not giving him any discomfort right now.    Patient is a 78 y.o. male presenting with diarrhea. The history is provided by the patient.  Diarrhea The primary symptoms include diarrhea. Primary symptoms do not include fever, nausea or vomiting. The onset was sudden. The problem has not changed since onset. The illness does not include chills.    Past Medical History  Diagnosis Date  . Hypertension   . Vision impairment     "legally blind"  . Urethral stricture     with subsequent trauma and has an indwelling Foley catheter in place during his first MI                                                 . Anemia   . LV dysfunction     EF 25 to 30% per echo May 2012  . CAD (coronary artery disease)   . Prostatic hypertrophy   . COPD (chronic obstructive pulmonary disease)   . High cholesterol   . Acute MI Dec 2010    cardiac catheriziation and stenting of the LAD (bare metal) 03/14/09              . NSTEMI (non-ST elevated myocardial infarction) Nov 2011    with LV dysfunction  . Angina   . Seizures ~ 2006    "when blood pressure went up too high"; on Dilantin  . Blood transfusion   . Urinary catheter in place     chronic  . DEMENTIA     Past Surgical History  Procedure Date  . Tonsillectomy     "when I was small"  . 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 Cardiac catheterization 02/2010  . Coronary angioplasty with stent placement 03/2009     "1"; stent to LAD  . Cholecystectomy 05/30/11  . Cholecystectomy 05/30/2011    Procedure: LAPAROSCOPIC CHOLECYSTECTOMY;  Surgeon: Rulon Abide, DO;  Location: [facility];  Service: General;  Laterality: N/A;    Family History  Problem Relation Age of Onset  . Colon cancer Neg Hx     History  Substance Use Topics  . Smoking status: Former Smoker -- 1.0 packs/day for 50 years    Types: Cigarettes    Quit date: 08/22/2004  . Smokeless tobacco: Never Used  . Alcohol Use: Yes     Comment: "quit drinking 1980's"      Review of Systems  Constitutional: Negative for fever and chills.  Gastrointestinal: Positive for diarrhea. Negative for nausea and vomiting.  All other systems reviewed and are negative.    Allergies  Red dye and Iodinated diagnostic agents  Home Medications     BP 144/90  Pulse 84  Temp 98.4 F (36.9 C) (Oral)  Resp 16  SpO2 94%  Physical  Exam  Nursing note and vitals reviewed. Constitutional: He is oriented to person, place, and time. He appears well-developed and well-nourished. No distress.  HENT:  Head: Normocephalic and atraumatic.  Mouth/Throat: Oropharynx is clear and moist.  Neck: Normal range of motion. Neck supple.  Cardiovascular: Normal rate and regular rhythm.   No murmur heard. Pulmonary/Chest: Effort normal and breath sounds normal. No respiratory distress. He has no wheezes.  Abdominal: Soft. Bowel sounds are normal. He exhibits no distension. There is no tenderness.       There is a hemorrhoid present but no active bleeding.  DRE does reveal maroon-colored bloody stool.  Musculoskeletal: Normal range of motion. He exhibits no edema.  Neurological: He is alert and oriented to person, place, and time.  Skin: Skin is warm and dry. He is not diaphoretic.    ED Course  Procedures (including critical care time)   Labs Reviewed  CBC WITH DIFFERENTIAL  COMPREHENSIVE METABOLIC PANEL   No results found.   No diagnosis  found.    MDM  The patient was found to have an elevated wbc, repeat episodes of bloody stool, and mild anemia.  I have spoken with Dr. Christella Hartigan from GI who wants the patient admitted to [facility].  I have given cipro and flagyl due to the wbc and history of diverticular disease.        Geoffery Lyons, MD 03/07/12 0001

## 2012-03-06 NOTE — H&P (Signed)
PCP:   Hoyle Sauer, MD   Chief Complaint:  Bloody stool  HPI: 78 yo male with sudden onset of brbpr started around 5pm today at the snf.  Over 5 stools loose and bloody.  No abd pain.  No n/v or fever.  H/o diverticular bleed one year ago.  hgb over 10 but has had several bloody stools while in ED but it has slowed down some.  Review of Systems:  O/w neg  Past Medical History: Past Medical History  Diagnosis Date  . Hypertension   . Vision impairment     "legally blind"  . Urethral stricture     with subsequent trauma and has an indwelling Foley catheter in place during his first MI                                                 . Anemia   . LV dysfunction     EF 25 to 30% per echo May 2012  . CAD (coronary artery disease)   . Prostatic hypertrophy   . COPD (chronic obstructive pulmonary disease)   . High cholesterol   . Acute MI Dec 2010    cardiac catheriziation and stenting of the LAD (bare metal) 03/14/09              . NSTEMI (non-ST elevated myocardial infarction) Nov 2011    with LV dysfunction  . Angina   . Seizures ~ 2006    "when blood pressure went up too high"; on Dilantin  . Blood transfusion   . Urinary catheter in place     chronic  . DEMENTIA    Past Surgical History  Procedure Date  . Tonsillectomy     "when I was small"  . 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 Cardiac catheterization 02/2010  . Coronary angioplasty with stent placement 03/2009    "1"; stent to LAD  . Cholecystectomy 05/30/11  . Cholecystectomy 05/30/2011    Procedure: LAPAROSCOPIC CHOLECYSTECTOMY;  Surgeon: Rulon Abide, DO;  Location: [facility] OR;  Service: General;  Laterality: N/A;    Medications: Prior to Admission medications   Medication Sig Start Date End Date Taking? Authorizing Provider  amoxicillin (AMOXIL) 500 MG capsule Take 500 mg by mouth 3 (three) times daily. Patient completed therapy on 03/05/12   Yes Historical  Provider, MD  aspirin 325 MG tablet Take 325 mg by mouth daily.     Yes Historical Provider, MD  atorvastatin (LIPITOR) 10 MG tablet Take 10 mg by mouth daily.   Yes Historical Provider, MD  carvedilol (COREG) 12.5 MG tablet Take 12.5 mg by mouth daily.  05/12/11  Yes Ok Anis, NP  chlorhexidine (PERIDEX) 0.12 % solution Use as directed 15 mLs in the mouth or throat 2 (two) times daily.   Yes Historical Provider, MD  cholecalciferol (VITAMIN D) 1000 UNITS tablet Take 1,000 Units by mouth daily.   Yes Historical Provider, MD  clopidogrel (PLAVIX) 75 MG tablet TAKE 1 TABLET EACH DAY TAKE WITH A   MEAL 05/25/11  Yes Wendall Stade, MD  ferrous sulfate 325 (65 FE) MG tablet Take 325 mg by mouth 2 (two) times daily.    Yes Historical Provider, MD  HYDROcodone-acetaminophen (NORCO) 10-325 MG per tablet Take 1 tablet by mouth  every 4 (four) hours as needed. For pain.   Yes Historical Provider, MD  hydrocortisone cream (PREPARATION H HYDROCORTISONE) 1 % Apply 1 application topically 2 (two) times daily as needed. For hemmoroids.   Yes Historical Provider, MD  losartan (COZAAR) 50 MG tablet Take 50 mg by mouth daily.   Yes Historical Provider, MD  nitroGLYCERIN (NITROSTAT) 0.4 MG SL tablet Place 0.4 mg under the tongue every 5 (five) minutes as needed. For chest pains.   Yes Historical Provider, MD  oxybutynin (DITROPAN XL) 15 MG 24 hr tablet Take 15 mg by mouth daily.     Yes Historical Provider, MD  phenazopyridine (PYRIDIUM) 200 MG tablet Take 200 mg by mouth 2 (two) times daily.   Yes Historical Provider, MD  phenytoin (DILANTIN) 100 MG ER capsule Take 200 mg by mouth 2 (two) times daily.   Yes Historical Provider, MD  spironolactone (ALDACTONE) 25 MG tablet TAKE 1 TABLET EACH DAY 05/25/11  Yes Wendall Stade, MD  nitroGLYCERIN (NITROLINGUAL) 0.4 MG/SPRAY spray Place 1 spray under the tongue every 5 (five) minutes as needed. For chest pain 08/14/11   Wendall Stade, MD    Allergies:   Allergies   Allergen Reactions  . Red Dye Other (See Comments)    Per Mar.  . Iodinated Diagnostic Agents Rash    Pt give 50 mg Benadryl prior to Omnipaque injection, tolerated well.    Social History:  reports that he quit smoking about 7 years ago. His smoking use included Cigarettes. He has a 50 pack-year smoking history. He has never used smokeless tobacco. He reports that he drinks alcohol. He reports that he does not use illicit drugs.  Family History: Family History  Problem Relation Age of Onset  . Colon cancer Neg Hx     Physical Exam: Filed Vitals:   03/06/12 1939  BP: 144/90  Pulse: 84  Temp: 98.4 F (36.9 C)  TempSrc: Oral  Resp: 16  SpO2: 94%   General appearance: alert, cooperative and no distress Neck: no JVD and supple, symmetrical, trachea midline Lungs: clear to auscultation bilaterally Heart: regular rate and rhythm, S1, S2 normal, no murmur, click, rub or gallop Abdomen: soft, non-tender; bowel sounds normal; no masses,  no organomegaly Extremities: extremities normal, atraumatic, no cyanosis or edema Pulses: 2+ and symmetric Skin: Skin color, texture, turgor normal. No rashes or lesions Neurologic: Grossly normal    Labs on Admission:   [facility] 03/06/12 2045  NA 138  K 4.2  CL 104  CO2 25  GLUCOSE 116*  BUN 21  CREATININE 1.02  CALCIUM 9.1  MG --  PHOS --    Basename 03/06/12 2045  AST 18  ALT 14  ALKPHOS 70  BILITOT 0.3  PROT 6.8  ALBUMIN 3.6    Basename 03/06/12 2045  WBC 13.3*  NEUTROABS 10.3*  HGB 10.2*  HCT 31.2*  MCV 91.5  PLT 277   Radiological Exams on Admission: No results found.  Assessment/Plan 78 yo male with acute brbpr likely diverticular bleed active  Principal Problem:  *BRBPR (bright red blood per rectum) Active Problems:  Hypertension  Anemia  CAD (coronary artery disease)  Place in stepdown.  q6hr h/h and transfuse if hgb less than 8.  hhold plavix and asa.  Rectal exam grossly bloody with edp exam.   Vss.  Gi called and will see in am.  Ivf.  Hold most meds and keep npo.  DAVID,RACHAL A 03/06/2012, 9:59 PM

## 2012-03-06 NOTE — ED Notes (Signed)
[id number]<GN> Expected date:<BR> Expected time:<BR> Means of arrival:<BR> Comments:<BR> ems

## 2012-03-07 DIAGNOSIS — K922 Gastrointestinal hemorrhage, unspecified: Secondary | ICD-10-CM

## 2012-03-07 DIAGNOSIS — N390 Urinary tract infection, site not specified: Secondary | ICD-10-CM | POA: Diagnosis present

## 2012-03-07 DIAGNOSIS — I10 Essential (primary) hypertension: Secondary | ICD-10-CM

## 2012-03-07 LAB — URINALYSIS, ROUTINE W REFLEX MICROSCOPIC
Bilirubin Urine: NEGATIVE
Ketones, ur: NEGATIVE mg/dL
Nitrite: POSITIVE — AB
Protein, ur: NEGATIVE mg/dL
pH: 6 (ref 5.0–8.0)

## 2012-03-07 LAB — BASIC METABOLIC PANEL
BUN: 17 mg/dL (ref 6–23)
Creatinine, Ser: 0.86 mg/dL (ref 0.50–1.35)
GFR calc Af Amer: >90 mL/min (ref >90)
GFR calc non Af Amer: 82 mL/min — ABNORMAL LOW (ref >90)
Potassium: 4 mEq/L (ref 3.5–5.1)

## 2012-03-07 LAB — URINE MICROSCOPIC-ADD ON

## 2012-03-07 LAB — PROTIME-INR: Prothrombin Time: 15 seconds (ref 11.6–15.2)

## 2012-03-07 LAB — HEMOGLOBIN AND HEMATOCRIT, BLOOD
HCT: 25.1 % — ABNORMAL LOW (ref 39.0–52.0)
HCT: 24 % — ABNORMAL LOW (ref 39.0–52.0)
Hemoglobin: 8.5 g/dL — ABNORMAL LOW (ref 13.0–17.0)
Hemoglobin: 8 g/dL — ABNORMAL LOW (ref 13.0–17.0)

## 2012-03-07 MED ORDER — SODIUM CHLORIDE 0.9 % IV SOLN
INTRAVENOUS | Status: AC
  Administered 2012-03-07: 100 mL via INTRAVENOUS
  Administered 2012-03-07 (×2): via INTRAVENOUS

## 2012-03-07 MED ORDER — CARVEDILOL 12.5 MG PO TABS
12.5000 mg | ORAL_TABLET | Freq: Every day | ORAL | Status: DC
  Administered 2012-03-07 – 2012-03-08 (×2): 12.5 mg via ORAL
  Filled 2012-03-07 (×2): qty 1

## 2012-03-07 MED ORDER — PHENYTOIN SODIUM EXTENDED 100 MG PO CAPS
200.0000 mg | ORAL_CAPSULE | Freq: Two times a day (BID) | ORAL | Status: DC
  Administered 2012-03-07 – 2012-03-08 (×2): 200 mg via ORAL
  Filled 2012-03-07 (×3): qty 2

## 2012-03-07 MED ORDER — SODIUM CHLORIDE 0.9 % IV SOLN: INTRAVENOUS | Status: AC

## 2012-03-07 NOTE — Progress Notes (Signed)
Pt transferred to unit from ICU step down. Assessment completed and pt laying comfortably in bed. No complaints or signs of distress at this time. Will continue to monitor. - Christell Faith, RN

## 2012-03-07 NOTE — Progress Notes (Signed)
[name] PROGRESS NOTE  [name] ZOX:[id number] DOB: 17-Apr-1932 DOA: 03/06/2012 PCP: Hoyle Sauer, MD  Assessment/Plan: Principal Problem:  *BRBPR (bright red blood per rectum): Hemoglobin did drop today, but now otherwise stable. Vital signs stable. We'll change next hemoglobin checked to be tomorrow morning. GI following. We'll transfer to floor with telemetry bed. If stable, can discharge back tomorrow after dancing diet Active Problems:  Hypertension: Monitor blood pressure of medications given bleed  Seizure disorder: Stable  Anemia  CAD (coronary artery disease): Stable  UTI (lower urinary tract infection): We'll start IV Rocephin   Code Status: Full code Family Communication: Discussed plan with patient Disposition Plan: Possibly back to the facility tomorrow   Consultants:  [facility]  Procedures:  None  Antibiotics:  None  HPI/Subjective: Patient feeling okay. Has not had any further bloody bowel movements today. Is having some gas. Tolerating clear liquids  Objective: Filed Vitals:   03/07/12 1100 03/07/12 1200 03/07/12 1300 03/07/12 1400  BP: 123/69  119/64 107/75  Pulse: 78 77 78 67  Temp:  98.4 F (36.9 C)    TempSrc:      Resp:  19 20 16   Height:      Weight:      SpO2: 98% 99% 98% 99%    Intake/Output Summary (Last 24 hours) at 03/07/12 1735 Last data filed at 03/07/12 1700  Gross per 24 hour  Intake    925 ml  Output   1575 ml  Net   -650 ml   Filed Weights   03/07/12 1013  Weight: 60.1 kg (132 lb 7.9 oz)    Exam:   General:  Alert and oriented x2, no acute distress  Cardiovascular: Regular rate and rhythm, S1-S2  Respiratory: Clear to auscultation bilaterally  Abdomen: Soft, nondistended, nontender, hyperactive bowel sounds  Extremities: No clubbing or cyanosis, trace pitting edema  Data Reviewed: Basic Metabolic Panel:  Lab 03/07/12 8119 03/06/12 2045  NA 138 138  K 4.0 4.2  CL 107 104  CO2 25 25   GLUCOSE 87 116*  BUN 17 21  CREATININE 0.86 1.02  CALCIUM 8.5 9.1  MG -- --  PHOS -- --   Liver Function Tests:  Lab 03/06/12 2045  AST 18  ALT 14  ALKPHOS 70  BILITOT 0.3  PROT 6.8  ALBUMIN 3.6   CBC:  Lab 03/07/12 1205 03/07/12 0547 03/07/12 0210 03/06/12 2045  WBC -- -- -- 13.3*  NEUTROABS -- -- -- 10.3*  HGB 8.0* 8.0* 8.5* 10.2*  HCT 24.0* 23.9* 25.1* 31.2*  MCV -- -- -- 91.5  PLT -- -- -- 277     Recent Results (from the past 240 hour(s))  MRSA PCR SCREENING     Status: Normal   Collection Time   03/07/12 12:02 PM      Component Value Range Status Comment   MRSA by PCR NEGATIVE  NEGATIVE Final      Studies: No results found.  Scheduled Meds:   . sodium chloride   Intravenous STAT  . carvedilol  12.5 mg Oral Daily  . [COMPLETED] ciprofloxacin  400 mg Intravenous Once  . [COMPLETED] metronidazole  500 mg Intravenous Once   Continuous Infusions:   . [EXPIRED] sodium chloride 100 mL (03/07/12 1300)    Principal Problem:  *BRBPR (bright red blood per rectum) Active Problems:  Hypertension  Seizure disorder  Anemia  CAD (coronary artery disease)  UTI (lower urinary tract infection)    Time spent: 25 minutes  Hollice Espy  [name] Pager [phone]. If 8PM-8AM, please contact night-coverage at [email or web address], password [facility] 03/07/2012, 5:35 PM  LOS: 1 day

## 2012-03-07 NOTE — ED Notes (Signed)
Called to give report to the floor. Nurse Nellie stated she would have to call me back to get report bc she is unavailable at this time.

## 2012-03-07 NOTE — Consult Note (Signed)
Referring Provider: No ref. provider found Primary Care Physician:  Hoyle Sauer, MD Primary Gastroenterologist:  Dr. Arlyce Dice  Reason for Consultation:  LGIB  HPI: [name] is a 78 y.o. male who is a resident at [facility] living who is on asa 325 mg and plavix for CAD.  He presented to [facility] ED last night on 12/4 after developing rectal bleeding.  Describes it as red blood.  Says that he was constipated not too long ago, but then had developed diarrhea.  Never had bleeding like this in the past.  Hgb in the ED was 10.2 grams (usually runs between 10-11 gram range).  Has subsequently dropped to 8.0 grams.  Has not been transfused.  Takes chronic iron supplements at home.  Has not experienced any more bleeding since last night.  Denies abdominal pain, nausea, vomiting.  Says that he has been passing flatus but no blood or stool has come with the gas.  ASA and plavix have been held.  Colonoscopy 02/2011 showed severe diverticulosis in the sigmoid colon and one sessile polyp along with internal hemorrhoids.   Past Medical History  Diagnosis Date  . Hypertension   . Vision impairment     "legally blind"  . Urethral stricture     with subsequent trauma and has an indwelling Foley catheter in place during his first MI                                                 . Anemia   . LV dysfunction     EF 25 to 30% per echo May 2012  . CAD (coronary artery disease)   . Prostatic hypertrophy   . COPD (chronic obstructive pulmonary disease)   . High cholesterol   . Acute MI Dec 2010    cardiac catheriziation and stenting of the LAD (bare metal) 03/14/09              . NSTEMI (non-ST elevated myocardial infarction) Nov 2011    with LV dysfunction  . Angina   . Seizures ~ 2006    "when blood pressure went up too high"; on Dilantin  . Blood transfusion   . Urinary catheter in place     chronic  . DEMENTIA     Past Surgical History  Procedure Date  . Tonsillectomy      "when I was small"  . 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 Cardiac catheterization 02/2010  . Coronary angioplasty with stent placement 03/2009    "1"; stent to LAD  . Cholecystectomy 05/30/2011    Procedure: LAPAROSCOPIC CHOLECYSTECTOMY;  Surgeon: Rulon Abide, DO;  Location: [facility] OR;  Service: General;  Laterality: N/A;    Prior to Admission medications   Medication Sig Start Date End Date Taking? Authorizing Provider  amoxicillin (AMOXIL) 500 MG capsule Take 500 mg by mouth 3 (three) times daily. Patient completed therapy on 03/05/12   Yes Historical Provider, MD  aspirin 325 MG tablet Take 325 mg by mouth daily.     Yes Historical Provider, MD  atorvastatin (LIPITOR) 10 MG tablet Take 10 mg by mouth daily.   Yes Historical Provider, MD  carvedilol (COREG) 12.5 MG tablet Take 12.5 mg by mouth daily.  05/12/11  Yes Ok Anis, NP  chlorhexidine (PERIDEX) 0.12 % solution Use as directed 15 mLs in the mouth or throat 2 (two) times daily.   Yes Historical Provider, MD  cholecalciferol (VITAMIN D) 1000 UNITS tablet Take 1,000 Units by mouth daily.   Yes Historical Provider, MD  clopidogrel (PLAVIX) 75 MG tablet TAKE 1 TABLET EACH DAY TAKE WITH A   MEAL 05/25/11  Yes Wendall Stade, MD  ferrous sulfate 325 (65 FE) MG tablet Take 325 mg by mouth 2 (two) times daily.    Yes Historical Provider, MD  HYDROcodone-acetaminophen (NORCO) 10-325 MG per tablet Take 1 tablet by mouth every 4 (four) hours as needed. For pain.   Yes Historical Provider, MD  hydrocortisone cream (PREPARATION H HYDROCORTISONE) 1 % Apply 1 application topically 2 (two) times daily as needed. For hemmoroids.   Yes Historical Provider, MD  losartan (COZAAR) 50 MG tablet Take 50 mg by mouth daily.   Yes Historical Provider, MD  nitroGLYCERIN (NITROSTAT) 0.4 MG SL tablet Place 0.4 mg under the tongue every 5 (five) minutes as needed. For chest pains.   Yes Historical Provider, MD   oxybutynin (DITROPAN XL) 15 MG 24 hr tablet Take 15 mg by mouth daily.     Yes Historical Provider, MD  phenazopyridine (PYRIDIUM) 200 MG tablet Take 200 mg by mouth 2 (two) times daily.   Yes Historical Provider, MD  phenytoin (DILANTIN) 100 MG ER capsule Take 200 mg by mouth 2 (two) times daily.   Yes Historical Provider, MD  spironolactone (ALDACTONE) 25 MG tablet TAKE 1 TABLET EACH DAY 05/25/11  Yes Wendall Stade, MD  nitroGLYCERIN (NITROLINGUAL) 0.4 MG/SPRAY spray Place 1 spray under the tongue every 5 (five) minutes as needed. For chest pain 08/14/11   Wendall Stade, MD    Current Facility-Administered Medications  Medication Dose Route Frequency Provider Last Rate Last Dose  . 0.9 %  sodium chloride infusion   Intravenous Continuous Tarry Kos, MD 100 mL/hr at 03/07/12 0015    . carvedilol (COREG) tablet 12.5 mg  12.5 mg Oral Daily Tarry Kos, MD      . [address] ciprofloxacin (CIPRO) IVPB 400 mg  400 mg Intravenous Once Geoffery Lyons, MD   400 mg at 03/07/12 0047  . [COMPLETED] metroNIDAZOLE (FLAGYL) IVPB 500 mg  500 mg Intravenous Once Geoffery Lyons, MD   500 mg at 03/06/12 2309   Current Outpatient Prescriptions  Medication Sig Dispense Refill  . amoxicillin (AMOXIL) 500 MG capsule Take 500 mg by mouth 3 (three) times daily. Patient completed therapy on 03/05/12      . aspirin 325 MG tablet Take 325 mg by mouth daily.        Marland Kitchen atorvastatin (LIPITOR) 10 MG tablet Take 10 mg by mouth daily.      . carvedilol (COREG) 12.5 MG tablet Take 12.5 mg by mouth daily.       . chlorhexidine (PERIDEX) 0.12 % solution Use as directed 15 mLs in the mouth or throat 2 (two) times daily.      . cholecalciferol (VITAMIN D) 1000 UNITS tablet Take 1,000 Units by mouth daily.      . clopidogrel (PLAVIX) 75 MG tablet TAKE 1 TABLET EACH DAY TAKE WITH A   MEAL  30 tablet  1  . ferrous sulfate 325 (65 FE) MG tablet Take 325 mg by mouth 2 (two) times daily.       Marland Kitchen HYDROcodone-acetaminophen (NORCO)  10-325 MG per tablet Take 1 tablet by mouth every 4 (four)  hours as needed. For pain.      . hydrocortisone cream (PREPARATION H HYDROCORTISONE) 1 % Apply 1 application topically 2 (two) times daily as needed. For hemmoroids.      Marland Kitchen losartan (COZAAR) 50 MG tablet Take 50 mg by mouth daily.      . nitroGLYCERIN (NITROSTAT) 0.4 MG SL tablet Place 0.4 mg under the tongue every 5 (five) minutes as needed. For chest pains.      Marland Kitchen oxybutynin (DITROPAN XL) 15 MG 24 hr tablet Take 15 mg by mouth daily.        . phenazopyridine (PYRIDIUM) 200 MG tablet Take 200 mg by mouth 2 (two) times daily.      . phenytoin (DILANTIN) 100 MG ER capsule Take 200 mg by mouth 2 (two) times daily.      Marland Kitchen spironolactone (ALDACTONE) 25 MG tablet TAKE 1 TABLET EACH DAY  30 tablet  1  . nitroGLYCERIN (NITROLINGUAL) 0.4 MG/SPRAY spray Place 1 spray under the tongue every 5 (five) minutes as needed. For chest pain  12 g  11    Allergies as of 03/06/2012 - Review Complete 03/06/2012  Allergen Reaction Noted  . Red dye Other (See Comments) 03/06/2012  . Iodinated diagnostic agents Rash 09/26/2010    Family History  Problem Relation Age of Onset  . Colon cancer Neg Hx     History   Social History  . Marital Status: Widowed    Spouse Name: N/A    Number of Children: 0  . Years of Education: N/A   Occupational History  . RETIRED    Social History Main Topics  . Smoking status: Former Smoker -- 1.0 packs/day for 50 years    Types: Cigarettes    Quit date: 08/22/2004  . Smokeless tobacco: Never Used  . Alcohol Use: Yes     Comment: "quit drinking 1980's"  . Drug Use: No  . Sexually Active: No   Other Topics Concern  . Not on file   Social History Narrative  . No narrative on file    Review of Systems: Ten point ROS is O/W negative except as mentioned in HPI.  Physical Exam: Vital signs in last 24 hours: Temp:  [98.4 F (36.9 C)] 98.4 F (36.9 C) (12/05 0615) Pulse Rate:  [81-89] 86  (12/05  0615) Resp:  [11-24] 23  (12/05 0615) BP: (111-144)/(60-90) 133/68 mmHg (12/05 0615) SpO2:  [88 %-99 %] 97 % (12/05 0615)   General:  Alert, well-developed, well-nourished, pleasant and cooperative in NAD. Head:  Normocephalic and atraumatic. Eyes:  Sclera clear, no icterus.  Conjunctiva pink. Ears:  Normal auditory acuity. Mouth:  No deformity or lesions. Lungs:  Clear throughout to auscultation.  No wheezes, crackles, or rhonchi.  Heart:  Regular rate and rhythm; no murmurs, clicks, rubs,  or gallops. Abdomen:  Soft, nontender, BS active, nonpalp mass or hsm.   Rectal:  Deferred.  Msk:  Symmetrical without gross deformities. Pulses:  Normal pulses noted. Extremities:  Without clubbing or edema. Neurologic:  Alert and  oriented x4;  grossly normal neurologically. Skin:  Intact without significant lesions or rashes. Psych:  Alert and cooperative. Normal mood and affect.  Intake/Output from previous day: 12/04 0701 - 12/05 0700 In: -  Out: 800 [Urine:800]  Lab Results:  [facility] 03/07/12 0547 03/07/12 0210 03/06/12 2045  WBC -- -- 13.3*  HGB 8.0* 8.5* 10.2*  HCT 23.9* 25.1* 31.2*  PLT -- -- 277   BMET  Basename 03/07/12 0547 03/06/12  2045  NA 138 138  K 4.0 4.2  CL 107 104  CO2 25 25  GLUCOSE 87 116*  BUN 17 21  CREATININE 0.86 1.02  CALCIUM 8.5 9.1   LFT  Basename 03/06/12 2045  PROT 6.8  ALBUMIN 3.6  AST 18  ALT 14  ALKPHOS 70  BILITOT 0.3  BILIDIR --  IBILI --   PT/INR  Basename 03/07/12 0547  LABPROT 15.0  INR 1.20   IMPRESSION:  -LGIB:  Likely diverticular.  Has stopped so far at this point. -Acute on chronic anemia:  Acutely secondary to blood loss and IVF dilution.  PLAN: -Monitor Hgb and transfuse if needed. -Will likely observe for now since he just had a colonoscopy one year ago.  Will give clear liquids.  ZEHR, JESSICA D.  03/07/2012, 9:11 AM  Pager number [phone]      [facility] Attending  I have also seen and assessed the  patient and agree with the above note. We think he is having a diverticular bleed (as he must have done in 2012). Keep off ASA and Plavix Would address long-term need for Plavix with PCP or cardiologist before restarting.  Iva Boop, MD, [facility] [phone] (pager) 03/07/2012 8:21 PM

## 2012-03-07 NOTE — Progress Notes (Signed)
Report called to Aram Beecham, Charity fundraiser. Pt transferred @ 22:20pm to room 1402 with RN and NT Shawn. Pt vital signs WNL, and no c/o pain or SOB at this time. Pt very pleasant and cooperative. Chart and two bags of pt belongings taken with pt during transfer to the fourth floor.

## 2012-03-08 LAB — PROTIME-INR
INR: 1.2 (ref 0.00–1.49)
Prothrombin Time: 15 seconds (ref 11.6–15.2)

## 2012-03-08 LAB — CBC
HCT: 22.2 % — ABNORMAL LOW (ref 39.0–52.0)
Hemoglobin: 7.4 g/dL — ABNORMAL LOW (ref 13.0–17.0)
MCH: 30.7 pg (ref 26.0–34.0)
MCHC: 33.3 g/dL (ref 30.0–36.0)
RDW: 12.4 % (ref 11.5–15.5)

## 2012-03-08 LAB — BASIC METABOLIC PANEL
Calcium: 8.6 mg/dL (ref 8.4–10.5)
Chloride: 107 mEq/L (ref 96–112)
Creatinine, Ser: 0.86 mg/dL (ref 0.50–1.35)
GFR calc Af Amer: >90 mL/min (ref >90)
Sodium: 138 mEq/L (ref 135–145)

## 2012-03-08 LAB — URINE CULTURE

## 2012-03-08 MED ORDER — HYDROCODONE-ACETAMINOPHEN 10-325 MG PO TABS: 1.0000 | ORAL_TABLET | ORAL | Status: DC | PRN

## 2012-03-08 NOTE — Progress Notes (Signed)
[facility] Progress Note  Subjective:  Feels well.  Had a BM just a few minutes ago without any blood.  No abdominal pain.  Hgb down slightly this AM.  Objective:  Vital signs in last 24 hours: Temp:  [98.4 F (36.9 C)-99 F (37.2 C)] 98.5 F (36.9 C) (12/06 0532) Pulse Rate:  [67-78] 75  (12/06 0532) Resp:  [16-20] 18  (12/06 0532) BP: (107-155)/(60-75) 118/70 mmHg (12/06 0532) SpO2:  [95 %-99 %] 97 % (12/06 0532) Weight:  [132 lb 7.9 oz (60.1 kg)-134 lb 14.7 oz (61.2 kg)] 134 lb 14.7 oz (61.2 kg) (12/05 2238) Last BM Date: 03/07/12 General:   Alert, Well-developed, in NAD Heart:  Regular rate and rhythm; no murmurs Pulm:  CTAB.  No W/R/R. Abdomen:  Soft, nontender and nondistended. Normal bowel sounds, without guarding, and without rebound.   Extremities:  Without edema. Neurologic:  Alert and  oriented x4;  grossly normal neurologically. Psych:  Alert and cooperative. Normal mood and affect.  Intake/Output from previous day: 12/05 0701 - 12/06 0700 In: 1435 [P.O.:685; I.V.:750] Out: 1975 [Urine:1975]  Lab Results:  Basename 03/08/12 0441 03/07/12 1205 03/07/12 0547 03/06/12 2045  WBC 6.3 -- -- 13.3*  HGB 7.4* 8.0* 8.0* --  HCT 22.2* 24.0* 23.9* --  PLT 189 -- -- 277   BMET  Basename 03/08/12 0025 03/07/12 0547 03/06/12 2045  NA 138 138 138  K 3.6 4.0 4.2  CL 107 107 104  CO2 25 25 25   GLUCOSE 81 87 116*  BUN 14 17 21   CREATININE 0.86 0.86 1.02  CALCIUM 8.6 8.5 9.1   LFT  Basename 03/06/12 2045  PROT 6.8  ALBUMIN 3.6  AST 18  ALT 14  ALKPHOS 70  BILITOT 0.3  BILIDIR --  IBILI --   PT/INR  Basename 03/08/12 0025 03/07/12 0547  LABPROT 15.0 15.0  INR 1.20 1.20   Assessment / Plan: -LGIB: Likely diverticular.  No further bleeding in over 24 hours.  -Acute on chronic anemia: Acutely secondary to blood loss and IVF dilution.  Hgb down a bit this AM ? Equilibrating.   *Monitor Hgb and transfuse if needed.  *Continue to observe; just  had a colonoscopy one year ago. *Diet advanced to low fiber/low residue.    LOS: 2 days   [name].  03/08/2012, 8:54 AM  Pager number [phone]

## 2012-03-08 NOTE — Discharge Summary (Signed)
Physician Discharge Summary  [name] ZOX:[id number] DOB: 20-Nov-1932 DOA: 03/06/2012  PCP: Hoyle Sauer, MD  Admit date: 03/06/2012 Discharge date: 03/08/2012  Time spent: 30 minutes  Recommendations for Outpatient Follow-up:  1. Patient is being discharged back to assisted living facility 2. He'll follow up with his PCP in the next 2 weeks. 3. He'll stop Plavix for one week-had diverticular bleed  Discharge Diagnoses:  Principal Problem:  *Lower GI bleed = suspect diverticular Active Problems:  Hypertension  Seizure disorder  Anemia  CAD (coronary artery disease)  UTI (lower urinary tract infection)   Discharge Condition: Improved, being discharged home  Diet recommendation: Heart healthy  Filed Weights   03/07/12 1013 03/07/12 2238  Weight: 60.1 kg (132 lb 7.9 oz) 61.2 kg (134 lb 14.7 oz)    History of present illness:  78 year old African American male past medical history of hypertension and CAD who presented from his assisted living facility with sudden onset of bright red blood per. He had over 5 loose and bloody stools. No abdominal pain, nausea vomiting or fever. He previously had an episode of diverticular bleeding one year ago. In the emergency room he was noted to have a hemoglobin of 10.2. Patient was admitted to the hospital service and observed.  Hospital Course:  Principal Problem:  *Lower GI bleed = suspect diverticular: Repeat labs rechecked a few hours later with hydration and patient's hemoglobin had come down to 8.5. Labs 10 hours later noted a hemoglobin which was staying around 8. GI was consulted to evaluate the patient and recommended monitoring. The patient's bleeding had stopped and by the following morning on 12/6, patient's hemoglobin was steady at 7.4 stable blood pressure and heart rate. At this point patient not had any bloody bowel movement x24 hours. A repeat hemoglobin was done at 12 PM-8 hours from previous and actually improved up to  7.6. Patient suffers feeling better tolerating by mouth and felt to be stable for discharge back to assisted living facility. GI advised for him to resume his aspirin but hold his Plavix for one more week.  Active Problems:  Hypertension: Patient's blood pressure medications were held initially given concerns for GI bleed. His pressures improved and ranging from the 110s to 150s during his hospitalization. He felt to be safe to resume his home meds.   Seizure disorder: The patient's Dilantin was initially held. Once he stopped having bloody bowel movements, his by mouth Dilantin was resumed.   Anemia: Secondary acute blood loss anemia. Stable.   CAD (coronary artery disease): Stable medical issues during this hospitalization   UTI (lower urinary tract infection): Patient is noted to have large white cells and many bacteria, however morphology noted less than 50,000 colonies. A few yeast were noted. Patient received Cipro and Flagyl he was here for 3 days. He also completed a course of amoxicillin prior to coming in. He sees his primary care physician in a few weeks, urinalysis may be rechecked   Procedures:  None  Consultations:  [facility]  Discharge Exam: Filed Vitals:   03/07/12 2000 03/07/12 2238 03/08/12 0532 03/08/12 0939  BP: 116/65 120/60 118/70 116/62  Pulse: 76 76 75 90  Temp: 99 F (37.2 C) 98.4 F (36.9 C) 98.5 F (36.9 C)   TempSrc: Oral Oral Oral   Resp: 18 18 18    Height:  5\' 11"  (1.803 m)    Weight:  61.2 kg (134 lb 14.7 oz)    SpO2: 95% 96% 97%  General: Alert and oriented x3, no acute distress Cardiovascular: Regular rate and rhythm, S1-S2 Respiratory: Clear to auscultation bilaterally Abdomen: Soft, nontender, nondistended, positive bowel sounds : Extremities: No clubbing or cyanosis or edema  Discharge Instructions  Discharge Orders    Future Orders Please Complete By Expires   Diet - low sodium heart healthy      Increase activity slowly           Medication List     As of 03/08/2012  1:29 PM    STOP taking these medications         amoxicillin 500 MG capsule   Commonly known as: AMOXIL      clopidogrel 75 MG tablet   Commonly known as: PLAVIX**    Stop Plavix for one week and then resume     TAKE these medications         aspirin 325 MG tablet   Take 325 mg by mouth daily.      atorvastatin 10 MG tablet   Commonly known as: LIPITOR   Take 10 mg by mouth daily.      carvedilol 12.5 MG tablet   Commonly known as: COREG   Take 12.5 mg by mouth daily.      chlorhexidine 0.12 % solution   Commonly known as: PERIDEX   Use as directed 15 mLs in the mouth or throat 2 (two) times daily.      cholecalciferol 1000 UNITS tablet   Commonly known as: VITAMIN D   Take 1,000 Units by mouth daily.      ferrous sulfate 325 (65 FE) MG tablet   Take 325 mg by mouth 2 (two) times daily.      HYDROcodone-acetaminophen 10-325 MG per tablet   Commonly known as: NORCO   Take 1 tablet by mouth every 4 (four) hours as needed. For pain.      losartan 50 MG tablet   Commonly known as: COZAAR   Take 50 mg by mouth daily.      nitroGLYCERIN 0.4 MG SL tablet   Commonly known as: NITROSTAT   Place 0.4 mg under the tongue every 5 (five) minutes as needed. For chest pains.      nitroGLYCERIN 0.4 MG/SPRAY spray   Commonly known as: NITROLINGUAL   Place 1 spray under the tongue every 5 (five) minutes as needed. For chest pain      oxybutynin 15 MG 24 hr tablet   Commonly known as: DITROPAN XL   Take 15 mg by mouth daily.      phenazopyridine 200 MG tablet   Commonly known as: PYRIDIUM   Take 200 mg by mouth 2 (two) times daily.      phenytoin 100 MG ER capsule   Commonly known as: DILANTIN   Take 200 mg by mouth 2 (two) times daily.      PREPARATION H HYDROCORTISONE 1 %   Generic drug: hydrocortisone cream   Apply 1 application topically 2 (two) times daily as needed. For hemmoroids.      spironolactone 25 MG tablet    Commonly known as: ALDACTONE   TAKE 1 TABLET EACH DAY           Follow-up Information    Follow up with Hoyle Sauer, MD. Schedule an appointment as soon as possible for a visit in 2 weeks.   Contact information:   [address] [facility] [place] [address] [phone]  The results of significant diagnostics from this hospitalization (including imaging, microbiology, ancillary and laboratory) are listed below for reference.    Significant Diagnostic Studies: No results found.  Microbiology: Recent Results (from the past 240 hour(s))  URINE CULTURE     Status: Normal   Collection Time   03/07/12  1:51 AM      Component Value Range Status Comment   Specimen Description URINE, CATHETERIZED   Final    Special Requests NONE   Final    Culture  Setup Time 03/07/2012 09:24   Final    Colony Count 30,000 COLONIES/ML   Final    Culture     Final    Value: Multiple bacterial morphotypes present, none predominant. Suggest appropriate recollection if clinically indicated.   Report Status 03/08/2012 FINAL   Final   MRSA PCR SCREENING     Status: Normal   Collection Time   03/07/12 12:02 PM      Component Value Range Status Comment   MRSA by PCR NEGATIVE  NEGATIVE Final      Labs: Basic Metabolic Panel:  Lab 03/08/12 7846 03/07/12 0547 03/06/12 2045  NA 138 138 138  K 3.6 4.0 4.2  CL 107 107 104  CO2 25 25 25   GLUCOSE 81 87 116*  BUN 14 17 21   CREATININE 0.86 0.86 1.02  CALCIUM 8.6 8.5 9.1  MG -- -- --  PHOS -- -- --   Liver Function Tests:  Lab 03/06/12 2045  AST 18  ALT 14  ALKPHOS 70  BILITOT 0.3  PROT 6.8  ALBUMIN 3.6   6CBC:  Lab 03/08/12 1216 03/08/12 0441 03/07/12 1205 03/07/12 0547 03/07/12 0210 03/06/12 2045  WBC -- 6.3 -- -- -- 13.3*  NEUTROABS -- -- -- -- -- 10.3*  HGB 7.6* 7.4* 8.0* 8.0* 8.5* --  HCT 22.8* 22.2* 24.0* 23.9* 25.1* --  MCV -- 92.1 -- -- -- 91.5  PLT -- 189 -- -- -- 277       Signed:  Rozlynn Lippold K  [name] 03/08/2012, 1:29 PM

## 2012-03-08 NOTE — Progress Notes (Signed)
Patient cleared for discharge. Packet copied and placed in [facility]. ptar called for transportation.  Apurva Reily C. Jace Fermin MSW, LCSW [phone]  

## 2012-03-08 NOTE — Progress Notes (Signed)
Patient discharged back to assisted living via ambulance. Alert and oriented, denies any pain/distress. Skin intact no wound. Discharge packet prepared by CSW and given to EMS transporter.

## 2012-03-08 NOTE — Progress Notes (Signed)
Agree with Ms. Nicolasa Ducking note. Patient has improved. Ready for dc today.  Iva Boop, MD, [facility]

## 2012-03-08 NOTE — Care Management Note (Unsigned)
    Page 1 of 1   03/08/2012     11:47:19 AM   CARE MANAGEMENT NOTE 03/08/2012  Patient:  [name], [name]   Account Number:  [id number]  Date Initiated:  03/08/2012  Documentation initiated by:  Lanier Clam  Subjective/Objective Assessment:   ADMITTED W/BLOODY STOOLS.     Action/Plan:   FROM [facility].   Anticipated DC Date:  03/11/2012   Anticipated DC Plan:  ASSISTED LIVING / REST HOME      DC Planning Services  CM consult      Choice offered to / List presented to:             Status of service:  In process, will continue to follow Medicare Important Message given?   (If response is "NO", the following Medicare IM given date fields will be blank) Date Medicare IM given:   Date Additional Medicare IM given:    Discharge Disposition:    Per UR Regulation:  Reviewed for med. necessity/level of care/duration of stay  If discussed at Long Length of Stay Meetings, dates discussed:    Comments:  03/08/12 [facility] RN,BSN [phone]

## 2012-03-08 NOTE — Clinical Social Work Psychosocial (Unsigned)
     Clinical Social Work Department BRIEF PSYCHOSOCIAL ASSESSMENT 03/08/2012  Patient:  [name], [name]     Account Number:  [id number]     Admit date:  03/06/2012  Clinical Social Worker:  Hattie Perch  Date/Time:  03/08/2012 12:00 M  Referred by:  Physician  Date Referred:  03/08/2012 Referred for  ALF Placement   Other Referral:   Interview type:  Patient Other interview type:   family    PSYCHOSOCIAL DATA Living Status:  FACILITY Admitted from facility:  [facility] Level of care:  Assisted Living Primary support name:  [facility] Life Primary support relationship to patient:  FAMILY Degree of support available:   good    CURRENT CONCERNS Current Concerns  Post-Acute Placement   Other Concerns:    SOCIAL WORK ASSESSMENT / PLAN CSW met with patient and patient's family at bedside. They confirm that patient is a resident at [facility] and will return upon discharge.   Assessment/plan status:   Other assessment/ plan:   Information/referral to community resources:    PATIENTS/FAMILYS RESPONSE TO PLAN OF CARE: Agreeable to return to [facility] upon medical clearance.

## 2013-08-23 ENCOUNTER — Emergency Department (HOSPITAL_COMMUNITY)
Admission: EM | Admit: 2013-08-23 | Discharge: 2013-08-23 | Disposition: A | Discharge status: Skilled Nursing Facility | Payer: Medicare Other | Attending: Emergency Medicine | Admitting: Emergency Medicine

## 2013-08-23 ENCOUNTER — Emergency Department (HOSPITAL_COMMUNITY): Payer: Medicare Other

## 2013-08-23 ENCOUNTER — Encounter (HOSPITAL_COMMUNITY): Payer: Self-pay | Admitting: Emergency Medicine

## 2013-08-23 DIAGNOSIS — J449 Chronic obstructive pulmonary disease, unspecified: Secondary | ICD-10-CM | POA: Diagnosis not present

## 2013-08-23 DIAGNOSIS — Z7982 Long term (current) use of aspirin: Secondary | ICD-10-CM | POA: Diagnosis not present

## 2013-08-23 DIAGNOSIS — Z9889 Other specified postprocedural states: Secondary | ICD-10-CM | POA: Insufficient documentation

## 2013-08-23 DIAGNOSIS — I209 Angina pectoris, unspecified: Secondary | ICD-10-CM | POA: Insufficient documentation

## 2013-08-23 DIAGNOSIS — Z9861 Coronary angioplasty status: Secondary | ICD-10-CM | POA: Insufficient documentation

## 2013-08-23 DIAGNOSIS — Z87448 Personal history of other diseases of urinary system: Secondary | ICD-10-CM | POA: Insufficient documentation

## 2013-08-23 DIAGNOSIS — Z87891 Personal history of nicotine dependence: Secondary | ICD-10-CM | POA: Diagnosis not present

## 2013-08-23 DIAGNOSIS — I252 Old myocardial infarction: Secondary | ICD-10-CM | POA: Insufficient documentation

## 2013-08-23 DIAGNOSIS — G40909 Epilepsy, unspecified, not intractable, without status epilepticus: Secondary | ICD-10-CM | POA: Insufficient documentation

## 2013-08-23 DIAGNOSIS — I2510 Atherosclerotic heart disease of native coronary artery without angina pectoris: Secondary | ICD-10-CM | POA: Insufficient documentation

## 2013-08-23 DIAGNOSIS — K4090 Unilateral inguinal hernia, without obstruction or gangrene, not specified as recurrent: Secondary | ICD-10-CM | POA: Diagnosis not present

## 2013-08-23 DIAGNOSIS — Z862 Personal history of diseases of the blood and blood-forming organs and certain disorders involving the immune mechanism: Secondary | ICD-10-CM | POA: Diagnosis not present

## 2013-08-23 DIAGNOSIS — F0390 Unspecified dementia without behavioral disturbance: Secondary | ICD-10-CM | POA: Insufficient documentation

## 2013-08-23 DIAGNOSIS — Z79899 Other long term (current) drug therapy: Secondary | ICD-10-CM | POA: Diagnosis not present

## 2013-08-23 DIAGNOSIS — E7800 Pure hypercholesterolemia, unspecified: Secondary | ICD-10-CM | POA: Insufficient documentation

## 2013-08-23 DIAGNOSIS — I10 Essential (primary) hypertension: Secondary | ICD-10-CM | POA: Diagnosis not present

## 2013-08-23 DIAGNOSIS — J4489 Other specified chronic obstructive pulmonary disease: Secondary | ICD-10-CM | POA: Insufficient documentation

## 2013-08-23 DIAGNOSIS — R109 Unspecified abdominal pain: Secondary | ICD-10-CM | POA: Diagnosis present

## 2013-08-23 LAB — URINALYSIS, ROUTINE W REFLEX MICROSCOPIC
Bilirubin Urine: NEGATIVE
GLUCOSE, UA: NEGATIVE mg/dL
KETONES UR: NEGATIVE mg/dL
Nitrite: POSITIVE — AB
Protein, ur: NEGATIVE mg/dL
Specific Gravity, Urine: 1.011 (ref 1.005–1.030)
Urobilinogen, UA: 0.2 mg/dL (ref 0.0–1.0)
pH: 7 (ref 5.0–8.0)

## 2013-08-23 LAB — CBC WITH DIFFERENTIAL/PLATELET
BASOS ABS: 0.1 10*3/uL (ref 0.0–0.1)
Basophils Relative: 1 % (ref 0–1)
Eosinophils Absolute: 0.7 10*3/uL (ref 0.0–0.7)
Eosinophils Relative: 9 % — ABNORMAL HIGH (ref 0–5)
HEMATOCRIT: 37.7 % — AB (ref 39.0–52.0)
HEMOGLOBIN: 12.4 g/dL — AB (ref 13.0–17.0)
LYMPHS ABS: 1.2 10*3/uL (ref 0.7–4.0)
Lymphocytes Relative: 15 % (ref 12–46)
MCH: 29.8 pg (ref 26.0–34.0)
MCHC: 32.9 g/dL (ref 30.0–36.0)
MCV: 90.6 fL (ref 78.0–100.0)
MONOS PCT: 10 % (ref 3–12)
Monocytes Absolute: 0.8 10*3/uL (ref 0.1–1.0)
NEUTROS ABS: 5.2 10*3/uL (ref 1.7–7.7)
Neutrophils Relative %: 65 % (ref 43–77)
Platelets: 226 10*3/uL (ref 150–400)
RBC: 4.16 MIL/uL — AB (ref 4.22–5.81)
RDW: 12.5 % (ref 11.5–15.5)
WBC: 8 10*3/uL (ref 4.0–10.5)

## 2013-08-23 LAB — COMPREHENSIVE METABOLIC PANEL
ALT: 22 U/L (ref 0–53)
AST: 25 U/L (ref 0–37)
Albumin: 3.5 g/dL (ref 3.5–5.2)
Alkaline Phosphatase: 95 U/L (ref 39–117)
BUN: 19 mg/dL (ref 6–23)
CHLORIDE: 102 meq/L (ref 96–112)
CO2: 23 mEq/L (ref 19–32)
Calcium: 9.1 mg/dL (ref 8.4–10.5)
Creatinine, Ser: 0.88 mg/dL (ref 0.50–1.35)
GFR calc non Af Amer: 80 mL/min — ABNORMAL LOW (ref >90)
Glucose, Bld: 92 mg/dL (ref 70–99)
Potassium: 4.5 mEq/L (ref 3.7–5.3)
Sodium: 137 mEq/L (ref 137–147)
Total Bilirubin: <0.2 mg/dL — ABNORMAL LOW (ref 0.3–1.2)
Total Protein: 7 g/dL (ref 6.0–8.3)

## 2013-08-23 LAB — URINE MICROSCOPIC-ADD ON

## 2013-08-23 LAB — I-STAT CG4 LACTIC ACID, ED: Lactic Acid, Venous: 1.08 mmol/L (ref 0.5–2.2)

## 2013-08-23 MED ORDER — FENTANYL CITRATE 0.05 MG/ML IJ SOLN
50.0000 ug | Freq: Once | INTRAMUSCULAR | Status: AC
  Administered 2013-08-23: 50 ug via INTRAVENOUS
  Filled 2013-08-23: qty 2

## 2013-08-23 MED ORDER — IOHEXOL 300 MG/ML  SOLN
50.0000 mL | Freq: Once | INTRAMUSCULAR | Status: AC | PRN
  Administered 2013-08-23: 50 mL via ORAL

## 2013-08-23 NOTE — ED Notes (Signed)
Dammen, PA given CG4 Lactic results.

## 2013-08-23 NOTE — Discharge Instructions (Signed)
Please follow up with a general surgeon for continued evaluation and treatment of your hernia.  Return for any changing or worsening symptoms.    Inguinal Hernia, Adult Muscles help keep everything in the body in its proper place. But if a weak spot in the muscles develops, something can poke through. That is called a hernia. When this happens in the lower part of the belly (abdomen), it is called an inguinal hernia. (It takes its name from a part of the body in this region called the inguinal canal.) A weak spot in the wall of muscles lets some fat or part of the small intestine bulge through. An inguinal hernia can develop at any age. Men get them more often than women. CAUSES  In adults, an inguinal hernia develops over time.  It can be triggered by:  Suddenly straining the muscles of the lower abdomen.  Lifting heavy objects.  Straining to have a bowel movement. Difficult bowel movements (constipation) can lead to this.  Constant coughing. This may be caused by smoking or lung disease.  Being overweight.  Being pregnant.  Working at a job that requires long periods of standing or heavy lifting.  Having had an inguinal hernia before. One type can be an emergency situation. It is called a strangulated inguinal hernia. It develops if part of the small intestine slips through the weak spot and cannot get back into the abdomen. The blood supply can be cut off. If that happens, part of the intestine may die. This situation requires emergency surgery. SYMPTOMS  Often, a small inguinal hernia has no symptoms. It is found when a healthcare provider does a physical exam. Larger hernias usually have symptoms.   In adults, symptoms may include:  A lump in the groin. This is easier to see when the person is standing. It might disappear when lying down.  In men, a lump in the scrotum.  Pain or burning in the groin. This occurs especially when lifting, straining or coughing.  A dull ache  or feeling of pressure in the groin.  Signs of a strangulated hernia can include:  A bulge in the groin that becomes very painful and tender to the touch.  A bulge that turns red or purple.  Fever, nausea and vomiting.  Inability to have a bowel movement or to pass gas. DIAGNOSIS  To decide if you have an inguinal hernia, a healthcare provider will probably do a physical examination.  This will include asking questions about any symptoms you have noticed.  The healthcare provider might feel the groin area and ask you to cough. If an inguinal hernia is felt, the healthcare provider may try to slide it back into the abdomen.  Usually no other tests are needed. TREATMENT  Treatments can vary. The size of the hernia makes a difference. Options include:  Watchful waiting. This is often suggested if the hernia is small and you have had no symptoms.  No medical procedure will be done unless symptoms develop.  You will need to watch closely for symptoms. If any occur, contact your healthcare provider right away.  Surgery. This is used if the hernia is larger or you have symptoms.  Open surgery. This is usually an outpatient procedure (you will not stay overnight in a hospital). An cut (incision) is made through the skin in the groin. The hernia is put back inside the abdomen. The weak area in the muscles is then repaired by herniorrhaphy or hernioplasty. Herniorrhaphy: in this type of  surgery, the weak muscles are sewn back together. Hernioplasty: a patch or mesh is used to close the weak area in the abdominal wall.  Laparoscopy. In this procedure, a surgeon makes small incisions. A thin tube with a tiny video camera (called a laparoscope) is put into the abdomen. The surgeon repairs the hernia with mesh by looking with the video camera and using two long instruments. HOME CARE INSTRUCTIONS   After surgery to repair an inguinal hernia:  You will need to take pain medicine prescribed by  your healthcare provider. Follow all directions carefully.  You will need to take care of the wound from the incision.  Your activity will be restricted for awhile. This will probably include no heavy lifting for several weeks. You also should not do anything too active for a few weeks. When you can return to work will depend on the type of job that you have.  During "watchful waiting" periods, you should:  Maintain a healthy weight.  Eat a diet high in fiber (fruits, vegetables and whole grains).  Drink plenty of fluids to avoid constipation. This means drinking enough water and other liquids to keep your urine clear or pale yellow.  Do not lift heavy objects.  Do not stand for long periods of time.  Quit smoking. This should keep you from developing a frequent cough. SEEK MEDICAL CARE IF:   A bulge develops in your groin area.  You feel pain, a burning sensation or pressure in the groin. This might be worse if you are lifting or straining.  You develop a fever of more than 100.5 F (38.1 C). SEEK IMMEDIATE MEDICAL CARE IF:   Pain in the groin increases suddenly.  A bulge in the groin gets bigger suddenly and does not go down.  For men, there is sudden pain in the scrotum. Or, the size of the scrotum increases.  A bulge in the groin area becomes red or purple and is painful to touch.  You have nausea or vomiting that does not go away.  You feel your heart beating much faster than normal.  You cannot have a bowel movement or pass gas.  You develop a fever of more than 102.0 F (38.9 C). Document Released: 08/06/2008 Document Revised: 06/12/2011 Document Reviewed: 08/06/2008 [facility] Patient Information 2014 [place], [place].

## 2013-08-23 NOTE — ED Provider Notes (Signed)
CSN: [id number]     Arrival date & time 08/23/13  0122 History   First MD Initiated Contact with Patient 08/23/13 0142     Chief Complaint  Patient presents with  . pain on foley cath site    HPI  History provided by the patient and nursing facility. The patient is a 80 year old male with history of hypertension, CAD, COPD and mild dementia who presents with complaints of right groin pain. Patient reports having increased pain this evening. He reports a past history of hernia to the right side but is unable to say for how long. States pain to the area is new today. Nursing home reported that patient complained about his Foley catheter. He has had a Foley catheter following procedures since 2010. They're unsure when the last Foley catheter change to place. Patient has otherwise been well without nausea or vomiting. No abdominal distention. No fever, chills or sweats. Patient reports having a bowel movement prior to coming to the emergency room this evening. No other symptoms of diarrhea or constipation.    Past Medical History  Diagnosis Date  . Hypertension   . Vision impairment     "legally blind"  . Urethral stricture     with subsequent trauma and has an indwelling Foley catheter in place during his first MI                                                 . Anemia   . LV dysfunction     EF 25 to 30% per echo May 2012  . CAD (coronary artery disease)   . Prostatic hypertrophy   . COPD (chronic obstructive pulmonary disease)   . High cholesterol   . Acute MI Dec 2010    cardiac catheriziation and stenting of the LAD (bare metal) 03/14/09              . NSTEMI (non-ST elevated myocardial infarction) Nov 2011    with LV dysfunction  . Angina   . Seizures ~ 2006    "when blood pressure went up too high"; on Dilantin  . Blood transfusion   . Urinary catheter in place     chronic  . DEMENTIA    Past Surgical History  Procedure Laterality Date  . Tonsillectomy      "when I was  small"  . 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 Cardiac catheterization  02/2010  . Coronary angioplasty with stent placement  03/2009    "1"; stent to LAD  . Cholecystectomy  05/30/11  . Cholecystectomy  05/30/2011    Procedure: LAPAROSCOPIC CHOLECYSTECTOMY;  Surgeon: Judieth Keens, DO;  Location: [facility] OR;  Service: General;  Laterality: N/A;   Family History  Problem Relation Age of Onset  . Colon cancer Neg Hx    History  Substance Use Topics  . Smoking status: Former Smoker -- 1.00 packs/day for 50 years    Types: Cigarettes    Quit date: 08/22/2004  . Smokeless tobacco: Never Used  . Alcohol Use: Yes     Comment: "quit drinking 1980's"    Review of Systems  Constitutional: Negative for fever, chills and diaphoresis.  Gastrointestinal: Positive for abdominal pain. Negative for nausea, vomiting, diarrhea and constipation.  All other systems reviewed and are  negative.     Allergies  Red dye and Iodinated diagnostic agents  Home Medications   Prior to Admission medications   Medication Sig Start Date End Date Taking? Authorizing Provider  aspirin 325 MG tablet Take 325 mg by mouth daily.     Yes Historical Provider, MD  atorvastatin (LIPITOR) 10 MG tablet Take 10 mg by mouth daily.   Yes Historical Provider, MD  bismuth subsalicylate (PEPTO BISMOL) 262 MG/15ML suspension Take 30 mLs by mouth every 6 (six) hours as needed for diarrhea or loose stools.   Yes Historical Provider, MD  carvedilol (COREG) 12.5 MG tablet Take 12.5 mg by mouth daily.  05/12/11  Yes Rogelia Mire, NP  cholecalciferol (VITAMIN D) 1000 UNITS tablet Take 1,000 Units by mouth daily.   Yes Historical Provider, MD  donepezil (ARICEPT) 10 MG tablet Take 10 mg by mouth at bedtime.   Yes Historical Provider, MD  hydrocortisone cream (PREPARATION H HYDROCORTISONE) 1 % Apply 1 application topically 2 (two) times daily as needed. For hemmoroids.   Yes  Historical Provider, MD  losartan (COZAAR) 50 MG tablet Take 50 mg by mouth daily.   Yes Historical Provider, MD  Meth-Hyo-M Bl-Na Phos-Ph Sal (URIBEL) 118 MG CAPS Take 1 capsule by mouth 4 (four) times daily as needed (bladder symptoms).   Yes Historical Provider, MD  mirabegron ER (MYRBETRIQ) 25 MG TB24 tablet Take 50 mg by mouth daily.   Yes Historical Provider, MD  nitroGLYCERIN (NITROLINGUAL) 0.4 MG/SPRAY spray Place 1 spray under the tongue every 5 (five) minutes as needed for chest pain. For chest pain 08/14/11  Yes Josue Hector, MD  oxybutynin (DITROPAN XL) 15 MG 24 hr tablet Take 15 mg by mouth daily.     Yes Historical Provider, MD  phenazopyridine (PYRIDIUM) 95 MG tablet Take 190 mg by mouth every 6 (six) hours as needed for pain.   Yes Historical Provider, MD  phenytoin (DILANTIN) 100 MG ER capsule Take 200 mg by mouth 2 (two) times daily.   Yes Historical Provider, MD  spironolactone (ALDACTONE) 25 MG tablet Take 25 mg by mouth every morning.   Yes Historical Provider, MD   BP 152/84  Pulse 72  Temp(Src) 98.6 F (37 C) (Oral)  Resp 18  SpO2 92% Physical Exam  Nursing note and vitals reviewed. Constitutional: He appears well-developed and well-nourished.  HENT:  Head: Normocephalic.  Cardiovascular: Normal rate and regular rhythm.   Pulmonary/Chest: Effort normal and breath sounds normal. No respiratory distress. He has no wheezes. He has no rales.  Abdominal: Soft. He exhibits no distension. There is no tenderness. There is no rebound. A hernia is present. Hernia confirmed positive in the right inguinal area.  Genitourinary: Testes normal and penis normal.  Large right direct inguinal hernia that is very tender to palpation and tight. Foley catheter in place.  Neurological: He is alert.  Skin: Skin is warm. No erythema.  Psychiatric: He has a normal mood and affect. His behavior is normal.    ED Course  Procedures   COORDINATION OF CARE:  Nursing notes reviewed.  Vital signs reviewed. Initial pt interview and examination performed.   Filed Vitals:   08/23/13 0128  BP: 152/84  Pulse: 72  Temp: 98.6 F (37 C)  TempSrc: Oral  Resp: 18  SpO2: 92%    2:07 AM-patient seen and evaluated. Patient appears in some discomfort. He has a large right direct inguinal hernia it is tender to palpation. Patient has history of slight  dementia states this has been present for a long time and he sometimes soft and goes away but he has not say for how long. He reports that it only began hurting last night. Also reports having a bowel movement prior to arrival.  Pt also seen and evaluated by Attending physician.  Pt with large inguinal hernia.  Will get labs and CT to r/o incarcerated hernia.  4:00AM CT scan does not show signs of incarceration or SBO.  Will attempt to reduce hernia.  4:30AM Hernia was successfully reduced.  Pt without elevated WBC.  5:20AM Pt continues to be improved with no reoccurrence of hernia buldge. Stable for d/c at this time.  Will give gen surgery referral.   Treatment plan initiated: Medications  fentaNYL (SUBLIMAZE) injection 50 mcg (not administered)    Results for orders placed during the hospital encounter of 08/23/13  CBC WITH DIFFERENTIAL      Result Value Ref Range   WBC 8.0  4.0 - 10.5 K/uL   RBC 4.16 (*) 4.22 - 5.81 MIL/uL   Hemoglobin 12.4 (*) 13.0 - 17.0 g/dL   HCT 37.7 (*) 39.0 - 52.0 %   MCV 90.6  78.0 - 100.0 fL   MCH 29.8  26.0 - 34.0 pg   MCHC 32.9  30.0 - 36.0 g/dL   RDW 12.5  11.5 - 15.5 %   Platelets 226  150 - 400 K/uL   Neutrophils Relative % 65  43 - 77 %   Neutro Abs 5.2  1.7 - 7.7 K/uL   Lymphocytes Relative 15  12 - 46 %   Lymphs Abs 1.2  0.7 - 4.0 K/uL   Monocytes Relative 10  3 - 12 %   Monocytes Absolute 0.8  0.1 - 1.0 K/uL   Eosinophils Relative 9 (*) 0 - 5 %   Eosinophils Absolute 0.7  0.0 - 0.7 K/uL   Basophils Relative 1  0 - 1 %   Basophils Absolute 0.1  0.0 - 0.1 K/uL  COMPREHENSIVE  METABOLIC PANEL      Result Value Ref Range   Sodium 137  137 - 147 mEq/L   Potassium 4.5  3.7 - 5.3 mEq/L   Chloride 102  96 - 112 mEq/L   CO2 23  19 - 32 mEq/L   Glucose, Bld 92  70 - 99 mg/dL   BUN 19  6 - 23 mg/dL   Creatinine, Ser 0.88  0.50 - 1.35 mg/dL   Calcium 9.1  8.4 - 10.5 mg/dL   Total Protein 7.0  6.0 - 8.3 g/dL   Albumin 3.5  3.5 - 5.2 g/dL   AST 25  0 - 37 U/L   ALT 22  0 - 53 U/L   Alkaline Phosphatase 95  39 - 117 U/L   Total Bilirubin <0.2 (*) 0.3 - 1.2 mg/dL   GFR calc non Af Amer 80 (*) >90 mL/min   GFR calc Af Amer >90  >90 mL/min  URINALYSIS, ROUTINE W REFLEX MICROSCOPIC      Result Value Ref Range   Color, Urine ORANGE (*) YELLOW   APPearance CLEAR  CLEAR   Specific Gravity, Urine 1.011  1.005 - 1.030   pH 7.0  5.0 - 8.0   Glucose, UA NEGATIVE  NEGATIVE mg/dL   Hgb urine dipstick TRACE (*) NEGATIVE   Bilirubin Urine NEGATIVE  NEGATIVE   Ketones, ur NEGATIVE  NEGATIVE mg/dL   Protein, ur NEGATIVE  NEGATIVE mg/dL   Urobilinogen, UA 0.2  0.0 - 1.0 mg/dL   Nitrite POSITIVE (*) NEGATIVE   Leukocytes, UA SMALL (*) NEGATIVE  URINE MICROSCOPIC-ADD ON      Result Value Ref Range   Squamous Epithelial / LPF RARE  RARE   WBC, UA 3-6  <3 WBC/hpf   RBC / HPF 3-6  <3 RBC/hpf   Bacteria, UA FEW (*) RARE  I-STAT CG4 LACTIC ACID, ED      Result Value Ref Range   Lactic Acid, Venous 1.08  0.5 - 2.2 mmol/L     Imaging Review Ct Abdomen Pelvis Wo Contrast  08/23/2013   CLINICAL DATA:  Pain at site of Foley catheter. Abnormally colored urine.  EXAM: CT ABDOMEN AND PELVIS WITHOUT CONTRAST  TECHNIQUE: Multidetector CT imaging of the abdomen and pelvis was performed following the standard protocol without IV contrast.  COMPARISON:  CT of the abdomen and pelvis performed 10/27/2010, and abdominal ultrasound performed 04/22/2011  FINDINGS: Minimal bibasilar atelectasis is noted. Scattered coronary artery calcifications are seen.  The liver and spleen are unremarkable in  appearance. The patient is status post cholecystectomy, with clips noted in the gallbladder fossa. The pancreas and adrenal glands are unremarkable.  Scattered bilateral renal cysts are seen, measuring up to 4.7 cm in size. The kidneys are otherwise unremarkable in appearance. There is no evidence of hydronephrosis. No renal or ureteral stones are seen. Scattered vascular calcifications are noted at the renal hila. No perinephric stranding is appreciated.  Note is made of herniation of a segment of distal ileum into a moderate to large right inguinal hernia, with a small amount of associated free fluid, and slight distention of more proximal loops, likely reflecting some degree of small bowel dysmotility. There is no definite evidence of obstruction, though the more distal small bowel is relatively decompressed. The stomach is within normal limits. No acute vascular abnormalities are seen. Relatively diffuse calcification is seen along the abdominal aorta and its branches. The abdominal aorta is somewhat tortuous in nature.  The appendix is normal in caliber, without evidence for appendicitis. Scattered diverticulosis is noted along the ascending colon. Additional diverticulosis is noted along the descending and proximal sigmoid colon, without evidence of diverticulitis.  The bladder is decompressed, with a Foley catheter in place. Soft tissue inflammation about the bladder could reflect cystitis. The prostate is enlarged, measuring 5.5 cm in transverse dimension. No inguinal lymphadenopathy is seen. A small left-sided hydrocele is seen; there appears to be a small cystic focus within the left testis.  No acute osseous abnormalities are identified. Multilevel disc space narrowing is noted along the lumbar spine, with associated vacuum phenomenon.  IMPRESSION: 1. Soft tissue inflammation about the bladder could reflect cystitis. The bladder is decompressed, with a Foley catheter in place. The Foley catheter is  grossly unremarkable. 2. Herniation of a segment of distal ileum to a moderate to large right inguinal hernia, with associated free fluid, and slight distention of more proximal small bowel loops, likely reflecting some degree of small bowel dysmotility. No definite evidence of obstruction. 3. Scattered coronary artery calcifications seen. 4. Scattered bilateral renal cysts seen. 5. Relatively diffuse calcification along the abdominal aorta and its branches. 6. Scattered diverticulosis along the ascending, descending and proximal sigmoid colon. 7. Enlarged prostate noted. 8. Small left-sided hydrocele seen; apparent small cystic focus within the left testis.   Electronically Signed   By: Garald Balding M.D.   On: 08/23/2013 03:57     EKG Interpretation None      MDM  Final diagnoses:  Right inguinal hernia        Martie Lee, PA-C 08/23/13 0522

## 2013-08-23 NOTE — ED Notes (Signed)
Bed: RZ73 Expected date:  Expected time:  Means of arrival:  Comments: EMS 62M groin pain SNF

## 2013-08-23 NOTE — ED Provider Notes (Signed)
Medical screening examination/treatment/procedure(s) were conducted as a shared visit with non-physician practitioner(s) or resident and myself. I personally evaluated the patient during the encounter and agree with the findings and plan unless otherwise indicated.  I have personally reviewed any xrays and/ or EKG's with the provider and I agree with interpretation.  Patient presents with right groin pain starting sometime this evening and pain at Foley catheter site. Patient unsure exact time that started in on exam patient has large right lateral hernia for which she is unsure when the pain started however sometime this evening. Patient says in the past he is able to reduce it without difficulty and usually on its own however recently has been enlarged. Patient also says that he hardly ever reduces it so he is getting is some expiratory. On exam patient has mild tenderness and swelling to the right inguinal region, no significant discoloration, mild right lower abdominal tenderness proximal to the lateral hernia, well-appearing overall, mild dry mucous membranes. Plan for CT abdomen, labs and attempt to reduce hernia. CT scan no acute findings. Pain medicines given in ED.  3 exam patient's pain not improved. Repeat narcotic dose given. Patient's right inguinal hernia reduced after 2 attempts.  Procedure: Right inguinal hernia reduction.  Narcotic pain meds given prior and discussed hernia reduction attempt with patient.  Gradually increasing manual pressure directed towards right lower quadrant applied.  After 2 attempts right lateral hernia reduced and pain improved.  Discussed outpatient followup with general surgery.  Right inguinal hernia, or inguinal tenderness, Foley catheter  Labs Reviewed   CBC WITH DIFFERENTIAL - Abnormal; Notable for the following:    RBC  4.16 (*)     Hemoglobin  12.4 (*)     HCT  37.7 (*)     Eosinophils Relative  9 (*)     All other components within normal limits    COMPREHENSIVE METABOLIC PANEL - Abnormal; Notable for the following:    Total Bilirubin  <0.2 (*)     GFR calc non Af Amer  80 (*)     All other components within normal limits   URINALYSIS, ROUTINE W REFLEX MICROSCOPIC - Abnormal; Notable for the following:    Color, Urine  ORANGE (*)     Hgb urine dipstick  TRACE (*)     Nitrite  POSITIVE (*)     Leukocytes, UA  SMALL (*)     All other components within normal limits   URINE MICROSCOPIC-ADD ON - Abnormal; Notable for the following:    Bacteria, UA  FEW (*)     All other components within normal limits   I-STAT CG4 LACTIC ACID, ED    [facility]  Mariea Clonts, MD 08/23/13 [phone]

## 2013-08-23 NOTE — ED Notes (Signed)
Per EMS , pt. From [facility] with complaint of pain on foley cath site at 8/10 which started at 10pm last night, no bleeding but noted greenish blue colored urine. Pt. Has  F/C since 2010, but not sure when was the last time catheter was changed. Pt. Alert and oriented x3.

## 2013-08-23 NOTE — ED Notes (Signed)
Awaiting for PTAR. 

## 2013-08-26 ENCOUNTER — Telehealth (HOSPITAL_BASED_OUTPATIENT_CLINIC_OR_DEPARTMENT_OTHER): Payer: Self-pay

## 2013-08-26 LAB — URINE CULTURE: Colony Count: >=100000

## 2013-08-27 ENCOUNTER — Telehealth (HOSPITAL_BASED_OUTPATIENT_CLINIC_OR_DEPARTMENT_OTHER): Payer: Self-pay | Admitting: Emergency Medicine

## 2013-08-27 NOTE — Progress Notes (Signed)
ED Antimicrobial Stewardship Positive Culture Follow Up   [name] is an 80 y.o. male who presented to [facility] on 08/23/2013 with a chief complaint of  Chief Complaint  Patient presents with  . pain on foley cath site    . Groin Pain    Recent Results (from the past 720 hour(s))  URINE CULTURE     Status: None   Collection Time    08/23/13  4:24 AM      Result Value Ref Range Status   Specimen Description URINE, CATHETERIZED   Final   Special Requests NONE   Final   Culture  Setup Time     Final   Value: 08/23/2013 11:10     Performed at [facility]     Final   Value: >=100,000 COLONIES/ML     Performed at [facility]   Culture     Final   Value: METHICILLIN RESISTANT STAPHYLOCOCCUS AUREUS     Note: RIFAMPIN AND GENTAMICIN SHOULD NOT BE USED AS SINGLE DRUGS FOR TREATMENT OF STAPH INFECTIONS. CRITICAL RESULT CALLED TO, READ BACK BY AND VERIFIED WITH: TONY [email or web address]  ON [id number] BY [facility]     Performed at [facility]   Report Status 08/26/2013 FINAL   Final   Organism ID, Bacteria METHICILLIN RESISTANT STAPHYLOCOCCUS AUREUS   Final    [x]  Patient discharged originally without antimicrobial agent and treatment is now indicated  New antibiotic prescription: Bactrim DS 1 tab PO BID for 7 days  ED Provider: Alvina Chou, PA-C   [name] 08/27/2013, 10:44 AM Infectious Diseases Pharmacist Phone# [phone]

## 2013-08-27 NOTE — Telephone Encounter (Signed)
Post ED Visit - Positive Culture Follow-up: Successful Patient Follow-Up  Culture assessed and recommendations reviewed by: []  Wes Mahomet, Pharm.D., BCPS []  Heide Guile, Pharm.D., BCPS []  Alycia Rossetti, Pharm.D., BCPS []  [place], Pharm.D., BCPS, AAHIVP []  Legrand Como, Pharm.D., BCPS, AAHIVP [x]  Assunta Curtis, Pharm.D.  Positive urine culture  [x]  Patient discharged without antimicrobial prescription and treatment is now indicated []  Organism is resistant to prescribed ED discharge antimicrobial []  Patient with positive blood cultures  Changes discussed with ED provider: Alvina Chou PA-C New antibiotic prescription: Bactrim DS 1 tab PO BID x 7 days    [facility] 08/27/2013, 2:07 PM

## 2013-08-31 NOTE — Telephone Encounter (Signed)
[place] and spoke with RN taking care of patient. Faxed results to [phone].

## 2014-08-08 ENCOUNTER — Emergency Department (HOSPITAL_COMMUNITY)
Admission: EM | Admit: 2014-08-08 | Discharge: 2014-08-08 | Disposition: A | Discharge status: Home / Self Care | Payer: Medicare Other | Attending: Emergency Medicine | Admitting: Emergency Medicine

## 2014-08-08 ENCOUNTER — Encounter (HOSPITAL_COMMUNITY): Payer: Self-pay | Admitting: *Deleted

## 2014-08-08 DIAGNOSIS — Z7982 Long term (current) use of aspirin: Secondary | ICD-10-CM | POA: Diagnosis not present

## 2014-08-08 DIAGNOSIS — Z862 Personal history of diseases of the blood and blood-forming organs and certain disorders involving the immune mechanism: Secondary | ICD-10-CM | POA: Diagnosis not present

## 2014-08-08 DIAGNOSIS — F0390 Unspecified dementia without behavioral disturbance: Secondary | ICD-10-CM | POA: Insufficient documentation

## 2014-08-08 DIAGNOSIS — J449 Chronic obstructive pulmonary disease, unspecified: Secondary | ICD-10-CM | POA: Insufficient documentation

## 2014-08-08 DIAGNOSIS — E780 Pure hypercholesterolemia: Secondary | ICD-10-CM | POA: Diagnosis not present

## 2014-08-08 DIAGNOSIS — I25119 Atherosclerotic heart disease of native coronary artery with unspecified angina pectoris: Secondary | ICD-10-CM | POA: Diagnosis not present

## 2014-08-08 DIAGNOSIS — Z9861 Coronary angioplasty status: Secondary | ICD-10-CM | POA: Insufficient documentation

## 2014-08-08 DIAGNOSIS — R1030 Lower abdominal pain, unspecified: Secondary | ICD-10-CM | POA: Diagnosis present

## 2014-08-08 DIAGNOSIS — R34 Anuria and oliguria: Secondary | ICD-10-CM | POA: Insufficient documentation

## 2014-08-08 DIAGNOSIS — Z8669 Personal history of other diseases of the nervous system and sense organs: Secondary | ICD-10-CM | POA: Insufficient documentation

## 2014-08-08 DIAGNOSIS — Z9889 Other specified postprocedural states: Secondary | ICD-10-CM | POA: Diagnosis not present

## 2014-08-08 DIAGNOSIS — I252 Old myocardial infarction: Secondary | ICD-10-CM | POA: Diagnosis not present

## 2014-08-08 DIAGNOSIS — R339 Retention of urine, unspecified: Secondary | ICD-10-CM | POA: Insufficient documentation

## 2014-08-08 DIAGNOSIS — G40909 Epilepsy, unspecified, not intractable, without status epilepticus: Secondary | ICD-10-CM | POA: Diagnosis not present

## 2014-08-08 DIAGNOSIS — Z87448 Personal history of other diseases of urinary system: Secondary | ICD-10-CM | POA: Insufficient documentation

## 2014-08-08 DIAGNOSIS — Z79899 Other long term (current) drug therapy: Secondary | ICD-10-CM | POA: Diagnosis not present

## 2014-08-08 DIAGNOSIS — Z87891 Personal history of nicotine dependence: Secondary | ICD-10-CM | POA: Insufficient documentation

## 2014-08-08 DIAGNOSIS — I10 Essential (primary) hypertension: Secondary | ICD-10-CM | POA: Diagnosis not present

## 2014-08-08 LAB — URINALYSIS, ROUTINE W REFLEX MICROSCOPIC
Bilirubin Urine: NEGATIVE
Glucose, UA: NEGATIVE mg/dL
Ketones, ur: NEGATIVE mg/dL
Nitrite: NEGATIVE
PH: 6.5 (ref 5.0–8.0)
Protein, ur: NEGATIVE mg/dL
SPECIFIC GRAVITY, URINE: 1.01 (ref 1.005–1.030)
Urobilinogen, UA: 0.2 mg/dL (ref 0.0–1.0)

## 2014-08-08 LAB — URINE MICROSCOPIC-ADD ON

## 2014-08-08 MED ORDER — HYDROCODONE-ACETAMINOPHEN 5-325 MG PO TABS
1.0000 | ORAL_TABLET | Freq: Once | ORAL | Status: AC
  Administered 2014-08-08: 1 via ORAL
  Filled 2014-08-08: qty 1

## 2014-08-08 MED ORDER — PHENAZOPYRIDINE HCL 100 MG PO TABS
100.0000 mg | ORAL_TABLET | Freq: Once | ORAL | Status: AC
  Administered 2014-08-08: 100 mg via ORAL
  Filled 2014-08-08: qty 1

## 2014-08-08 NOTE — ED Provider Notes (Signed)
CSN: [id number]     Arrival date & time 08/08/14  1057 History   First MD Initiated Contact with Patient 08/08/14 1104     Chief Complaint  Patient presents with  . Foley Cath Pain      HPI  Presents evaluation of suprapubic pain. Has a Foley catheter. Has not had any urine output for the last several hours feels pain and pressure above his abdomen. No nausea vomiting. No bleeding or urinating around the catheter. No additional symptoms.  Past Medical History  Diagnosis Date  . Hypertension   . Vision impairment     "legally blind"  . Urethral stricture     with subsequent trauma and has an indwelling Foley catheter in place during his first MI                                                 . Anemia   . LV dysfunction     EF 25 to 30% per echo May 2012  . CAD (coronary artery disease)   . Prostatic hypertrophy   . COPD (chronic obstructive pulmonary disease)   . High cholesterol   . Acute MI Dec 2010    cardiac catheriziation and stenting of the LAD (bare metal) 03/14/09              . NSTEMI (non-ST elevated myocardial infarction) Nov 2011    with LV dysfunction  . Angina   . Seizures ~ 2006    "when blood pressure went up too high"; on Dilantin  . Blood transfusion   . Urinary catheter in place     chronic  . DEMENTIA    Past Surgical History  Procedure Laterality Date  . Tonsillectomy      "when I was small"  . 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 Cardiac catheterization  02/2010  . Coronary angioplasty with stent placement  03/2009    "1"; stent to LAD  . Cholecystectomy  05/30/11  . Cholecystectomy  05/30/2011    Procedure: LAPAROSCOPIC CHOLECYSTECTOMY;  Surgeon: Judieth Keens, DO;  Location: [facility] OR;  Service: General;  Laterality: N/A;   Family History  Problem Relation Age of Onset  . Colon cancer Neg Hx    History  Substance Use Topics  . Smoking status: Former Smoker -- 1.00 packs/day for 50 years   Types: Cigarettes    Quit date: 08/22/2004  . Smokeless tobacco: Never Used  . Alcohol Use: Yes     Comment: "quit drinking 1980's"    Review of Systems  Constitutional: Negative for fever, chills, diaphoresis, appetite change and fatigue.  HENT: Negative for mouth sores, sore throat and trouble swallowing.   Eyes: Negative for visual disturbance.  Respiratory: Negative for cough, chest tightness, shortness of breath and wheezing.   Cardiovascular: Negative for chest pain.  Gastrointestinal: Positive for abdominal pain and abdominal distention. Negative for nausea, vomiting and diarrhea.  Endocrine: Negative for polydipsia, polyphagia and polyuria.  Genitourinary: Positive for decreased urine volume. Negative for dysuria, frequency and hematuria.  Musculoskeletal: Negative for gait problem.  Skin: Negative for color change, pallor and rash.  Neurological: Negative for dizziness, syncope, light-headedness and headaches.  Hematological: Does not bruise/bleed easily.  Psychiatric/Behavioral: Negative for behavioral problems and confusion.  Allergies  Red dye and Iodinated diagnostic agents  Home Medications   Prior to Admission medications   Medication Sig Start Date End Date Taking? Authorizing Provider  aspirin 325 MG EC tablet Take 325 mg by mouth daily with breakfast.   Yes Historical Provider, MD  atorvastatin (LIPITOR) 10 MG tablet Take 10 mg by mouth daily.   Yes Historical Provider, MD  bismuth subsalicylate (PEPTO BISMOL) 262 MG/15ML suspension Take 30 mLs by mouth as needed for diarrhea or loose stools.    Yes Historical Provider, MD  carvedilol (COREG) 12.5 MG tablet Take 12.5 mg by mouth 2 (two) times daily with a meal.  05/12/11  Yes Rogelia Mire, NP  cholecalciferol (VITAMIN D) 1000 UNITS tablet Take 1,000 Units by mouth daily.   Yes Historical Provider, MD  donepezil (ARICEPT) 10 MG tablet Take 10 mg by mouth at bedtime.   Yes Historical Provider, MD    Fluticasone Furoate-Vilanterol 100-25 MCG/INH AEPB Inhale 1 puff into the lungs daily with breakfast.   Yes Historical Provider, MD  hydrocortisone cream (PREPARATION H HYDROCORTISONE) 1 % Apply 1 application topically 2 (two) times daily as needed. For hemmoroids.   Yes Historical Provider, MD  losartan (COZAAR) 50 MG tablet Take 50 mg by mouth daily.   Yes Historical Provider, MD  mirabegron ER (MYRBETRIQ) 50 MG TB24 tablet Take 50 mg by mouth daily with breakfast.   Yes Historical Provider, MD  nitroGLYCERIN (NITROLINGUAL) 0.4 MG/SPRAY spray Place 1 spray under the tongue every 5 (five) minutes as needed for chest pain. For chest pain 08/14/11  Yes Josue Hector, MD  nitroGLYCERIN (NITROSTAT) 0.3 MG SL tablet Place 0.3 mg under the tongue every 5 (five) minutes as needed for chest pain.   Yes Historical Provider, MD  oxybutynin (DITROPAN XL) 15 MG 24 hr tablet Take 15 mg by mouth daily.     Yes Historical Provider, MD  phenytoin (DILANTIN) 100 MG ER capsule Take 200 mg by mouth 2 (two) times daily.   Yes Historical Provider, MD  spironolactone (ALDACTONE) 25 MG tablet Take 25 mg by mouth every morning.   Yes Historical Provider, MD   BP 121/70 mmHg  Pulse 83  Temp(Src) 98.7 F (37.1 C) (Oral)  Resp 17  Ht 5\' 8"  (1.727 m)  Wt 158 lb (71.668 kg)  BMI 24.03 kg/m2  SpO2 94% Physical Exam  Constitutional: He is oriented to person, place, and time. He appears well-developed and well-nourished. No distress.  HENT:  Head: Normocephalic.  Eyes: Conjunctivae are normal. Pupils are equal, round, and reactive to light. No scleral icterus.  Neck: Normal range of motion. Neck supple. No thyromegaly present.  Cardiovascular: Normal rate and regular rhythm.  Exam reveals no gallop and no friction rub.   No murmur heard. Pulmonary/Chest: Effort normal and breath sounds normal. No respiratory distress. He has no wheezes. He has no rales.  Abdominal: Soft. Bowel sounds are normal. He exhibits no  distension. There is tenderness. There is no rebound.    Musculoskeletal: Normal range of motion.  Neurological: He is alert and oriented to person, place, and time.  Skin: Skin is warm and dry. No rash noted.  Psychiatric: He has a normal mood and affect. His behavior is normal.    ED Course  Procedures (including critical care time) Labs Review Labs Reviewed  URINALYSIS, ROUTINE W REFLEX MICROSCOPIC - Abnormal; Notable for the following:    Hgb urine dipstick LARGE (*)    Leukocytes, UA TRACE (*)  All other components within normal limits  URINE CULTURE  URINE MICROSCOPIC-ADD ON    Imaging Review No results found.   EKG Interpretation None      MDM   Final diagnoses:  Urinary retention    Foley changes.  Excellent relief.  Discharged home. Pyridium as needed. Primary care follow-up.    Tanna Furry, MD 08/08/14 [phone]

## 2014-08-08 NOTE — Discharge Instructions (Signed)
Acute Urinary Retention  Acute urinary retention is when you are unable to pee (urinate). Acute urinary retention is common in older men. Prostates can get bigger, which blocks the flow of pee.   HOME CARE  · Drink enough fluids to keep your pee clear or pale yellow.  · If you are sent home with a tube that drains the bladder (catheter), there will be a drainage bag attached to it. There are two types of bags. One is big that you can wear at night without having to empty it. One is smaller and needs to be emptied more often.  ¨ Keep the drainage bag empty.  ¨ Keep the drainage bag lower than your catheter.  · Only take medicine as told by your doctor.  GET HELP IF:  · You have a low-grade fever.  · You have spasms or you are leaking pee when you have spasms.  GET HELP RIGHT AWAY IF:   · You have chills or a fever.  · Your catheter stops draining pee.  · Your catheter falls out.  · You have increased bleeding that does not stop after you have rested and increased the amount of fluids you had been drinking.  MAKE SURE YOU:   · Understand these instructions.  · Will watch your condition.  · Will get help right away if you are not doing well or get worse.  Document Released: 09/06/2007 Document Revised: 01/08/2013 Document Reviewed: 08/29/2012  Exit[employer]® Patient Information ©2015 [employer]. This information is not intended to replace advice given to you by your health care provider. Make sure you discuss any questions you have with your health care provider.

## 2014-08-08 NOTE — ED Notes (Signed)
Bed: WA02 Expected date: 08/08/14 Expected time: 10:43 AM Means of arrival: Ambulance Comments: Foley cath problems

## 2014-08-08 NOTE — ED Notes (Signed)
EMS states pt had foley cath changed 1-2 weeks ago, usually changes every 3 weeks, different type placed last time, problems developed this am with pain in bladder and in meatus. Pt feels like there is a blockage.

## 2014-08-08 NOTE — ED Notes (Signed)
Bladder scan showed 121ml

## 2014-08-09 LAB — URINE CULTURE: Colony Count: 7000

## 2015-07-01 ENCOUNTER — Encounter (HOSPITAL_COMMUNITY): Payer: Self-pay

## 2015-07-01 ENCOUNTER — Inpatient Hospital Stay (HOSPITAL_COMMUNITY)
Admission: EM | Admit: 2015-07-01 | Discharge: 2015-07-04 | DRG: 394 | Disposition: A | Discharge status: Nursing Home (Includes ICF) | Payer: Medicare Other | Attending: Internal Medicine | Admitting: Internal Medicine

## 2015-07-01 DIAGNOSIS — Z66 Do not resuscitate: Secondary | ICD-10-CM | POA: Diagnosis present

## 2015-07-01 DIAGNOSIS — D62 Acute posthemorrhagic anemia: Secondary | ICD-10-CM | POA: Diagnosis present

## 2015-07-01 DIAGNOSIS — K625 Hemorrhage of anus and rectum: Secondary | ICD-10-CM | POA: Diagnosis not present

## 2015-07-01 DIAGNOSIS — I110 Hypertensive heart disease with heart failure: Secondary | ICD-10-CM | POA: Diagnosis present

## 2015-07-01 DIAGNOSIS — Z9102 Food additives allergy status: Secondary | ICD-10-CM

## 2015-07-01 DIAGNOSIS — I5022 Chronic systolic (congestive) heart failure: Secondary | ICD-10-CM | POA: Diagnosis present

## 2015-07-01 DIAGNOSIS — D649 Anemia, unspecified: Secondary | ICD-10-CM | POA: Diagnosis not present

## 2015-07-01 DIAGNOSIS — Z955 Presence of coronary angioplasty implant and graft: Secondary | ICD-10-CM

## 2015-07-01 DIAGNOSIS — I10 Essential (primary) hypertension: Secondary | ICD-10-CM

## 2015-07-01 DIAGNOSIS — G40909 Epilepsy, unspecified, not intractable, without status epilepticus: Secondary | ICD-10-CM

## 2015-07-01 DIAGNOSIS — F0390 Unspecified dementia without behavioral disturbance: Secondary | ICD-10-CM | POA: Diagnosis present

## 2015-07-01 DIAGNOSIS — Z7982 Long term (current) use of aspirin: Secondary | ICD-10-CM

## 2015-07-01 DIAGNOSIS — K649 Unspecified hemorrhoids: Principal | ICD-10-CM | POA: Diagnosis present

## 2015-07-01 DIAGNOSIS — H548 Legal blindness, as defined in USA: Secondary | ICD-10-CM | POA: Diagnosis present

## 2015-07-01 DIAGNOSIS — N400 Enlarged prostate without lower urinary tract symptoms: Secondary | ICD-10-CM | POA: Diagnosis present

## 2015-07-01 DIAGNOSIS — Z91041 Radiographic dye allergy status: Secondary | ICD-10-CM

## 2015-07-01 DIAGNOSIS — Z87891 Personal history of nicotine dependence: Secondary | ICD-10-CM

## 2015-07-01 DIAGNOSIS — Z79899 Other long term (current) drug therapy: Secondary | ICD-10-CM

## 2015-07-01 DIAGNOSIS — I2510 Atherosclerotic heart disease of native coronary artery without angina pectoris: Secondary | ICD-10-CM | POA: Diagnosis present

## 2015-07-01 DIAGNOSIS — E7800 Pure hypercholesterolemia, unspecified: Secondary | ICD-10-CM | POA: Diagnosis present

## 2015-07-01 DIAGNOSIS — J449 Chronic obstructive pulmonary disease, unspecified: Secondary | ICD-10-CM | POA: Diagnosis present

## 2015-07-01 DIAGNOSIS — I252 Old myocardial infarction: Secondary | ICD-10-CM

## 2015-07-01 DIAGNOSIS — D638 Anemia in other chronic diseases classified elsewhere: Secondary | ICD-10-CM | POA: Diagnosis present

## 2015-07-01 LAB — COMPREHENSIVE METABOLIC PANEL
ALBUMIN: 3.3 g/dL — AB (ref 3.5–5.0)
ALT: 15 U/L — ABNORMAL LOW (ref 17–63)
ANION GAP: 6 (ref 5–15)
AST: 20 U/L (ref 15–41)
Alkaline Phosphatase: 65 U/L (ref 38–126)
BILIRUBIN TOTAL: 0.6 mg/dL (ref 0.3–1.2)
BUN: 22 mg/dL — ABNORMAL HIGH (ref 6–20)
CO2: 20 mmol/L — AB (ref 22–32)
CREATININE: 1.12 mg/dL (ref 0.61–1.24)
Calcium: 8.2 mg/dL — ABNORMAL LOW (ref 8.9–10.3)
Chloride: 110 mmol/L (ref 101–111)
GFR calc Af Amer: >60 mL/min (ref >60)
GFR calc non Af Amer: >60 mL/min (ref >60)
Glucose, Bld: 104 mg/dL — ABNORMAL HIGH (ref 65–99)
Potassium: 3.8 mmol/L (ref 3.5–5.1)
Sodium: 136 mmol/L (ref 135–145)
TOTAL PROTEIN: 5.9 g/dL — AB (ref 6.5–8.1)

## 2015-07-01 LAB — PROTIME-INR
INR: 1.18 (ref 0.00–1.49)
INR: 1.23 (ref 0.00–1.49)
Prothrombin Time: 15.2 seconds (ref 11.6–15.2)
Prothrombin Time: 15.7 seconds — ABNORMAL HIGH (ref 11.6–15.2)

## 2015-07-01 LAB — CBC WITH DIFFERENTIAL/PLATELET
Basophils Absolute: 0.1 10*3/uL (ref 0.0–0.1)
Basophils Relative: 1 %
Eosinophils Absolute: 0.1 10*3/uL (ref 0.0–0.7)
Eosinophils Relative: 1 %
HEMATOCRIT: 26.6 % — AB (ref 39.0–52.0)
Hemoglobin: 8.9 g/dL — ABNORMAL LOW (ref 13.0–17.0)
LYMPHS ABS: 1.2 10*3/uL (ref 0.7–4.0)
LYMPHS PCT: 11 %
MCH: 30 pg (ref 26.0–34.0)
MCHC: 33.5 g/dL (ref 30.0–36.0)
MCV: 89.6 fL (ref 78.0–100.0)
MONO ABS: 1.1 10*3/uL — AB (ref 0.1–1.0)
MONOS PCT: 11 %
NEUTROS ABS: 8.4 10*3/uL — AB (ref 1.7–7.7)
NEUTROS PCT: 76 %
Platelets: 177 10*3/uL (ref 150–400)
RBC: 2.97 MIL/uL — ABNORMAL LOW (ref 4.22–5.81)
RDW: 13.3 % (ref 11.5–15.5)
WBC: 10.8 10*3/uL — ABNORMAL HIGH (ref 4.0–10.5)

## 2015-07-01 LAB — MRSA PCR SCREENING: MRSA BY PCR: NEGATIVE

## 2015-07-01 LAB — POC OCCULT BLOOD, ED: FECAL OCCULT BLD: POSITIVE — AB

## 2015-07-01 LAB — TSH: TSH: 0.61 u[IU]/mL (ref 0.350–4.500)

## 2015-07-01 MED ORDER — NITROGLYCERIN 0.4 MG/SPRAY TL SOLN: 1.0000 | Status: DC | PRN

## 2015-07-01 MED ORDER — HYDROCODONE-ACETAMINOPHEN 5-325 MG PO TABS: 1.0000 | ORAL_TABLET | ORAL | Status: DC | PRN

## 2015-07-01 MED ORDER — HYDROCORTISONE 2.5 % RE CREA
TOPICAL_CREAM | Freq: Two times a day (BID) | RECTAL | Status: DC
  Administered 2015-07-01: 1 via RECTAL
  Administered 2015-07-02 – 2015-07-03 (×3): via RECTAL
  Administered 2015-07-03: 1 via RECTAL
  Administered 2015-07-04: 08:00:00 via RECTAL
  Filled 2015-07-01 (×2): qty 28.35

## 2015-07-01 MED ORDER — SODIUM CHLORIDE 0.9% FLUSH
3.0000 mL | Freq: Two times a day (BID) | INTRAVENOUS | Status: DC
  Administered 2015-07-02 – 2015-07-04 (×5): 3 mL via INTRAVENOUS

## 2015-07-01 MED ORDER — SODIUM CHLORIDE 0.9 % IV SOLN
INTRAVENOUS | Status: AC
  Administered 2015-07-01: 19:00:00 via INTRAVENOUS

## 2015-07-01 MED ORDER — ACETAMINOPHEN 325 MG PO TABS: 650.0000 mg | ORAL_TABLET | Freq: Four times a day (QID) | ORAL | Status: DC | PRN

## 2015-07-01 MED ORDER — ONDANSETRON HCL 4 MG PO TABS: 4.0000 mg | ORAL_TABLET | Freq: Four times a day (QID) | ORAL | Status: DC | PRN

## 2015-07-01 MED ORDER — ONDANSETRON HCL 4 MG/2ML IJ SOLN: 4.0000 mg | Freq: Four times a day (QID) | INTRAMUSCULAR | Status: DC | PRN

## 2015-07-01 MED ORDER — ATORVASTATIN CALCIUM 10 MG PO TABS
10.0000 mg | ORAL_TABLET | Freq: Every day | ORAL | Status: DC
Start: 2015-07-02 — End: 2015-07-04
  Administered 2015-07-02 – 2015-07-04 (×3): 10 mg via ORAL
  Filled 2015-07-01 (×3): qty 1

## 2015-07-01 MED ORDER — OXYBUTYNIN CHLORIDE ER 15 MG PO TB24
15.0000 mg | ORAL_TABLET | Freq: Every day | ORAL | Status: DC
  Administered 2015-07-02 – 2015-07-04 (×3): 15 mg via ORAL
  Filled 2015-07-01 (×3): qty 1

## 2015-07-01 MED ORDER — PHENYTOIN SODIUM EXTENDED 100 MG PO CAPS
200.0000 mg | ORAL_CAPSULE | Freq: Two times a day (BID) | ORAL | Status: DC
  Administered 2015-07-01 – 2015-07-04 (×6): 200 mg via ORAL
  Filled 2015-07-01 (×8): qty 2

## 2015-07-01 MED ORDER — WITCH HAZEL-GLYCERIN EX PADS: MEDICATED_PAD | CUTANEOUS | Status: DC | PRN

## 2015-07-01 MED ORDER — LOSARTAN POTASSIUM 50 MG PO TABS
50.0000 mg | ORAL_TABLET | Freq: Every day | ORAL | Status: DC
  Administered 2015-07-02 – 2015-07-04 (×3): 50 mg via ORAL
  Filled 2015-07-01 (×3): qty 1

## 2015-07-01 MED ORDER — NITROGLYCERIN 0.4 MG SL SUBL
0.4000 mg | SUBLINGUAL_TABLET | SUBLINGUAL | Status: DC | PRN
Start: 2015-07-01 — End: 2015-07-04
  Administered 2015-07-04: 0.4 mg via SUBLINGUAL

## 2015-07-01 MED ORDER — MORPHINE SULFATE (PF) 2 MG/ML IV SOLN: 1.0000 mg | INTRAVENOUS | Status: DC | PRN

## 2015-07-01 MED ORDER — CARVEDILOL 12.5 MG PO TABS
12.5000 mg | ORAL_TABLET | Freq: Two times a day (BID) | ORAL | Status: DC
  Administered 2015-07-02 – 2015-07-04 (×5): 12.5 mg via ORAL
  Filled 2015-07-01 (×5): qty 1

## 2015-07-01 MED ORDER — FLUTICASONE FUROATE-VILANTEROL 100-25 MCG/INH IN AEPB
1.0000 | INHALATION_SPRAY | Freq: Every day | RESPIRATORY_TRACT | Status: DC
Start: 2015-07-02 — End: 2015-07-04
  Administered 2015-07-02 – 2015-07-04 (×3): 1 via RESPIRATORY_TRACT
  Filled 2015-07-01: qty 28

## 2015-07-01 MED ORDER — ACETAMINOPHEN 650 MG RE SUPP: 650.0000 mg | Freq: Four times a day (QID) | RECTAL | Status: DC | PRN

## 2015-07-01 MED ORDER — DONEPEZIL HCL 10 MG PO TABS
10.0000 mg | ORAL_TABLET | Freq: Every day | ORAL | Status: DC
  Administered 2015-07-01 – 2015-07-03 (×3): 10 mg via ORAL
  Filled 2015-07-01 (×3): qty 1

## 2015-07-01 MED ORDER — MIRABEGRON ER 50 MG PO TB24
50.0000 mg | ORAL_TABLET | Freq: Every day | ORAL | Status: DC
  Administered 2015-07-02 – 2015-07-04 (×3): 50 mg via ORAL
  Filled 2015-07-01 (×4): qty 1

## 2015-07-01 NOTE — Progress Notes (Addendum)
Received report at shift change that pt had just arrived shortly.Pt awake and alert and oriented x4.When asked about an advanced directive ,code status,pt states that his step-daughter is in charge of making this decision.Paged on-call.Pt refuses any vaccination.He states that he has never taken them.Pt educated on importance and risks and pt says that he has never taken them and does not wish to do so.Foley in place prior to admission.Pt reports long history of being unable to void and has required foley since.wbb

## 2015-07-01 NOTE — ED Provider Notes (Signed)
CSN: [id number]     Arrival date & time 07/01/15  1505 History   First MD Initiated Contact with Patient 07/01/15 1518     No chief complaint on file.   HPI   [name] is a 82 y.o. male with a PMH of HTN, HLD, CAD, MI, COPD who presents to the ED with diarrhea, which he states started early this morning. He reports his stool was darker than usual, and notes he has bright red blood from his rectum, which he attributes to a bleeding hemorrhoid. He denies exacerbating or alleviating factors. He denies fever, chills, abdominal pain, nausea, vomiting, constipation.   Past Medical History  Diagnosis Date  . Hypertension   . Vision impairment     "legally blind"  . Urethral stricture     with subsequent trauma and has an indwelling Foley catheter in place during his first MI                                                 . Anemia   . LV dysfunction     EF 25 to 30% per echo May 2012  . CAD (coronary artery disease)   . Prostatic hypertrophy   . COPD (chronic obstructive pulmonary disease) ([place])   . High cholesterol   . Acute MI [facility]) Dec 2010    cardiac catheriziation and stenting of the LAD (bare metal) 03/14/09              . NSTEMI (non-ST elevated myocardial infarction) [facility]) Nov 2011    with LV dysfunction  . Angina   . Seizures ([place]) ~ 2006    "when blood pressure went up too high"; on Dilantin  . Blood transfusion   . Urinary catheter in place     chronic  . DEMENTIA    Past Surgical History  Procedure Laterality Date  . Tonsillectomy      "when I was small"  . 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 Cardiac catheterization  02/2010  . Coronary angioplasty with stent placement  03/2009    "1"; stent to LAD  . Cholecystectomy  05/30/11  . Cholecystectomy  05/30/2011    Procedure: LAPAROSCOPIC CHOLECYSTECTOMY;  Surgeon: Judieth Keens, DO;  Location: [facility] OR;  Service: General;  Laterality: N/A;   Family History   Problem Relation Age of Onset  . Colon cancer Neg Hx    Social History  Substance Use Topics  . Smoking status: Former Smoker -- 0.00 packs/day for 50 years    Types: Cigarettes    Quit date: 08/22/2004  . Smokeless tobacco: Never Used  . Alcohol Use: No     Comment: "quit drinking 1980's"      Review of Systems  Constitutional: Negative for fever and chills.  Gastrointestinal: Positive for diarrhea and blood in stool. Negative for nausea, vomiting, abdominal pain and constipation.  All other systems reviewed and are negative.     Allergies  Red dye and Iodinated diagnostic agents  Home Medications   Prior to Admission medications   Medication Sig Start Date End Date Taking? Authorizing Provider  aspirin 325 MG EC tablet Take 325 mg by mouth daily with breakfast.   Yes Historical Provider, MD  atorvastatin (LIPITOR) 10 MG tablet Take 10 mg  by mouth daily.   Yes Historical Provider, MD  bismuth subsalicylate (PEPTO BISMOL) 262 MG/15ML suspension Take 30 mLs by mouth as needed for diarrhea or loose stools.    Yes Historical Provider, MD  carvedilol (COREG) 12.5 MG tablet Take 12.5 mg by mouth 2 (two) times daily with a meal.  05/12/11  Yes Rogelia Mire, NP  cholecalciferol (VITAMIN D) 1000 UNITS tablet Take 1,000 Units by mouth daily.   Yes Historical Provider, MD  donepezil (ARICEPT) 10 MG tablet Take 10 mg by mouth at bedtime.   Yes Historical Provider, MD  Fluticasone Furoate-Vilanterol 100-25 MCG/INH AEPB Inhale 1 puff into the lungs daily with breakfast.   Yes Historical Provider, MD  hydrocortisone cream (PREPARATION H HYDROCORTISONE) 1 % Apply 1 application topically 2 (two) times daily as needed. For hemmoroids.   Yes Historical Provider, MD  losartan (COZAAR) 50 MG tablet Take 50 mg by mouth daily.   Yes Historical Provider, MD  mirabegron ER (MYRBETRIQ) 50 MG TB24 tablet Take 50 mg by mouth daily with breakfast.   Yes Historical Provider, MD  nitroGLYCERIN  (NITROLINGUAL) 0.4 MG/SPRAY spray Place 1 spray under the tongue every 5 (five) minutes as needed for chest pain. For chest pain 08/14/11  Yes Josue Hector, MD  nitroGLYCERIN (NITROSTAT) 0.3 MG SL tablet Place 0.3 mg under the tongue every 5 (five) minutes as needed for chest pain.   Yes Historical Provider, MD  oxybutynin (DITROPAN XL) 15 MG 24 hr tablet Take 15 mg by mouth daily.     Yes Historical Provider, MD  phenytoin (DILANTIN) 100 MG ER capsule Take 200 mg by mouth 2 (two) times daily.   Yes Historical Provider, MD  spironolactone (ALDACTONE) 25 MG tablet Take 25 mg by mouth every morning.   Yes Historical Provider, MD    BP 123/60 mmHg  Pulse 80  Temp(Src) 97.5 F (36.4 C) (Oral)  Ht 5\' 8"  (1.727 m)  Wt 74.39 kg  BMI 24.94 kg/m2  SpO2 91% Physical Exam  Constitutional: He is oriented to person, place, and time. He appears well-developed and well-nourished. No distress.  HENT:  Head: Normocephalic and atraumatic.  Right Ear: External ear normal.  Left Ear: External ear normal.  Nose: Nose normal.  Mouth/Throat: Uvula is midline, oropharynx is clear and moist and mucous membranes are normal.  Eyes: Conjunctivae, EOM and lids are normal. Pupils are equal, round, and reactive to light. Right eye exhibits no discharge. Left eye exhibits no discharge. No scleral icterus.  Neck: Normal range of motion. Neck supple.  Cardiovascular: Normal rate, regular rhythm, normal heart sounds, intact distal pulses and normal pulses.   Pulmonary/Chest: Effort normal and breath sounds normal. No respiratory distress. He has no wheezes. He has no rales.  Abdominal: Soft. Normal appearance and bowel sounds are normal. He exhibits no distension and no mass. There is no tenderness. There is no rigidity, no rebound and no guarding.  Genitourinary: Rectal exam shows external hemorrhoid and tenderness. Rectal exam shows no fissure.  External hemorrhoids with associated TTP on exam. No gross blood. No dark  tarry stool. Streaks of blood present in patient's diaper.   Musculoskeletal: Normal range of motion. He exhibits no edema or tenderness.  Neurological: He is alert and oriented to person, place, and time. He has normal strength. No cranial nerve deficit or sensory deficit.  Skin: Skin is warm, dry and intact. No rash noted. He is not diaphoretic. No erythema. No pallor.  Psychiatric: He has a  normal mood and affect. His speech is normal and behavior is normal.  Nursing note and vitals reviewed.   ED Course  Procedures (including critical care time)  Labs Review Labs Reviewed  CBC WITH DIFFERENTIAL/PLATELET - Abnormal; Notable for the following:    WBC 10.8 (*)    RBC 2.97 (*)    Hemoglobin 8.9 (*)    HCT 26.6 (*)    Neutro Abs 8.4 (*)    Monocytes Absolute 1.1 (*)    All other components within normal limits  COMPREHENSIVE METABOLIC PANEL - Abnormal; Notable for the following:    CO2 20 (*)    Glucose, Bld 104 (*)    BUN 22 (*)    Calcium 8.2 (*)    Total Protein 5.9 (*)    Albumin 3.3 (*)    ALT 15 (*)    All other components within normal limits  POC OCCULT BLOOD, ED - Abnormal; Notable for the following:    Fecal Occult Bld POSITIVE (*)    All other components within normal limits  PROTIME-INR  TYPE AND SCREEN    Imaging Review No results found.   I have personally reviewed and evaluated these lab results as part of my medical decision-making.   EKG Interpretation None      MDM   Final diagnoses:  Anemia, unspecified anemia type  Rectal bleeding    82 year old male presents with diarrhea, dark stool, and bright red blood per rectum, which he states started this morning. Denies fever, chills, abdominal pain, nausea, vomiting. States he has hemorrhoids, and thinks his symptoms may be related to this. Per record review, patient was evaluated in 2013 and admitted for lower GI bleed, which was suspected to be diverticular. He did not have a colonoscopy at that  time, as his last colonoscopy was just a year prior.   Patient is afebrile. Vital signs stable. Abdomen soft, non-tender, non-distended. No rebound, guarding, or masses. On exam, patient has external hemorrhoids with associated TTP. No gross blood. No dark tarry stool. Streaks of blood present in patient's diaper.  Hemoccult positive. Labs pending. CBC remarkable for hemoglobin 8.9, which appears decreased from prior. CMP unremarkable. INR 1.18. Type and screen obtained.  Patient discussed with and seen by Dr. Christy Gentles. Given drop in hemoglobin, will consult hospitalist for admission for serial hemoglobin and monitoring. Do not feel transfusion is indicated at this time. Patient is in agreement with plan.  Spoke with Dr. Hartford Poli (triad). Patient to be admitted for observation.  BP 123/60 mmHg  Pulse 80  Temp(Src) 97.5 F (36.4 C) (Oral)  Ht 5\' 8"  (1.727 m)  Wt 74.39 kg  BMI 24.94 kg/m2  SpO2 91%     Marella Chimes, PA-C 07/01/15 1722  Ripley Fraise, MD 07/03/15 [phone]

## 2015-07-01 NOTE — H&P (Signed)
[name] History and Physical  [name] [id number] DOB: 01-10-1933 DOA: 07/01/2015  Referring physician: EDP PCP: Tivis Ringer, MD   Chief Complaint: Rectal bleeding  HPI: [name] is a 82 y.o. male with past medical history of hypertension, anemia and chronic systolic CHF with LVEF of 25-30%, patient came into the hospital complaining about rectal bleeding. Patient is a resident at [facility]. Patient reported 2 or 3 loose bowel movements earlier today after which she developed rectal bleeding. Per ED PA he mentioned dark stools (he did not mention that to me, patient is legally blind). Patient takes aspirin no other anticoagulants. He had similar episode in 2013 for which he was admitted to the hospital for observation. In the ED his hemoglobin is 8.9, no recent hemoglobin last one was in May 2015 which was 12.4. His BUN is slightly elevated at 22. Patient reported that his diarrhea stopped, denies any abdominal pain or other complaints. Patient seen with his daughter-in-law at bedside.  Review of Systems:  Constitutional: negative for anorexia, fevers and sweats Eyes: negative for irritation, redness and visual disturbance Ears, nose, mouth, throat, and face: negative for earaches, epistaxis, nasal congestion and sore throat Respiratory: negative for cough, dyspnea on exertion, sputum and wheezing Cardiovascular: negative for chest pain, dyspnea, lower extremity edema, orthopnea, palpitations and syncope Gastrointestinal: negative for abdominal pain, constipation, diarrhea, melena, nausea and vomiting Genitourinary:negative for dysuria, frequency and hematuria Hematologic/lymphatic: negative for bleeding, easy bruising and lymphadenopathy Musculoskeletal:negative for arthralgias, muscle weakness and stiff joints Neurological: negative for coordination problems, gait problems, headaches and weakness Endocrine: negative for diabetic symptoms including  polydipsia, polyuria and weight loss Allergic/Immunologic: negative for anaphylaxis, hay fever and urticaria  Past Medical History  Diagnosis Date  . Hypertension   . Vision impairment     "legally blind"  . Urethral stricture     with subsequent trauma and has an indwelling Foley catheter in place during his first MI                                                 . Anemia   . LV dysfunction     EF 25 to 30% per echo May 2012  . CAD (coronary artery disease)   . Prostatic hypertrophy   . COPD (chronic obstructive pulmonary disease) ([place])   . High cholesterol   . Acute MI [facility]) Dec 2010    cardiac catheriziation and stenting of the LAD (bare metal) 03/14/09              . NSTEMI (non-ST elevated myocardial infarction) [facility]) Nov 2011    with LV dysfunction  . Angina   . Seizures ([place]) ~ 2006    "when blood pressure went up too high"; on Dilantin  . Blood transfusion   . Urinary catheter in place     chronic  . DEMENTIA    Past Surgical History  Procedure Laterality Date  . Tonsillectomy      "when I was small"  . 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 Cardiac catheterization  02/2010  . Coronary angioplasty with stent placement  03/2009    "1"; stent to LAD  . Cholecystectomy  05/30/11  . Cholecystectomy  05/30/2011    Procedure: LAPAROSCOPIC CHOLECYSTECTOMY;  Surgeon: Aaron Edelman  Billy Fischer, DO;  Location: [facility];  Service: General;  Laterality: N/A;   Social History:   reports that he quit smoking about 10 years ago. His smoking use included Cigarettes. He smoked 0.00 packs per day for 50 years. He has never used smokeless tobacco. He reports that he does not drink alcohol or use illicit drugs.  Allergies  Allergen Reactions  . Red Dye Other (See Comments)    Per Mar.  . Iodinated Diagnostic Agents Rash    Pt given 50 mg Benadryl prior to Omnipaque injection, tolerated well.    Family History  Problem Relation Age of Onset  .  Colon cancer Neg Hx      Prior to Admission medications   Medication Sig Start Date End Date Taking? Authorizing Provider  aspirin 325 MG EC tablet Take 325 mg by mouth daily with breakfast.   Yes Historical Provider, MD  atorvastatin (LIPITOR) 10 MG tablet Take 10 mg by mouth daily.   Yes Historical Provider, MD  bismuth subsalicylate (PEPTO BISMOL) 262 MG/15ML suspension Take 30 mLs by mouth as needed for diarrhea or loose stools.    Yes Historical Provider, MD  carvedilol (COREG) 12.5 MG tablet Take 12.5 mg by mouth 2 (two) times daily with a meal.  05/12/11  Yes Rogelia Mire, NP  cholecalciferol (VITAMIN D) 1000 UNITS tablet Take 1,000 Units by mouth daily.   Yes Historical Provider, MD  donepezil (ARICEPT) 10 MG tablet Take 10 mg by mouth at bedtime.   Yes Historical Provider, MD  Fluticasone Furoate-Vilanterol 100-25 MCG/INH AEPB Inhale 1 puff into the lungs daily with breakfast.   Yes Historical Provider, MD  hydrocortisone cream (PREPARATION H HYDROCORTISONE) 1 % Apply 1 application topically 2 (two) times daily as needed. For hemmoroids.   Yes Historical Provider, MD  losartan (COZAAR) 50 MG tablet Take 50 mg by mouth daily.   Yes Historical Provider, MD  mirabegron ER (MYRBETRIQ) 50 MG TB24 tablet Take 50 mg by mouth daily with breakfast.   Yes Historical Provider, MD  nitroGLYCERIN (NITROLINGUAL) 0.4 MG/SPRAY spray Place 1 spray under the tongue every 5 (five) minutes as needed for chest pain. For chest pain 08/14/11  Yes Josue Hector, MD  nitroGLYCERIN (NITROSTAT) 0.3 MG SL tablet Place 0.3 mg under the tongue every 5 (five) minutes as needed for chest pain.   Yes Historical Provider, MD  oxybutynin (DITROPAN XL) 15 MG 24 hr tablet Take 15 mg by mouth daily.     Yes Historical Provider, MD  phenytoin (DILANTIN) 100 MG ER capsule Take 200 mg by mouth 2 (two) times daily.   Yes Historical Provider, MD  spironolactone (ALDACTONE) 25 MG tablet Take 25 mg by mouth every morning.   Yes  Historical Provider, MD   Physical Exam: Filed Vitals:   07/01/15 1524 07/01/15 1552  BP: 123/60   Pulse: 80   Temp: 97.5 F (36.4 C) 97.5 F (36.4 C)   Constitutional: Oriented to person, place, and time. Well-developed and well-nourished. Cooperative.  Head: Normocephalic and atraumatic.  Nose: Nose normal.  Mouth/Throat: Uvula is midline, oropharynx is clear and moist and mucous membranes are normal.  Eyes: Conjunctivae and EOM are normal. Pupils are equal, round, and reactive to light.  Neck: Trachea normal and normal range of motion. Neck supple.  Cardiovascular: Normal rate, regular rhythm, S1 normal, S2 normal, normal heart sounds and intact distal pulses.   Pulmonary/Chest: Effort normal and breath sounds normal.  Abdominal: Soft. Bowel sounds are  normal. There is no hepatosplenomegaly. There is no tenderness.  Musculoskeletal: Normal range of motion.  Neurological: Alert and oriented to person, place, and time. Has normal strength. No cranial nerve deficit or sensory deficit.  Skin: Skin is warm, dry and intact.  Psychiatric: Has a normal mood and affect. Speech is normal and behavior is normal.   Labs on Admission:  Basic Metabolic Panel:  Recent Labs Lab 07/01/15 1545  NA 136  K 3.8  CL 110  CO2 20*  GLUCOSE 104*  BUN 22*  CREATININE 1.12  CALCIUM 8.2*   Liver Function Tests:  Recent Labs Lab 07/01/15 1545  AST 20  ALT 15*  ALKPHOS 65  BILITOT 0.6  PROT 5.9*  ALBUMIN 3.3*   No results for input(s): LIPASE, AMYLASE in the last 168 hours. No results for input(s): AMMONIA in the last 168 hours. CBC:  Recent Labs Lab 07/01/15 1545  WBC 10.8*  NEUTROABS 8.4*  HGB 8.9*  HCT 26.6*  MCV 89.6  PLT 177   Cardiac Enzymes: No results for input(s): CKTOTAL, CKMB, CKMBINDEX, TROPONINI in the last 168 hours.  BNP (last 3 results) No results for input(s): BNP in the last 8760 hours.  ProBNP (last 3 results) No results for input(s): PROBNP in the  last 8760 hours.  CBG: No results for input(s): GLUCAP in the last 168 hours.  Radiological Exams on Admission: No results found.  EKG: Independently reviewed.   Assessment/Plan Principal Problem:   Rectal bleeding Active Problems:   Hypertension   Seizure disorder (HCC)   Anemia    Rectal bleeding Presented with rectal bleeding after 3 episodes of diarrhea. Diarrhea resolved per the patient. I have been told by ED PA that he mentioned he had dark stools, (patient is legally blind). Last colonoscopy in 2012 showed sigmoid diverticulosis and internal and external hemorrhoids. Per ED PA rectal examination showed probable bleeding hemorrhoids, but she is concerned about diverticular bleeding because of the "dark stools". Patient takes Pepto-Bismol which can change in stool color. Observe patient, keep on clear liquid diet, if continued to bleed we'll consider GI consultation. Hold aspirin.  Anemia Presumed acute blood loss anemia, no recent baseline, last hemoglobin from May 2015 was 12.4. Patient presented with hemoglobin of 8.9. Check hemoglobin a.m., transfuse if hemoglobin less than 7.5, expect hemoglobin to drop after hydration.  Seizure disorder Continue home medications. No recent seizure like activity.  Hypertension Hold Aldactone, and continue Coreg.  Chronic congestive heart failure With LVEF of 25-30% from remote echocardiogram in 2012. Patient does not have any symptoms of decompensation, he will have to call IV fluids overnight, discontinue in a.m.    Code Status: full code  Family Communication: plan discussed with the patient in the presence of his daughter-in-law at bedside.  Disposition Plan: observation   Time spent: 70 minutes  Cela Newcom A, MD [name] Pager [phone]

## 2015-07-01 NOTE — ED Notes (Signed)
Bed: WA01 Expected date:  Expected time:  Means of arrival:  Comments: EMS- bloody stool- elderly

## 2015-07-01 NOTE — ED Notes (Signed)
Patient believes he has a bleeding hemmorhoid.  Having some bright red blood from his rectum and dark stools.

## 2015-07-01 NOTE — Plan of Care (Signed)
RN paged this NP because pt has a yellow DNR form with him from the ALF facility but a FULL CODE order here. Pt defers decision making to his step daughter. This NP called his step daughter, Ms. Tollie Pizza, who stated he is a DNR and we should continue to follow that at the hospital. When asked about why the pt doesn't want to comment on this, she states he just doesn't like to talk about these things because he is fearful. They have discussed this in the past and the pt's wishes are for no resuscitation in case of arrest. Order changed to DNR.  [facility], NP [name]

## 2015-07-02 DIAGNOSIS — D62 Acute posthemorrhagic anemia: Secondary | ICD-10-CM | POA: Diagnosis present

## 2015-07-02 DIAGNOSIS — I5022 Chronic systolic (congestive) heart failure: Secondary | ICD-10-CM | POA: Diagnosis present

## 2015-07-02 DIAGNOSIS — Z87891 Personal history of nicotine dependence: Secondary | ICD-10-CM | POA: Diagnosis not present

## 2015-07-02 DIAGNOSIS — K625 Hemorrhage of anus and rectum: Secondary | ICD-10-CM | POA: Diagnosis present

## 2015-07-02 DIAGNOSIS — D649 Anemia, unspecified: Secondary | ICD-10-CM | POA: Diagnosis not present

## 2015-07-02 DIAGNOSIS — Z9102 Food additives allergy status: Secondary | ICD-10-CM | POA: Diagnosis not present

## 2015-07-02 DIAGNOSIS — K649 Unspecified hemorrhoids: Secondary | ICD-10-CM | POA: Diagnosis present

## 2015-07-02 DIAGNOSIS — Z79899 Other long term (current) drug therapy: Secondary | ICD-10-CM | POA: Diagnosis not present

## 2015-07-02 DIAGNOSIS — G40909 Epilepsy, unspecified, not intractable, without status epilepticus: Secondary | ICD-10-CM | POA: Diagnosis not present

## 2015-07-02 DIAGNOSIS — F0390 Unspecified dementia without behavioral disturbance: Secondary | ICD-10-CM | POA: Diagnosis present

## 2015-07-02 DIAGNOSIS — I10 Essential (primary) hypertension: Secondary | ICD-10-CM | POA: Diagnosis not present

## 2015-07-02 DIAGNOSIS — J449 Chronic obstructive pulmonary disease, unspecified: Secondary | ICD-10-CM | POA: Diagnosis present

## 2015-07-02 DIAGNOSIS — Z955 Presence of coronary angioplasty implant and graft: Secondary | ICD-10-CM | POA: Diagnosis not present

## 2015-07-02 DIAGNOSIS — I252 Old myocardial infarction: Secondary | ICD-10-CM | POA: Diagnosis not present

## 2015-07-02 DIAGNOSIS — E7800 Pure hypercholesterolemia, unspecified: Secondary | ICD-10-CM | POA: Diagnosis present

## 2015-07-02 DIAGNOSIS — H548 Legal blindness, as defined in USA: Secondary | ICD-10-CM | POA: Diagnosis present

## 2015-07-02 DIAGNOSIS — Z66 Do not resuscitate: Secondary | ICD-10-CM | POA: Diagnosis present

## 2015-07-02 DIAGNOSIS — I2510 Atherosclerotic heart disease of native coronary artery without angina pectoris: Secondary | ICD-10-CM | POA: Diagnosis present

## 2015-07-02 DIAGNOSIS — N400 Enlarged prostate without lower urinary tract symptoms: Secondary | ICD-10-CM | POA: Diagnosis present

## 2015-07-02 DIAGNOSIS — Z91041 Radiographic dye allergy status: Secondary | ICD-10-CM | POA: Diagnosis not present

## 2015-07-02 DIAGNOSIS — I110 Hypertensive heart disease with heart failure: Secondary | ICD-10-CM | POA: Diagnosis present

## 2015-07-02 DIAGNOSIS — Z7982 Long term (current) use of aspirin: Secondary | ICD-10-CM | POA: Diagnosis not present

## 2015-07-02 LAB — BASIC METABOLIC PANEL
Anion gap: 8 (ref 5–15)
BUN: 18 mg/dL (ref 6–20)
CO2: 19 mmol/L — ABNORMAL LOW (ref 22–32)
Calcium: 7.9 mg/dL — ABNORMAL LOW (ref 8.9–10.3)
Chloride: 113 mmol/L — ABNORMAL HIGH (ref 101–111)
Creatinine, Ser: 0.92 mg/dL (ref 0.61–1.24)
GFR calc Af Amer: >60 mL/min (ref >60)
GFR calc non Af Amer: >60 mL/min (ref >60)
Glucose, Bld: 90 mg/dL (ref 65–99)
Potassium: 3.5 mmol/L (ref 3.5–5.1)
Sodium: 140 mmol/L (ref 135–145)

## 2015-07-02 LAB — CBC
HCT: 21 % — ABNORMAL LOW (ref 39.0–52.0)
Hemoglobin: 7 g/dL — ABNORMAL LOW (ref 13.0–17.0)
MCH: 29.5 pg (ref 26.0–34.0)
MCHC: 33.3 g/dL (ref 30.0–36.0)
MCV: 88.6 fL (ref 78.0–100.0)
Platelets: 148 10*3/uL — ABNORMAL LOW (ref 150–400)
RBC: 2.37 MIL/uL — ABNORMAL LOW (ref 4.22–5.81)
RDW: 13.4 % (ref 11.5–15.5)
WBC: 6.1 10*3/uL (ref 4.0–10.5)

## 2015-07-02 LAB — HEMOGLOBIN A1C
Hgb A1c MFr Bld: 5.3 % (ref 4.8–5.6)
MEAN PLASMA GLUCOSE: 105 mg/dL

## 2015-07-02 LAB — PREPARE RBC (CROSSMATCH)

## 2015-07-02 MED ORDER — POTASSIUM CHLORIDE CRYS ER 20 MEQ PO TBCR
60.0000 meq | EXTENDED_RELEASE_TABLET | Freq: Once | ORAL | Status: AC
  Administered 2015-07-02: 60 meq via ORAL
  Filled 2015-07-02: qty 3

## 2015-07-02 MED ORDER — FUROSEMIDE 10 MG/ML IJ SOLN
40.0000 mg | Freq: Once | INTRAMUSCULAR | Status: AC
  Administered 2015-07-02: 40 mg via INTRAVENOUS
  Filled 2015-07-02: qty 4

## 2015-07-02 NOTE — Progress Notes (Signed)
Tech called nurse to room stating patient had a bloody bowel movement.  RN did note stool with bright red blood in toilet as well as blood around patients rectum.  MD notified.

## 2015-07-02 NOTE — Progress Notes (Signed)
[name] PROGRESS NOTE   [name] [id number] DOB: 09/23/1932 DOA: 07/01/2015 PCP: Tivis Ringer, MD  HPI/Subjective: Denies any bleeding since yesterday. No bowel movements. His hemoglobin dropped to 7.0.  Assessment/Plan: Principal Problem:   Rectal bleeding Active Problems:   Hypertension   Seizure disorder (HCC)   Anemia   Rectal bleeding Presented with rectal bleeding after 3 episodes of diarrhea. Diarrhea resolved per the patient. No bleeding since admission. Keep on clear liquid diet, if started to bleed will call GI. Continue to hold aspirin.  Anemia Presumed acute blood loss anemia, no recent baseline, last hemoglobin from May 2015 was 12.4. Patient presented with hemoglobin of 8.9. Hemoglobin is 7.0 this morning, will transfuse 2 units of packed RBCs. Give Lasix with transfusion. The drop in hemoglobin likely secondary to hemodilution from IV fluids and bleeding together, no bleeding since yesterday.  Seizure disorder Continue home medications. No recent seizure like activity.  Hypertension Hold Aldactone, and continue Coreg.  Chronic congestive heart failure With LVEF of 25-30% from remote echocardiogram in 2012. Patient does not have any symptoms of decompensation, he will have to call IV fluids overnight, discontinue in a.m.    Code Status: DNR Family Communication: Plan discussed with the patient. Disposition Plan: Remains inpatient Diet: Diet clear liquid Room service appropriate?: Yes; Fluid consistency:: Thin  Consultants:  None  Procedures:  None  Antibiotics:  None   Objective: Filed Vitals:   07/02/15 1315 07/02/15 1345  BP: 109/52 110/60  Pulse: 62 73  Temp: 97.3 F (36.3 C) 97.2 F (36.2 C)  Resp: 16 16    Intake/Output Summary (Last 24 hours) at 07/02/15 1401 Last data filed at 07/02/15 1300  Gross per 24 hour  Intake   2720 ml  Output    900 ml  Net   1820 ml   Filed Weights   07/01/15 1524  07/01/15 1831  Weight: 74.39 kg (164 lb) 74.39 kg (164 lb)    Exam: General: Alert and awake, oriented x3, not in any acute distress. HEENT: anicteric sclera, pupils reactive to light and accommodation, EOMI CVS: S1-S2 clear, no murmur rubs or gallops Chest: clear to auscultation bilaterally, no wheezing, rales or rhonchi Abdomen: soft nontender, nondistended, normal bowel sounds, no organomegaly Extremities: no cyanosis, clubbing or edema noted bilaterally Neuro: Cranial nerves II-XII intact, no focal neurological deficits  Data Reviewed: Basic Metabolic Panel:  Recent Labs Lab 07/01/15 1545 07/02/15 0331  NA 136 140  K 3.8 3.5  CL 110 113*  CO2 20* 19*  GLUCOSE 104* 90  BUN 22* 18  CREATININE 1.12 0.92  CALCIUM 8.2* 7.9*   Liver Function Tests:  Recent Labs Lab 07/01/15 1545  AST 20  ALT 15*  ALKPHOS 65  BILITOT 0.6  PROT 5.9*  ALBUMIN 3.3*   No results for input(s): LIPASE, AMYLASE in the last 168 hours. No results for input(s): AMMONIA in the last 168 hours. CBC:  Recent Labs Lab 07/01/15 1545 07/02/15 0331  WBC 10.8* 6.1  NEUTROABS 8.4*  --   HGB 8.9* 7.0*  HCT 26.6* 21.0*  MCV 89.6 88.6  PLT 177 148*   Cardiac Enzymes: No results for input(s): CKTOTAL, CKMB, CKMBINDEX, TROPONINI in the last 168 hours. BNP (last 3 results) No results for input(s): BNP in the last 8760 hours.  ProBNP (last 3 results) No results for input(s): PROBNP in the last 8760 hours.  CBG: No results for input(s): GLUCAP in the last 168 hours.  Micro Recent Results (from  the past 240 hour(s))  MRSA PCR Screening     Status: None   Collection Time: 07/01/15  9:16 PM  Result Value Ref Range Status   MRSA by PCR NEGATIVE NEGATIVE Final    Comment:        The GeneXpert MRSA Assay (FDA approved for NASAL specimens only), is one component of a comprehensive MRSA colonization surveillance program. It is not intended to diagnose MRSA infection nor to guide or monitor  treatment for MRSA infections.      Studies: No results found.  Scheduled Meds: . atorvastatin  10 mg Oral Daily  . carvedilol  12.5 mg Oral BID WC  . donepezil  10 mg Oral QHS  . fluticasone furoate-vilanterol  1 puff Inhalation Q breakfast  . hydrocortisone   Rectal BID  . losartan  50 mg Oral Daily  . mirabegron ER  50 mg Oral Q breakfast  . oxybutynin  15 mg Oral Daily  . phenytoin  200 mg Oral BID  . sodium chloride flush  3 mL Intravenous Q12H   Continuous Infusions:      Time spent: 35 minutes    [facility] A  [name] Pager [phone] If 7PM-7AM, please contact night-coverage at [email or web address], password [facility] 07/02/2015, 2:01 PM

## 2015-07-03 LAB — BASIC METABOLIC PANEL
Anion gap: 7 (ref 5–15)
BUN: 12 mg/dL (ref 6–20)
CHLORIDE: 114 mmol/L — AB (ref 101–111)
CO2: 21 mmol/L — AB (ref 22–32)
CREATININE: 0.97 mg/dL (ref 0.61–1.24)
Calcium: 8.3 mg/dL — ABNORMAL LOW (ref 8.9–10.3)
GFR calc Af Amer: >60 mL/min (ref >60)
Glucose, Bld: 90 mg/dL (ref 65–99)
Potassium: 4.1 mmol/L (ref 3.5–5.1)
Sodium: 142 mmol/L (ref 135–145)

## 2015-07-03 LAB — CBC
HEMATOCRIT: 26.8 % — AB (ref 39.0–52.0)
Hemoglobin: 9 g/dL — ABNORMAL LOW (ref 13.0–17.0)
MCH: 28.6 pg (ref 26.0–34.0)
MCHC: 33.6 g/dL (ref 30.0–36.0)
MCV: 85.1 fL (ref 78.0–100.0)
PLATELETS: 157 10*3/uL (ref 150–400)
RBC: 3.15 MIL/uL — AB (ref 4.22–5.81)
RDW: 16.8 % — AB (ref 11.5–15.5)
WBC: 6.8 10*3/uL (ref 4.0–10.5)

## 2015-07-03 MED ORDER — POLYETHYLENE GLYCOL 3350 17 G PO PACK
17.0000 g | PACK | Freq: Every day | ORAL | Status: DC
  Administered 2015-07-03 – 2015-07-04 (×2): 17 g via ORAL
  Filled 2015-07-03 (×2): qty 1

## 2015-07-03 NOTE — Progress Notes (Signed)
CSW continuing to follow.  Pt step-daughter, Ronnald Collum arrived to unit (ph#: [phone]) and would like to be updated regarding when pt returning to [facility].   CSW will continue to follow.  Alison Murray, MSW, LCSW Clinical Social Work Weekend coverage [phone]

## 2015-07-03 NOTE — Clinical Social Work Note (Signed)
Clinical Social Work Assessment  Patient Details  Name: [name] MRN: [id number] Date of Birth: June 04, 1932  Date of referral:  07/03/15               Reason for consult:  Discharge Planning                Permission sought to share information with:    Permission granted to share information::     Name::        Agency::     Relationship::     Contact Information:     Housing/Transportation Living arrangements for the past 2 months:  [place] of Information:  Patient Patient Interpreter Needed:  None Criminal Activity/Legal Involvement Pertinent to Current Situation/Hospitalization:  No - Comment as needed Significant Relationships:  Other Family Members Lives with:  Facility Resident Do you feel safe going back to the place where you live?  Yes Need for family participation in patient care:  No (Coment)  Care giving concerns:  Pt admitted from [facility]. Pt states he plans to return.    Social Worker assessment / plan:    CSW received referral that pt admitted from [facility].   CSW met with pt at bedside. CSW introduced self and explained role. Pt reports that he resides at [facility]. Pt plans to return when medically ready. Per MD, potential d/c tomorrow.   CSW spoke with [facility] who stated pt can return Sunday if ready. Per facility, pt independent with dressing and bathing and uses walker for ambulating. PT/OT orders in place, but have not assessed in hospital.   CSW to continue to follow to assist with pt discharge back to [facility] when medically ready.  Employment status:    Insurance informationEducational psychologist PT Recommendations:  Not assessed at this time Information / Referral to community resources:  Other (Comment Required) (referral back to [facility])  Patient/Family's Response to care:  Pt alert and oriented x 4. Pt expressed that he is glad that PT ordered as he feels weaker  due to acute illness. Pt wants to return to [facility].   Patient/Family's Understanding of and Emotional Response to Diagnosis, Current Treatment, and Prognosis:  Pt displayed understanding surrounding pt diagnosis and treatment plan.   Emotional Assessment Appearance:  Appears stated age Attitude/Demeanor/Rapport:  Other (appropriate) Affect (typically observed):  Pleasant, Appropriate Orientation:  Oriented to Self, Oriented to Place, Oriented to  Time, Oriented to Situation Alcohol / Substance use:  Not Applicable Psych involvement (Current and /or in the community):  No (Comment)  Discharge Needs  Concerns to be addressed:  Discharge Planning Concerns Readmission within the last 30 days:  No Current discharge risk:  None Barriers to Discharge:  Continued Medical Work up   Alison Murray A, LCSW 07/03/2015, 3:36 PM Weekend coverage  [phone]

## 2015-07-03 NOTE — Progress Notes (Signed)
[name] PROGRESS NOTE   [name] [name] DOB: [birth year] DOA: 07/01/2015 PCP: Tivis Ringer, MD  HPI/Subjective: Had 1 bowel movement since yesterday with bright red blood, per nursing staff appears to be from hemorrhoids.  Assessment/Plan: Principal Problem:   Rectal bleeding Active Problems:   Hypertension   Seizure disorder (HCC)   Anemia   Rectal bleeding Presented with rectal bleeding after 3 episodes of diarrhea. Diarrhea resolved per the patient. Had 1 bowel movement, per nursing staff very clear that he has bleeding from hemorrhoids with it. Had some blood when he wipes as well. Started on soft diet, continue hydrocortisone and the Tucks for hemorrhoids. If bloody bowel movement improved can be discharged tomorrow.  Anemia Presumed acute blood loss anemia, no recent baseline, last hemoglobin from May 2015 was 12.4. Patient presented with hemoglobin of 8.9. Hemoglobin is 7.0 this morning, will transfuse 2 units of packed RBCs. Give Lasix with transfusion. Hemoglobin is okay at 9.0 today. Check an a.m.  Seizure disorder Continue home medications. No recent seizure like activity.  Hypertension Hold Aldactone, and continue Coreg.  Chronic congestive heart failure With LVEF of 25-30% from remote echocardiogram in 2012. Patient does not have any symptoms of decompensation, IV fluids discontinued, given Lasix with transfusion.   Code Status: DNR Family Communication: Plan discussed with the patient. Disposition Plan: Remains inpatient Diet: DIET SOFT Room service appropriate?: Yes; Fluid consistency:: Thin  Consultants:  None  Procedures:  None  Antibiotics:  None   Objective: Filed Vitals:   07/03/15 1045 07/03/15 1103  BP:  111/60  Pulse: 72 67  Temp:  97.8 F (36.6 C)  Resp: 18 16    Intake/Output Summary (Last 24 hours) at 07/03/15 1323 Last data filed at 07/03/15 1243  Gross per 24 hour  Intake   2195 ml    Output   4075 ml  Net  -1880 ml   Filed Weights   07/01/15 1524 07/01/15 1831  Weight: 74.39 kg (164 lb) 74.39 kg (164 lb)    Exam: General: Alert and awake, oriented x3, not in any acute distress. HEENT: anicteric sclera, pupils reactive to light and accommodation, EOMI CVS: S1-S2 clear, no murmur rubs or gallops Chest: clear to auscultation bilaterally, no wheezing, rales or rhonchi Abdomen: soft nontender, nondistended, normal bowel sounds, no organomegaly Extremities: no cyanosis, clubbing or edema noted bilaterally Neuro: Cranial nerves II-XII intact, no focal neurological deficits  Data Reviewed: Basic Metabolic Panel:  Recent Labs Lab 07/01/15 1545 07/02/15 0331 07/03/15 0341  NA 136 140 142  K 3.8 3.5 4.1  CL 110 113* 114*  CO2 20* 19* 21*  GLUCOSE 104* 90 90  BUN 22* 18 12  CREATININE 1.12 0.92 0.97  CALCIUM 8.2* 7.9* 8.3*   Liver Function Tests:  Recent Labs Lab 07/01/15 1545  AST 20  ALT 15*  ALKPHOS 65  BILITOT 0.6  PROT 5.9*  ALBUMIN 3.3*   No results for input(s): LIPASE, AMYLASE in the last 168 hours. No results for input(s): AMMONIA in the last 168 hours. CBC:  Recent Labs Lab 07/01/15 1545 07/02/15 0331 07/03/15 0341  WBC 10.8* 6.1 6.8  NEUTROABS 8.4*  --   --   HGB 8.9* 7.0* 9.0*  HCT 26.6* 21.0* 26.8*  MCV 89.6 88.6 85.1  PLT 177 148* 157   Cardiac Enzymes: No results for input(s): CKTOTAL, CKMB, CKMBINDEX, TROPONINI in the last 168 hours. BNP (last 3 results) No results for input(s): BNP in the last 8760 hours.  ProBNP (last 3 results) No results for input(s): PROBNP in the last 8760 hours.  CBG: No results for input(s): GLUCAP in the last 168 hours.  Micro Recent Results (from the past 240 hour(s))  MRSA PCR Screening     Status: None   Collection Time: 07/01/15  9:16 PM  Result Value Ref Range Status   MRSA by PCR NEGATIVE NEGATIVE Final    Comment:        The GeneXpert MRSA Assay (FDA approved for NASAL  specimens only), is one component of a comprehensive MRSA colonization surveillance program. It is not intended to diagnose MRSA infection nor to guide or monitor treatment for MRSA infections.      Studies: No results found.  Scheduled Meds: . atorvastatin  10 mg Oral Daily  . carvedilol  12.5 mg Oral BID WC  . donepezil  10 mg Oral QHS  . fluticasone furoate-vilanterol  1 puff Inhalation Q breakfast  . hydrocortisone   Rectal BID  . losartan  50 mg Oral Daily  . mirabegron ER  50 mg Oral Q breakfast  . oxybutynin  15 mg Oral Daily  . phenytoin  200 mg Oral BID  . sodium chloride flush  3 mL Intravenous Q12H   Continuous Infusions:      Time spent: 35 minutes    [facility] A  [name] Pager [phone] If 7PM-7AM, please contact night-coverage at [email or web address], password [facility] 07/03/2015, 1:23 PM  LOS: 1 day

## 2015-07-04 LAB — CBC
HEMATOCRIT: 27.4 % — AB (ref 39.0–52.0)
HEMOGLOBIN: 9.2 g/dL — AB (ref 13.0–17.0)
MCH: 29 pg (ref 26.0–34.0)
MCHC: 33.6 g/dL (ref 30.0–36.0)
MCV: 86.4 fL (ref 78.0–100.0)
Platelets: 186 10*3/uL (ref 150–400)
RBC: 3.17 MIL/uL — AB (ref 4.22–5.81)
RDW: 16.3 % — ABNORMAL HIGH (ref 11.5–15.5)
WBC: 8.1 10*3/uL (ref 4.0–10.5)

## 2015-07-04 NOTE — Progress Notes (Signed)
Patient discharged to ALF, copies of all discharge medications and instructions reviewed and questions answered. Patient stepdaughter/POA aware that pt needs rolling walker per PT recommendations and states she will order that herself through [employer] as it will be no charge to pt.  Patient to be assisted to vehicle by wheelchair once he has finished his lunch.

## 2015-07-04 NOTE — Discharge Summary (Signed)
Physician Discharge Summary  [name] [name] DOB: [birth year] DOA: 07/01/2015  PCP: Tivis Ringer, MD  Admit date: 07/01/2015 Discharge date: 07/04/2015  Time spent: 40 minutes  Recommendations for Outpatient Follow-up:  1. Follow-up with primary care physician as outpatient. 2. Might need referral to general surgery for hemorrhoid evaluation as outpatient.   Discharge Diagnoses:  Principal Problem:   Rectal bleeding Active Problems:   Hypertension   Seizure disorder ([place])   Anemia   Discharge Condition: Stable  Diet recommendation: Healthy  Filed Weights   07/01/15 1524 07/01/15 1831  Weight: 74.39 kg (164 lb) 74.39 kg (164 lb)    History of present illness:  [name] is a 82 y.o. male with past medical history of hypertension, anemia and chronic systolic CHF with LVEF of 25-30%, patient came into the hospital complaining about rectal bleeding. Patient is a resident at [facility]. Patient reported 2 or 3 loose bowel movements earlier today after which she developed rectal bleeding. Per ED PA he mentioned dark stools (he did not mention that to me, patient is legally blind). Patient takes aspirin no other anticoagulants. He had similar episode in 2013 for which he was admitted to the hospital for observation. In the ED his hemoglobin is 8.9, no recent hemoglobin last one was in May 2015 which was 12.4. His BUN is slightly elevated at 22. Patient reported that his diarrhea stopped, denies any abdominal pain or other complaints. Patient seen with his daughter-in-law at bedside.  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 Diarrhea resolved per the patient. Had 1 bowel movement, per nursing staff very clear  that he has bleeding from hemorrhoids with it. Had some blood when he wipes as well. Started on soft diet, continue hydrocortisone and the Tucks for hemorrhoids. Patient tolerated soft diet very well, had some bleeding with bowel movements. If this is persists as outpatient might consider referral to surgery as outpatient.  Anemia Presumed acute blood loss anemia, no recent baseline, last hemoglobin from May 2015 was 12.4. Patient presented with hemoglobin of 8.9. Hemoglobin is 7.0 this morning, will transfuse 2 units of packed RBCs. Give Lasix with transfusion. Hemoglobin is 9.2 today.  Seizure disorder Continue home medications. No recent seizure like activity.  Hypertension Hold Aldactone, and continue Coreg.  Chronic congestive heart failure With LVEF of 25-30% from remote echocardiogram in 2012. Patient does not have any symptoms of decompensation, IV fluids discontinued, given Lasix with transfusion.   Procedures:  None  Consultations:  None  Discharge Exam: Filed Vitals:   07/04/15 0538 07/04/15 0801  BP: 115/69   Pulse: 76 72  Temp: 98.7 F (37.1 C)   Resp: 16 16   General: Alert and awake, oriented x3, not in any acute distress. HEENT: anicteric sclera, pupils reactive to light and accommodation, EOMI CVS: S1-S2 clear, no murmur rubs or gallops Chest: clear to auscultation bilaterally, no wheezing, rales or rhonchi Abdomen: soft nontender, nondistended, normal bowel sounds, no organomegaly Extremities: no cyanosis, clubbing or edema noted bilaterally Neuro: Cranial nerves II-XII intact, no focal neurological deficits  Discharge Instructions   Discharge Instructions    Diet - low sodium heart healthy    Complete by:  As directed      Increase  activity slowly    Complete by:  As directed           Current Discharge Medication List    CONTINUE these medications which have NOT CHANGED   Details  atorvastatin (LIPITOR) 10 MG tablet Take 10 mg by  mouth daily.    bismuth subsalicylate (PEPTO BISMOL) 262 MG/15ML suspension Take 30 mLs by mouth as needed for diarrhea or loose stools.     carvedilol (COREG) 12.5 MG tablet Take 12.5 mg by mouth 2 (two) times daily with a meal.     cholecalciferol (VITAMIN D) 1000 UNITS tablet Take 1,000 Units by mouth daily.    donepezil (ARICEPT) 10 MG tablet Take 10 mg by mouth at bedtime.    Fluticasone Furoate-Vilanterol 100-25 MCG/INH AEPB Inhale 1 puff into the lungs daily with breakfast.    hydrocortisone cream (PREPARATION H HYDROCORTISONE) 1 % Apply 1 application topically 2 (two) times daily as needed. For hemmoroids.    losartan (COZAAR) 50 MG tablet Take 50 mg by mouth daily.    mirabegron ER (MYRBETRIQ) 50 MG TB24 tablet Take 50 mg by mouth daily with breakfast.    nitroGLYCERIN (NITROLINGUAL) 0.4 MG/SPRAY spray Place 1 spray under the tongue every 5 (five) minutes as needed for chest pain. For chest pain    nitroGLYCERIN (NITROSTAT) 0.3 MG SL tablet Place 0.3 mg under the tongue every 5 (five) minutes as needed for chest pain.    oxybutynin (DITROPAN XL) 15 MG 24 hr tablet Take 15 mg by mouth daily.      phenytoin (DILANTIN) 100 MG ER capsule Take 200 mg by mouth 2 (two) times daily.    spironolactone (ALDACTONE) 25 MG tablet Take 25 mg by mouth every morning.      STOP taking these medications     aspirin 325 MG EC tablet        Allergies  Allergen Reactions  . Red Dye Other (See Comments)    Per Mar.  . Iodinated Diagnostic Agents Rash    Pt given 50 mg Benadryl prior to Omnipaque injection, tolerated well.   Follow-up Information    Follow up with Tivis Ringer, MD In 1 week.   Specialty:  Internal Medicine   Contact information:   [place] [address] [phone]        The results of significant diagnostics from this hospitalization (including imaging, microbiology, ancillary and laboratory) are listed below for reference.     Significant Diagnostic Studies: No results found.  Microbiology: Recent Results (from the past 240 hour(s))  MRSA PCR Screening     Status: None   Collection Time: 07/01/15  9:16 PM  Result Value Ref Range Status   MRSA by PCR NEGATIVE NEGATIVE Final    Comment:        The GeneXpert MRSA Assay (FDA approved for NASAL specimens only), is one component of a comprehensive MRSA colonization surveillance program. It is not intended to diagnose MRSA infection nor to guide or monitor treatment for MRSA infections.      Labs: Basic Metabolic Panel:  Recent Labs Lab 07/01/15 1545 07/02/15 0331 07/03/15 0341  NA 136 140 142  K 3.8 3.5 4.1  CL 110 113* 114*  CO2 20* 19* 21*  GLUCOSE 104* 90 90  BUN 22* 18 12  CREATININE 1.12 0.92 0.97  CALCIUM 8.2* 7.9* 8.3*   Liver Function Tests:  Recent Labs Lab 07/01/15 1545  AST 20  ALT 15*  ALKPHOS 65  BILITOT 0.6  PROT 5.9*  ALBUMIN 3.3*   No results for input(s): LIPASE, AMYLASE in the last 168 hours. No results for input(s): AMMONIA in the last 168 hours. CBC:  Recent Labs Lab 07/01/15 1545 07/02/15 0331 07/03/15 0341 07/04/15 0334  WBC 10.8* 6.1 6.8 8.1  NEUTROABS 8.4*  --   --   --   HGB 8.9* 7.0* 9.0* 9.2*  HCT 26.6* 21.0* 26.8* 27.4*  MCV 89.6 88.6 85.1 86.4  PLT 177 148* 157 186   Cardiac Enzymes: No results for input(s): CKTOTAL, CKMB, CKMBINDEX, TROPONINI in the last 168 hours. BNP: BNP (last 3 results) No results for input(s): BNP in the last 8760 hours.  ProBNP (last 3 results) No results for input(s): PROBNP in the last 8760 hours.  CBG: No results for input(s): GLUCAP in the last 168 hours.     Signed:  Birdie Hopes MD.  [name] 07/04/2015, 10:30 AM

## 2015-07-04 NOTE — NC FL2 (Signed)
[place] LEVEL OF CARE SCREENING TOOL     IDENTIFICATION  Patient Name: [name] Birthdate: 1932-10-07 Sex: male Admission Date (Current Location): 07/01/2015  [facility] and [place] Number:  Herbalist and Address:  [facility],  [place] [address], [place]      Provider Number: [id number]  Attending Physician Name and Address:  Verlee Monte, MD  Relative Name and Phone Number:       Current Level of Care: Hospital Recommended Level of Care: [place] Prior Approval Number:    Date Approved/Denied:   PASRR Number:    Discharge Plan: Other (Comment) ([name])    Current Diagnoses: Patient Active Problem List   Diagnosis Date Noted  . Rectal bleeding 07/01/2015  . UTI (lower urinary tract infection) 03/07/2012  . Lower GI bleed = suspect diverticular 03/06/2012  . Chest pain, unspecified 05/11/2011  . Preop cardiovascular exam 12/07/2010  . CAD (coronary artery disease) 08/24/2010  . Hypertension   . Seizure disorder ([place])   . Anemia   . LV dysfunction   . NSTEMI (non-ST elevated myocardial infarction) ([place])     Orientation RESPIRATION BLADDER Height & Weight     Self, Time, Situation, Place  Normal Incontinent Weight: 164 lb (74.39 kg) Height:  5\' 8"  (172.7 cm)  BEHAVIORAL SYMPTOMS/MOOD NEUROLOGICAL BOWEL NUTRITION STATUS   (no behaviors) Convulsions/Seizures (hx of seizure disorder) Continent  (no salt added)  AMBULATORY STATUS COMMUNICATION OF NEEDS Skin   Limited Assist Verbally Normal                       Personal Care Assistance Level of Assistance  Bathing, Feeding, Dressing Bathing Assistance: Independent Feeding assistance: Independent Dressing Assistance: Independent     Functional Limitations Info  Sight, Hearing, Speech Sight Info: Adequate Hearing Info: Adequate Speech Info: Adequate    SPECIAL CARE FACTORS FREQUENCY  PT (By licensed PT), OT (By  licensed OT)     PT Frequency: 3/wk with home health at ALF OT Frequency: 3/wk with home health at ALF            Contractures Contractures Info: Not present    Additional Factors Info  Code Status, Allergies Code Status Info: DNR code status Allergies Info: Red Dye, Iodinated Diagnostic Agents            Discharge Medications: CONTINUE these medications which have NOT CHANGED   Details  atorvastatin (LIPITOR) 10 MG tablet Take 10 mg by mouth daily.    bismuth subsalicylate (PEPTO BISMOL) 262 MG/15ML suspension Take 30 mLs by mouth as needed for diarrhea or loose stools.     carvedilol (COREG) 12.5 MG tablet Take 12.5 mg by mouth 2 (two) times daily with a meal.     cholecalciferol (VITAMIN D) 1000 UNITS tablet Take 1,000 Units by mouth daily.    donepezil (ARICEPT) 10 MG tablet Take 10 mg by mouth at bedtime.    Fluticasone Furoate-Vilanterol 100-25 MCG/INH AEPB Inhale 1 puff into the lungs daily with breakfast.    hydrocortisone cream (PREPARATION H HYDROCORTISONE) 1 % Apply 1 application topically 2 (two) times daily as needed. For hemmoroids.    losartan (COZAAR) 50 MG tablet Take 50 mg by mouth daily.    mirabegron ER (MYRBETRIQ) 50 MG TB24 tablet Take 50 mg by mouth daily with breakfast.    nitroGLYCERIN (NITROLINGUAL) 0.4 MG/SPRAY spray Place 1 spray under the tongue every 5 (five) minutes as  needed for chest pain. For chest pain    nitroGLYCERIN (NITROSTAT) 0.3 MG SL tablet Place 0.3 mg under the tongue every 5 (five) minutes as needed for chest pain.    oxybutynin (DITROPAN XL) 15 MG 24 hr tablet Take 15 mg by mouth daily.     phenytoin (DILANTIN) 100 MG ER capsule Take 200 mg by mouth 2 (two) times daily.    spironolactone (ALDACTONE) 25 MG tablet Take 25 mg by mouth every morning.      STOP taking these medications     aspirin 325 MG EC tablet         Relevant Imaging Results:  Relevant Lab  Results:   Additional Information SSN: [id number]  [name], [name]

## 2015-07-04 NOTE — Evaluation (Signed)
Physical Therapy Evaluation Patient Details Name: [name] MRN: [id number] DOB: 1933/03/17 Today's Date: 07/04/2015   History of Present Illness  82 yo male admitted with rectal bleeding. Hx of HTN, anemia, CHF, Sz, legally blind, COPD, NSTEMI, CAD, dementia.   Clinical Impression  On eval, pt was Min guard assist for mobility-walked ~225 feet with RW. Dyspnea 2/4. Pt fatigued after walk. Recommend HHPT follow up at ALF. Per chart, pt uses walker however during session pt stated he did not use a walker for ambulation at ALF.    Follow Up Recommendations Home health PT (at ALF)    Equipment Recommendations  Rolling walker with 5" wheels (if pt does not have one)    Recommendations for Other Services       Precautions / Restrictions Precautions Precautions: Fall Restrictions Weight Bearing Restrictions: No      Mobility  Bed Mobility               General bed mobility comments: oob in recliner  Transfers Overall transfer level: Needs assistance Equipment used: Rolling walker (2 wheeled) Transfers: Sit to/from Stand Sit to Stand: Supervision         General transfer comment: for safety  Ambulation/Gait Ambulation/Gait assistance: Min guard Ambulation Distance (Feet): 225 Feet Assistive device: Rolling walker (2 wheeled) Gait Pattern/deviations: Step-through pattern;Decreased stride length     General Gait Details: very quick pace. close guard for safety. dyspnea 2/4  Stairs            Wheelchair Mobility    Modified Rankin (Stroke Patients Only)       Balance                                             Pertinent Vitals/Pain Pain Assessment: No/denies pain    Home Living Family/patient expects to be discharged to:: Assisted living     Type of Home: Assisted living Home Access: Level entry     Home Layout: One level   Additional Comments: per chart, pt has/uses a rolling walker. per pt, he does not use a  walker    Prior Function Level of Independence: Independent with assistive device(s)               Hand Dominance        Extremity/Trunk Assessment   Upper Extremity Assessment: Overall WFL for tasks assessed           Lower Extremity Assessment: Generalized weakness      Cervical / Trunk Assessment: Kyphotic  Communication   Communication: No difficulties  Cognition Arousal/Alertness: Awake/alert Behavior During Therapy: WFL for tasks assessed/performed Overall Cognitive Status: History of cognitive impairments - at baseline                      General Comments      Exercises        Assessment/Plan    PT Assessment Patient needs continued PT services  PT Diagnosis Difficulty walking;Generalized weakness   PT Problem List Decreased strength;Decreased activity tolerance;Decreased balance;Decreased mobility  PT Treatment Interventions DME instruction;Gait training;Functional mobility training;Therapeutic activities;Patient/family education;Balance training;Therapeutic exercise   PT Goals (Current goals can be found in the Care Plan section) Acute Rehab PT Goals Patient Stated Goal: none stated PT Goal Formulation: With patient Time For Goal Achievement: 07/18/15 Potential to Achieve Goals: Good    Frequency  Min 3X/week   Barriers to discharge        Co-evaluation               End of Session   Activity Tolerance: Patient tolerated treatment well Patient left: in chair;with call bell/phone within reach;with chair alarm set           Time: 1045-1055 PT Time Calculation (min) (ACUTE ONLY): 10 min   Charges:   PT Evaluation $PT Eval Low Complexity: 1 Procedure     PT G Codes:        Weston Anna, MPT Pager: [phone]

## 2015-07-04 NOTE — Progress Notes (Signed)
Patient will discharge to [facility] Anticipated discharge date: 4/2 Family notified: dtr at bedside Transportation by dtr  [place] signing off.  Domenica Reamer, [place] Social Worker [phone]

## 2015-07-04 NOTE — Care Management Note (Signed)
Case Management Note  Patient Details  Name: [name] MRN: [id number] Date of Birth: 03-Jul-1932  Subjective/Objective:   Rectal bleeding, Hypertension                 Action/Plan: Discharge Planning:  Chart reviewed.  CSW following for ALF. [facility] will arrange PT at facility.   Expected Discharge Date:  07/04/2015            Expected Discharge Plan:  Assisted Living / Rest Home  In-House Referral:  Clinical Social Work  Discharge planning Services  CM Consult  Post Acute Care Choice:  NA Choice offered to:  NA  DME Arranged:  N/A DME Agency:  NA  HH Arranged:  NA HH Agency:  NA  Status of Service:  Completed, signed off  Medicare Important Message Given:    Date Medicare IM Given:    Medicare IM give by:    Date Additional Medicare IM Given:    Additional Medicare Important Message give by:     If discussed at [place] of Stay Meetings, dates discussed:    Additional Comments:  Erenest Rasher, RN 07/04/2015, 12:13 PM

## 2015-07-05 LAB — TYPE AND SCREEN
ABO/RH(D): O POS
ANTIBODY SCREEN: NEGATIVE
UNIT DIVISION: 0
Unit division: 0

## 2015-12-03 ENCOUNTER — Inpatient Hospital Stay (HOSPITAL_COMMUNITY)
Admission: EM | Admit: 2015-12-03 | Discharge: 2015-12-06 | DRG: 291 | Disposition: A | Discharge status: Home Health | Payer: Medicare Other | Attending: Internal Medicine | Admitting: Internal Medicine

## 2015-12-03 ENCOUNTER — Encounter (HOSPITAL_COMMUNITY): Payer: Self-pay | Admitting: Emergency Medicine

## 2015-12-03 ENCOUNTER — Emergency Department (HOSPITAL_COMMUNITY): Payer: Medicare Other

## 2015-12-03 ENCOUNTER — Other Ambulatory Visit (HOSPITAL_COMMUNITY): Payer: Medicare Other

## 2015-12-03 DIAGNOSIS — F0391 Unspecified dementia with behavioral disturbance: Secondary | ICD-10-CM

## 2015-12-03 DIAGNOSIS — H548 Legal blindness, as defined in USA: Secondary | ICD-10-CM | POA: Diagnosis present

## 2015-12-03 DIAGNOSIS — R569 Unspecified convulsions: Secondary | ICD-10-CM | POA: Diagnosis present

## 2015-12-03 DIAGNOSIS — J449 Chronic obstructive pulmonary disease, unspecified: Secondary | ICD-10-CM | POA: Diagnosis present

## 2015-12-03 DIAGNOSIS — R0902 Hypoxemia: Secondary | ICD-10-CM | POA: Diagnosis not present

## 2015-12-03 DIAGNOSIS — I252 Old myocardial infarction: Secondary | ICD-10-CM | POA: Diagnosis not present

## 2015-12-03 DIAGNOSIS — J9601 Acute respiratory failure with hypoxia: Secondary | ICD-10-CM | POA: Diagnosis present

## 2015-12-03 DIAGNOSIS — Z955 Presence of coronary angioplasty implant and graft: Secondary | ICD-10-CM

## 2015-12-03 DIAGNOSIS — E7800 Pure hypercholesterolemia, unspecified: Secondary | ICD-10-CM | POA: Diagnosis present

## 2015-12-03 DIAGNOSIS — R0600 Dyspnea, unspecified: Secondary | ICD-10-CM | POA: Diagnosis present

## 2015-12-03 DIAGNOSIS — J42 Unspecified chronic bronchitis: Secondary | ICD-10-CM

## 2015-12-03 DIAGNOSIS — I255 Ischemic cardiomyopathy: Secondary | ICD-10-CM | POA: Diagnosis present

## 2015-12-03 DIAGNOSIS — I2510 Atherosclerotic heart disease of native coronary artery without angina pectoris: Secondary | ICD-10-CM | POA: Diagnosis present

## 2015-12-03 DIAGNOSIS — Z87891 Personal history of nicotine dependence: Secondary | ICD-10-CM | POA: Diagnosis not present

## 2015-12-03 DIAGNOSIS — R8290 Unspecified abnormal findings in urine: Secondary | ICD-10-CM | POA: Diagnosis present

## 2015-12-03 DIAGNOSIS — I110 Hypertensive heart disease with heart failure: Principal | ICD-10-CM | POA: Diagnosis present

## 2015-12-03 DIAGNOSIS — N400 Enlarged prostate without lower urinary tract symptoms: Secondary | ICD-10-CM | POA: Diagnosis present

## 2015-12-03 DIAGNOSIS — I5043 Acute on chronic combined systolic (congestive) and diastolic (congestive) heart failure: Secondary | ICD-10-CM

## 2015-12-03 DIAGNOSIS — Z9102 Food additives allergy status: Secondary | ICD-10-CM

## 2015-12-03 DIAGNOSIS — I440 Atrioventricular block, first degree: Secondary | ICD-10-CM | POA: Diagnosis present

## 2015-12-03 DIAGNOSIS — F0390 Unspecified dementia without behavioral disturbance: Secondary | ICD-10-CM | POA: Diagnosis present

## 2015-12-03 DIAGNOSIS — I5023 Acute on chronic systolic (congestive) heart failure: Secondary | ICD-10-CM | POA: Diagnosis present

## 2015-12-03 DIAGNOSIS — Z91041 Radiographic dye allergy status: Secondary | ICD-10-CM | POA: Diagnosis not present

## 2015-12-03 DIAGNOSIS — Z79899 Other long term (current) drug therapy: Secondary | ICD-10-CM | POA: Diagnosis not present

## 2015-12-03 DIAGNOSIS — Z7982 Long term (current) use of aspirin: Secondary | ICD-10-CM | POA: Diagnosis not present

## 2015-12-03 DIAGNOSIS — IMO0001 Reserved for inherently not codable concepts without codable children: Secondary | ICD-10-CM

## 2015-12-03 DIAGNOSIS — J811 Chronic pulmonary edema: Secondary | ICD-10-CM

## 2015-12-03 LAB — CBC WITH DIFFERENTIAL/PLATELET
Basophils Absolute: 0.1 10*3/uL (ref 0.0–0.1)
Basophils Relative: 1 %
Eosinophils Absolute: 0.3 10*3/uL (ref 0.0–0.7)
Eosinophils Relative: 4 %
HCT: 39.9 % (ref 39.0–52.0)
Hemoglobin: 13.1 g/dL (ref 13.0–17.0)
Lymphocytes Relative: 10 %
Lymphs Abs: 0.9 10*3/uL (ref 0.7–4.0)
MCH: 30 pg (ref 26.0–34.0)
MCHC: 32.8 g/dL (ref 30.0–36.0)
MCV: 91.3 fL (ref 78.0–100.0)
Monocytes Absolute: 1.2 10*3/uL — ABNORMAL HIGH (ref 0.1–1.0)
Monocytes Relative: 13 %
Neutro Abs: 6.7 10*3/uL (ref 1.7–7.7)
Neutrophils Relative %: 72 %
Platelets: 251 10*3/uL (ref 150–400)
RBC: 4.37 MIL/uL (ref 4.22–5.81)
RDW: 13.1 % (ref 11.5–15.5)
WBC: 9.2 10*3/uL (ref 4.0–10.5)

## 2015-12-03 LAB — BRAIN NATRIURETIC PEPTIDE: B Natriuretic Peptide: 787.8 pg/mL — ABNORMAL HIGH (ref 0.0–100.0)

## 2015-12-03 LAB — URINALYSIS, ROUTINE W REFLEX MICROSCOPIC
Bilirubin Urine: NEGATIVE
Glucose, UA: NEGATIVE mg/dL
Ketones, ur: NEGATIVE mg/dL
Nitrite: POSITIVE — AB
Protein, ur: NEGATIVE mg/dL
Specific Gravity, Urine: 1.008 (ref 1.005–1.030)
pH: 7 (ref 5.0–8.0)

## 2015-12-03 LAB — CK TOTAL AND CKMB (NOT AT ARMC)
CK TOTAL: 43 U/L — AB (ref 49–397)
CK, MB: 2 ng/mL (ref 0.5–5.0)
CK, MB: 3.2 ng/mL (ref 0.5–5.0)
CK, MB: 1.3 ng/mL (ref 0.5–5.0)
RELATIVE INDEX: INVALID (ref 0.0–2.5)
RELATIVE INDEX: INVALID (ref 0.0–2.5)
Relative Index: INVALID (ref 0.0–2.5)
Total CK: 45 U/L — ABNORMAL LOW (ref 49–397)
Total CK: 94 U/L (ref 49–397)

## 2015-12-03 LAB — MRSA PCR SCREENING: MRSA BY PCR: NEGATIVE

## 2015-12-03 LAB — URINE MICROSCOPIC-ADD ON

## 2015-12-03 LAB — TROPONIN I
Troponin I: 0.04 ng/mL (ref <0.03)
Troponin I: 0.04 ng/mL (ref <0.03)

## 2015-12-03 LAB — BLOOD GAS, VENOUS
Acid-base deficit: 2.1 mmol/L — ABNORMAL HIGH (ref 0.0–2.0)
Bicarbonate: 22.5 mmol/L (ref 20.0–28.0)
FIO2: 21
O2 Saturation: 61.1 %
Patient temperature: 98.6
pCO2, Ven: 40.1 mmHg — ABNORMAL LOW (ref 44.0–60.0)
pH, Ven: 7.368 (ref 7.250–7.430)
pO2, Ven: 35.6 mmHg (ref 32.0–45.0)

## 2015-12-03 LAB — BASIC METABOLIC PANEL
Anion gap: 5 (ref 5–15)
BUN: 14 mg/dL (ref 6–20)
CO2: 23 mmol/L (ref 22–32)
Calcium: 9.1 mg/dL (ref 8.9–10.3)
Chloride: 111 mmol/L (ref 101–111)
Creatinine, Ser: 1.19 mg/dL (ref 0.61–1.24)
GFR calc Af Amer: >60 mL/min (ref >60)
GFR calc non Af Amer: 56 mL/min — ABNORMAL LOW (ref >60)
Glucose, Bld: 93 mg/dL (ref 65–99)
Potassium: 4 mmol/L (ref 3.5–5.1)
Sodium: 139 mmol/L (ref 135–145)

## 2015-12-03 LAB — I-STAT TROPONIN, ED: Troponin i, poc: 0.02 ng/mL (ref 0.00–0.08)

## 2015-12-03 LAB — D-DIMER, QUANTITATIVE (NOT AT ARMC): D-Dimer, Quant: 0.48 ug/mL-FEU (ref 0.00–0.50)

## 2015-12-03 LAB — I-STAT CG4 LACTIC ACID, ED: Lactic Acid, Venous: 0.86 mmol/L (ref 0.5–1.9)

## 2015-12-03 MED ORDER — DONEPEZIL HCL 10 MG PO TABS
10.0000 mg | ORAL_TABLET | Freq: Every day | ORAL | Status: DC
  Administered 2015-12-03 – 2015-12-05 (×3): 10 mg via ORAL
  Filled 2015-12-03 (×3): qty 1

## 2015-12-03 MED ORDER — LOSARTAN POTASSIUM 50 MG PO TABS
50.0000 mg | ORAL_TABLET | Freq: Every day | ORAL | Status: DC
  Administered 2015-12-03 – 2015-12-05 (×3): 50 mg via ORAL
  Filled 2015-12-03 (×3): qty 1

## 2015-12-03 MED ORDER — SPIRONOLACTONE 25 MG PO TABS
25.0000 mg | ORAL_TABLET | Freq: Every morning | ORAL | Status: DC
  Administered 2015-12-03 – 2015-12-06 (×4): 25 mg via ORAL
  Filled 2015-12-03 (×4): qty 1

## 2015-12-03 MED ORDER — ACETAMINOPHEN 650 MG RE SUPP: 650.0000 mg | Freq: Four times a day (QID) | RECTAL | Status: DC | PRN

## 2015-12-03 MED ORDER — FUROSEMIDE 10 MG/ML IJ SOLN
20.0000 mg | Freq: Once | INTRAMUSCULAR | Status: AC
  Administered 2015-12-03: 20 mg via INTRAVENOUS
  Filled 2015-12-03: qty 4

## 2015-12-03 MED ORDER — ALBUTEROL SULFATE (2.5 MG/3ML) 0.083% IN NEBU
5.0000 mg | INHALATION_SOLUTION | Freq: Once | RESPIRATORY_TRACT | Status: AC
  Administered 2015-12-03: 5 mg via RESPIRATORY_TRACT
  Filled 2015-12-03: qty 6

## 2015-12-03 MED ORDER — ENOXAPARIN SODIUM 40 MG/0.4ML ~~LOC~~ SOLN
40.0000 mg | SUBCUTANEOUS | Status: DC
  Administered 2015-12-03 – 2015-12-05 (×3): 40 mg via SUBCUTANEOUS
  Filled 2015-12-03 (×2): qty 0.4

## 2015-12-03 MED ORDER — SODIUM CHLORIDE 0.9% FLUSH
3.0000 mL | INTRAVENOUS | Status: DC | PRN
  Administered 2015-12-04: 3 mL via INTRAVENOUS
  Filled 2015-12-03: qty 3

## 2015-12-03 MED ORDER — OXYBUTYNIN CHLORIDE ER 15 MG PO TB24
15.0000 mg | ORAL_TABLET | Freq: Every day | ORAL | Status: DC
  Administered 2015-12-03 – 2015-12-06 (×4): 15 mg via ORAL
  Filled 2015-12-03 (×4): qty 1

## 2015-12-03 MED ORDER — ONDANSETRON HCL 4 MG/2ML IJ SOLN: 4.0000 mg | Freq: Four times a day (QID) | INTRAMUSCULAR | Status: DC | PRN

## 2015-12-03 MED ORDER — SODIUM CHLORIDE 0.9 % IV SOLN: 250.0000 mL | INTRAVENOUS | Status: DC | PRN

## 2015-12-03 MED ORDER — SODIUM CHLORIDE 0.9% FLUSH
3.0000 mL | Freq: Two times a day (BID) | INTRAVENOUS | Status: DC
  Administered 2015-12-03 – 2015-12-05 (×4): 3 mL via INTRAVENOUS

## 2015-12-03 MED ORDER — ASPIRIN 81 MG PO CHEW
81.0000 mg | CHEWABLE_TABLET | Freq: Every day | ORAL | Status: DC
  Administered 2015-12-03 – 2015-12-06 (×4): 81 mg via ORAL
  Filled 2015-12-03 (×4): qty 1

## 2015-12-03 MED ORDER — DEXTROSE 5 % IV SOLN
1.0000 g | Freq: Once | INTRAVENOUS | Status: AC
  Administered 2015-12-03: 1 g via INTRAVENOUS
  Filled 2015-12-03: qty 10

## 2015-12-03 MED ORDER — DEXTROMETHORPHAN POLISTIREX ER 30 MG/5ML PO SUER
10.0000 mL | ORAL | Status: DC | PRN
  Filled 2015-12-03: qty 10

## 2015-12-03 MED ORDER — IPRATROPIUM-ALBUTEROL 0.5-2.5 (3) MG/3ML IN SOLN
3.0000 mL | Freq: Once | RESPIRATORY_TRACT | Status: AC
  Administered 2015-12-03: 3 mL via RESPIRATORY_TRACT
  Filled 2015-12-03: qty 3

## 2015-12-03 MED ORDER — ONDANSETRON HCL 4 MG/2ML IJ SOLN: 4.0000 mg | Freq: Three times a day (TID) | INTRAMUSCULAR | Status: AC | PRN

## 2015-12-03 MED ORDER — FLUTICASONE FUROATE-VILANTEROL 100-25 MCG/INH IN AEPB
1.0000 | INHALATION_SPRAY | Freq: Every day | RESPIRATORY_TRACT | Status: DC
  Administered 2015-12-04 – 2015-12-06 (×3): 1 via RESPIRATORY_TRACT
  Filled 2015-12-03: qty 28

## 2015-12-03 MED ORDER — VITAMIN D3 25 MCG (1000 UNIT) PO TABS
1000.0000 [IU] | ORAL_TABLET | Freq: Every day | ORAL | Status: DC
  Administered 2015-12-03 – 2015-12-06 (×4): 1000 [IU] via ORAL
  Filled 2015-12-03 (×4): qty 1

## 2015-12-03 MED ORDER — ACETAMINOPHEN 325 MG PO TABS: 650.0000 mg | ORAL_TABLET | Freq: Four times a day (QID) | ORAL | Status: DC | PRN

## 2015-12-03 MED ORDER — IPRATROPIUM-ALBUTEROL 0.5-2.5 (3) MG/3ML IN SOLN
3.0000 mL | Freq: Once | RESPIRATORY_TRACT | Status: AC
  Administered 2015-12-03: 3 mL via RESPIRATORY_TRACT

## 2015-12-03 MED ORDER — BISMUTH SUBSALICYLATE 262 MG/15ML PO SUSP
30.0000 mL | ORAL | Status: DC | PRN
  Filled 2015-12-03: qty 118

## 2015-12-03 MED ORDER — CARVEDILOL 12.5 MG PO TABS
12.5000 mg | ORAL_TABLET | Freq: Two times a day (BID) | ORAL | Status: DC
  Administered 2015-12-03 – 2015-12-05 (×4): 12.5 mg via ORAL
  Filled 2015-12-03 (×4): qty 1

## 2015-12-03 MED ORDER — PHENYTOIN SODIUM EXTENDED 100 MG PO CAPS
200.0000 mg | ORAL_CAPSULE | Freq: Two times a day (BID) | ORAL | Status: DC
  Administered 2015-12-03 – 2015-12-06 (×7): 200 mg via ORAL
  Filled 2015-12-03 (×7): qty 2

## 2015-12-03 MED ORDER — ONDANSETRON HCL 4 MG PO TABS: 4.0000 mg | ORAL_TABLET | Freq: Four times a day (QID) | ORAL | Status: DC | PRN

## 2015-12-03 MED ORDER — ATORVASTATIN CALCIUM 10 MG PO TABS
10.0000 mg | ORAL_TABLET | Freq: Every day | ORAL | Status: DC
  Administered 2015-12-03 – 2015-12-06 (×4): 10 mg via ORAL
  Filled 2015-12-03 (×4): qty 1

## 2015-12-03 MED ORDER — FUROSEMIDE 10 MG/ML IJ SOLN
40.0000 mg | Freq: Two times a day (BID) | INTRAMUSCULAR | Status: DC
  Administered 2015-12-03 – 2015-12-05 (×4): 40 mg via INTRAVENOUS
  Filled 2015-12-03 (×4): qty 4

## 2015-12-03 MED ORDER — NITROGLYCERIN 0.3 MG SL SUBL
0.3000 mg | SUBLINGUAL_TABLET | SUBLINGUAL | Status: DC | PRN
  Filled 2015-12-03: qty 100

## 2015-12-03 MED ORDER — MIRABEGRON ER 50 MG PO TB24
50.0000 mg | ORAL_TABLET | Freq: Every day | ORAL | Status: DC
  Administered 2015-12-04 – 2015-12-06 (×2): 50 mg via ORAL
  Filled 2015-12-03 (×3): qty 1

## 2015-12-03 NOTE — ED Notes (Signed)
Kenan, RT, called to do neb tx and he completed it.

## 2015-12-03 NOTE — ED Triage Notes (Signed)
Patient comes from [facility] home. Patient complaining of SOB. It started an hour ago. Patient was 83% room air when EMS arrived. Rhonchi heard in all fields. Brown sputum. Patient has 5mg  of albuterol. Patient O2 sats 100% [place] of 6L.

## 2015-12-03 NOTE — H&P (Signed)
History and Physical    [name] [id number] DOB: 1933-03-22 DOA: 12/03/2015  PCP: Tivis Ringer, MD   Patient coming from: Assisted living  Chief Complaint: Dyspnea.  HPI: [name] is a 82 y.o. male with medical history significant of hypertension and systolic heart failure presents to the hospital with the chief complaint of worsening dyspnea. For last 7 days patient has noticed dyspnea, predominantly on exertion and improved with rest, no associated claudication or angina, denies any PND, orthopnea or lower extremity edema. This morning he experienced acute, severe dyspnea that prompted him to come to the hospital for further evaluation. The episode dyspnea was not associated with wheezing or cough, no angina. His medical regimen includes spironolactone, being cautious with salt and fluid. He ambulates with the help of a cane and he lives in assisted living.   ED Course: Start on furosemide intravenously.  Review of Systems:  1. General. No fevers or chills no waking of weight loss 2. Cardiovascular. Denies any PND, he has chronic orthopnea 2 pillows unchanged, no lower extremity edema no angina or claudication. 3. Pulmonary positive for dyspnea, no wheezing, cough or hemoptysis 4. Gastrointestinal, no nausea, vomiting or diarrhea 5. Musculoskeletal no joint pain 6. Dermatology no rashes 7. Urology no dysuria or increased urinary frequency 8. Endocrinology no tremors, heat or cold intolerance 9. Neurology no seizures or paresthesias 10. Hematology. No easy bruisability or frequent infections  Past Medical History:  Diagnosis Date  . Acute MI [facility]) Dec 2010   cardiac catheriziation and stenting of the LAD (bare metal) 03/14/09              . Anemia   . Angina   . Blood transfusion   . CAD (coronary artery disease)   . COPD (chronic obstructive pulmonary disease) ([place])   . DEMENTIA   . High cholesterol   . Hypertension   . LV dysfunction    EF 25 to 30%  per echo May 2012  . NSTEMI (non-ST elevated myocardial infarction) [facility]) Nov 2011   with LV dysfunction  . Prostatic hypertrophy   . Seizures ([place]) ~ 2006   "when blood pressure went up too high"; on Dilantin  . Urethral stricture    with subsequent trauma and has an indwelling Foley catheter in place during his first MI                                                 . Urinary catheter in place    chronic  . Vision impairment    "legally blind"    Past Surgical History:  Procedure Laterality Date  . CARDIAC CATHETERIZATION  02/2010  . 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 CHOLECYSTECTOMY  05/30/11  . CHOLECYSTECTOMY  05/30/2011   Procedure: LAPAROSCOPIC CHOLECYSTECTOMY;  Surgeon: Judieth Keens, DO;  Location: [facility];  Service: General;  Laterality: N/A;  . CORONARY ANGIOPLASTY WITH STENT PLACEMENT  03/2009   "1"; stent to LAD  . TONSILLECTOMY     "when I was small"     reports that he quit smoking about 11 years ago. His smoking use included Cigarettes. He smoked 0.00 packs per day for 50.00 years. He has never used smokeless tobacco. He reports that he does not drink alcohol or use drugs.  Allergies  Allergen Reactions  . Red Dye Other (See Comments)    Per Mar.  . Iodinated Diagnostic Agents Rash    Pt given 50 mg Benadryl prior to Omnipaque injection, tolerated well.    Family History  Problem Relation Age of Onset  . Colon cancer Neg Hx      Prior to Admission medications   Medication Sig Start Date End Date Taking? Authorizing Provider  aspirin 81 MG chewable tablet Chew 81 mg by mouth daily.   Yes Historical Provider, MD  atorvastatin (LIPITOR) 10 MG tablet Take 10 mg by mouth daily.   Yes Historical Provider, MD  bismuth subsalicylate (PEPTO BISMOL) 262 MG/15ML suspension Take 30 mLs by mouth as needed for diarrhea or loose stools.    Yes Historical Provider, MD  carvedilol (COREG) 12.5 MG tablet Take 12.5 mg by  mouth 2 (two) times daily with a meal.  05/12/11  Yes Rogelia Mire, NP  cholecalciferol (VITAMIN D) 1000 UNITS tablet Take 1,000 Units by mouth daily.   Yes Historical Provider, MD  dextromethorphan (DELSYM) 30 MG/5ML liquid Take 10 mLs by mouth as needed for cough.   Yes Historical Provider, MD  donepezil (ARICEPT) 10 MG tablet Take 10 mg by mouth at bedtime.   Yes Historical Provider, MD  Fluticasone Furoate-Vilanterol 100-25 MCG/INH AEPB Inhale 1 puff into the lungs daily with breakfast.   Yes Historical Provider, MD  hydrocortisone cream (PREPARATION H HYDROCORTISONE) 1 % Apply 1 application topically 2 (two) times daily as needed. For hemmoroids.   Yes Historical Provider, MD  losartan (COZAAR) 50 MG tablet Take 50 mg by mouth daily.   Yes Historical Provider, MD  mirabegron ER (MYRBETRIQ) 50 MG TB24 tablet Take 50 mg by mouth daily with breakfast.   Yes Historical Provider, MD  nitroGLYCERIN (NITROSTAT) 0.3 MG SL tablet Place 0.3 mg under the tongue every 5 (five) minutes as needed for chest pain.   Yes Historical Provider, MD  oxybutynin (DITROPAN XL) 15 MG 24 hr tablet Take 15 mg by mouth daily.     Yes Historical Provider, MD  phenytoin (DILANTIN) 100 MG ER capsule Take 200 mg by mouth 2 (two) times daily.   Yes Historical Provider, MD  spironolactone (ALDACTONE) 25 MG tablet Take 25 mg by mouth every morning.   Yes Historical Provider, MD    Physical Exam: Vitals:   12/03/15 0806 12/03/15 0949 12/03/15 1030 12/03/15 1114  BP: 105/75 142/89 148/92 (!) 149/82  Pulse: 80 76 73 79  Resp: 20 16 18 18   Temp:    98.3 F (36.8 C)  TempSrc:    Oral  SpO2: 92% 95% 95% 94%  Weight:    67.9 kg (149 lb 11.2 oz)  Height:    5\' 8"  (1.727 m)      Constitutional: NAD, calm, comfortable Vitals:   12/03/15 0806 12/03/15 0949 12/03/15 1030 12/03/15 1114  BP: 105/75 142/89 148/92 (!) 149/82  Pulse: 80 76 73 79  Resp: 20 16 18 18   Temp:    98.3 F (36.8 C)  TempSrc:    Oral  SpO2: 92%  95% 95% 94%  Weight:    67.9 kg (149 lb 11.2 oz)  Height:    5\' 8"  (1.727 m)   Eyes: PERRL, lids and conjunctivae normal, no pallor or icterus. ENMT: Mucous membranes are moist. Posterior pharynx clear of any exudate or lesions.Normal dentition.  Neck: normal, supple, no masses, no thyromegaly, positive for moderate jugular venous distention Respiratory:  no wheezing. Normal respiratory effort. No accessory muscle use. Decreased breath sounds at bases, bibasilar rales. No rhonchi. Cardiovascular: Regular rate and rhythm, no murmurs / rubs / gallops. No extremity edema. 2+ pedal pulses. No carotid bruits.  Abdomen: no tenderness, no masses palpated. No hepatosplenomegaly. Bowel sounds positive.  Musculoskeletal: no clubbing / cyanosis. No joint deformity upper and lower extremities. Good ROM, no contractures. Normal muscle tone.  Skin: no rashes, lesions, ulcers. No induration Neurologic: CN 2-12 grossly intact. Sensation intact, DTR normal. Strength 5/5 in all 4.      Labs on Admission: I have personally reviewed following labs and imaging studies  CBC:  Recent Labs Lab 12/03/15 0701  WBC 9.2  NEUTROABS 6.7  HGB 13.1  HCT 39.9  MCV 91.3  PLT 123XX123   Basic Metabolic Panel:  Recent Labs Lab 12/03/15 0701  NA 139  K 4.0  CL 111  CO2 23  GLUCOSE 93  BUN 14  CREATININE 1.19  CALCIUM 9.1   GFR: Estimated Creatinine Clearance: 47.5 mL/min (by C-G formula based on SCr of 1.19 mg/dL). Liver Function Tests: No results for input(s): AST, ALT, ALKPHOS, BILITOT, PROT, ALBUMIN in the last 168 hours. No results for input(s): LIPASE, AMYLASE in the last 168 hours. No results for input(s): AMMONIA in the last 168 hours. Coagulation Profile: No results for input(s): INR, PROTIME in the last 168 hours. Cardiac Enzymes: No results for input(s): CKTOTAL, CKMB, CKMBINDEX, TROPONINI in the last 168 hours. BNP (last 3 results) No results for input(s): PROBNP in the last 8760  hours. HbA1C: No results for input(s): HGBA1C in the last 72 hours. CBG: No results for input(s): GLUCAP in the last 168 hours. Lipid Profile: No results for input(s): CHOL, HDL, LDLCALC, TRIG, CHOLHDL, LDLDIRECT in the last 72 hours. Thyroid Function Tests: No results for input(s): TSH, T4TOTAL, FREET4, T3FREE, THYROIDAB in the last 72 hours. Anemia Panel: No results for input(s): VITAMINB12, FOLATE, FERRITIN, TIBC, IRON, RETICCTPCT in the last 72 hours. Urine analysis:    Component Value Date/Time   COLORURINE YELLOW 12/03/2015 0744   APPEARANCEUR CLOUDY (A) 12/03/2015 0744   LABSPEC 1.008 12/03/2015 0744   PHURINE 7.0 12/03/2015 0744   GLUCOSEU NEGATIVE 12/03/2015 0744   HGBUR SMALL (A) 12/03/2015 0744   BILIRUBINUR NEGATIVE 12/03/2015 0744   KETONESUR NEGATIVE 12/03/2015 0744   PROTEINUR NEGATIVE 12/03/2015 0744   UROBILINOGEN 0.2 08/08/2014 1109   NITRITE POSITIVE (A) 12/03/2015 0744   LEUKOCYTESUR LARGE (A) 12/03/2015 0744   Sepsis Labs: !!!!!!!!!!!!!!!!!!!!!!!!!!!!!!!!!!!!!!!!!!!! @LABRCNTIP (procalcitonin:4,lacticidven:4) )No results found for this or any previous visit (from the past 240 hour(s)).   Radiological Exams on Admission: Dg Chest 2 View  Result Date: 12/03/2015 CLINICAL DATA:  Worsening dyspnea and hypoxia. EXAM: CHEST  2 VIEW COMPARISON:  05/11/2011 FINDINGS: There is moderate vascular and interstitial congestion, new. There is a small right pleural effusion. There is moderate cardiomegaly. No focal confluent airspace consolidation. IMPRESSION: Vascular and interstitial changes suggesting mild congestive heart failure. Small right pleural effusion. Electronically Signed   By: Andreas Newport M.D.   On: 12/03/2015 06:49    EKG: Independently reviewed. Heart rate about 100 bpm with sinus rhythm,  first-degree AV block, normal axis, poor R-wave progression V1 to V3, seventh beat is a premature ventricular complex  Chest film personally reviewed is an AP cell  which is rotated to the right side, has increased interstitial markings bilaterally with increased fluid in the right fissure, small right pleural effusion.  Assessment/Plan Active Problems:  Hypoxia   CHF (congestive heart failure) ([place])   This is a 82 year old gentleman who is known to have ischemic cardiomyopathy with ejection fraction 25-30%, he is on aldactone, beta blockade and ACE inhibitor. Patient presents with dyspnea for last 7 days that had acutely worsened today, denies any PND or orthopnea. He assures being careful with salt and fluid intake. He lives in assisted living and ambulates with help of a cane. On his initial physical examination his blood pressure was 158/85, heart rate 86, respiratory rate 15, oxygen saturation 98% on 6 L/ min of supplemental oxygen per nasal cannula. He does have moderate jugular venous distention, decreased breath sounds at bases bilaterally with bibasilar rales. No significant lower extremity edema or hepatomegaly. His sodium was 134, potassium 4.0, BUN 14, creatinine 1.19, glucose 93, white count 9.2, hemoglobin 13.1, hematocrit and 39.9, platelet count 251. D-dimer 0.48.  Arterial blood gas on 21% FiO2, pH 7.36, P CO2 40.1, PaO2 35.6, oxygen saturation at 61.1 Chest x-ray suggestive of pulmonary edema. EKG on sinus rhythm, first-degree AV block, poor R-wave progression on the precordial leads, no ST elevations or ST depressions no significant T-wave abnormalities. Urinalysis with positive nitrates, 6-30 white cells, 0-5 squamous epithelial cells, large leukocytes.   Working diagnosis. Hypoxic respiratory failure due to cardiogenic pulmonary edema due to systolic heart failure decompensation.  1. Hypoxic respiratory failure. Significant hypoxemia with PaO2/ FiO2 ratio down to 169. We'll continue diuresis with intravenous furosemide, targeting negative fluid balance. Continue oximetry monitoring and supplemental oxygen per nasal cannula to target O2  saturation more than 92%. We'll follow-up chest x-ray in the morning after diuresis.   2. Decompensated systolic heart failure. Old records personally review, echocardiogram from 2012 showing ejection fraction 25-30 %, with wall motion abnormalities including akinesis of the mid distal anteroseptal and apical myocardium. We'll continue diuresis with furosemide 40 mg intravenously twice daily, strict in and out's and daily weights. We'll continue beta blockade with carvedilol, ARB with losartan, with a close follow-up on blood pressure. Continue Aldactone. As part of initial workup will rule out ischemic event with serial cardiac enzymes and EKG in the morning. For ischemic cardiomyopathy will continue aspirin and atorvastatin.  3. COPD. Continue submental oxygen per nasal cannula, continue bronchodilator therapy with DuoNeb's. Continue inhaled corticosteroids with fluticasone and long acting b agonist with vilaneterol.   4. Abnormal urinalysis. Patient is asymptomatic, he does have 0-5, cells, patient has received ceftriaxone in the emergency department. Will follow-up on urinary culture. For now continue to hold antibiotics.  5. Seizures. Continue phenytoin.  6. Dementia. Continue donepezil.  Patient continued to be high risk of developing worsening hypoxic respiratory failure.    DVT prophylaxis: lovenox Code Status: full Family Communication: No family at bedside  Disposition Plan: Assisted living. Consults called: None Admission status: Inpatient.   Mauricio Gerome Apley MD [name] Pager [phone]  If 7PM-7AM, please contact night-coverage [email or web address] Password [email or web address]  12/03/2015, 12:16 PM

## 2015-12-03 NOTE — ED Notes (Signed)
Patient's leg bag changed over to a large bag prior to Lasix admin.  Pre-lasix, 400 ml out in leg bag.

## 2015-12-03 NOTE — ED Provider Notes (Signed)
[facility] DEPT Provider Note   CSN: [id number] Arrival date & time: 12/03/15  0537     History   Chief Complaint Chief Complaint  Patient presents with  . Shortness of Breath    HPI [name] is a 82 y.o. male with history of CAD, COPD who presents with shortness of breath and cough. Patient states that she has had worsening shortness of breath for the past week. Patient states his shortness of breath is worse on exertion. Patient states he has a mild productive cough. Patient has had associated lower chest tightness and pleuritic chest pain, which is now resolved. Patient took nitroglycerin this morning she feels helps some and was given albuterol en route. Patient denies home oxygen or prednisone use. Patient denies any fevers, abdominal pain, nausea, vomiting, leg pain or swelling. Patient states she does have dysuria, however, he has an indwelling catheter and this is occasionally normal for him. He is not on oxygen at home and he not been taking prednisone recently.  HPI  Past Medical History:  Diagnosis Date  . Acute MI [facility]) Dec 2010   cardiac catheriziation and stenting of the LAD (bare metal) 03/14/09              . Anemia   . Angina   . Blood transfusion   . CAD (coronary artery disease)   . COPD (chronic obstructive pulmonary disease) ([place])   . DEMENTIA   . High cholesterol   . Hypertension   . LV dysfunction    EF 25 to 30% per echo May 2012  . NSTEMI (non-ST elevated myocardial infarction) [facility]) Nov 2011   with LV dysfunction  . Prostatic hypertrophy   . Seizures ([place]) ~ 2006   "when blood pressure went up too high"; on Dilantin  . Urethral stricture    with subsequent trauma and has an indwelling Foley catheter in place during his first MI                                                 . Urinary catheter in place    chronic  . Vision impairment    "legally blind"    Patient Active Problem List   Diagnosis Date Noted  . Hypoxia 12/03/2015  .  CHF (congestive heart failure) ([place]) 12/03/2015  . Rectal bleeding 07/01/2015  . UTI (lower urinary tract infection) 03/07/2012  . Lower GI bleed = suspect diverticular 03/06/2012  . Chest pain, unspecified 05/11/2011  . Preop cardiovascular exam 12/07/2010  . CAD (coronary artery disease) 08/24/2010  . Hypertension   . Seizure disorder ([place])   . Anemia   . LV dysfunction   . NSTEMI (non-ST elevated myocardial infarction) [facility])     Past Surgical History:  Procedure Laterality Date  . CARDIAC CATHETERIZATION  02/2010  . 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 CHOLECYSTECTOMY  05/30/11  . CHOLECYSTECTOMY  05/30/2011   Procedure: LAPAROSCOPIC CHOLECYSTECTOMY;  Surgeon: Judieth Keens, DO;  Location: [facility];  Service: General;  Laterality: N/A;  . CORONARY ANGIOPLASTY WITH STENT PLACEMENT  03/2009   "1"; stent to LAD  . TONSILLECTOMY     "when I was small"       Home Medications    Prior to Admission medications   Medication Sig  Start Date End Date Taking? Authorizing Provider  aspirin 81 MG chewable tablet Chew 81 mg by mouth daily.   Yes Historical Provider, MD  atorvastatin (LIPITOR) 10 MG tablet Take 10 mg by mouth daily.   Yes Historical Provider, MD  bismuth subsalicylate (PEPTO BISMOL) 262 MG/15ML suspension Take 30 mLs by mouth as needed for diarrhea or loose stools.    Yes Historical Provider, MD  carvedilol (COREG) 12.5 MG tablet Take 12.5 mg by mouth 2 (two) times daily with a meal.  05/12/11  Yes Rogelia Mire, NP  cholecalciferol (VITAMIN D) 1000 UNITS tablet Take 1,000 Units by mouth daily.   Yes Historical Provider, MD  dextromethorphan (DELSYM) 30 MG/5ML liquid Take 10 mLs by mouth as needed for cough.   Yes Historical Provider, MD  donepezil (ARICEPT) 10 MG tablet Take 10 mg by mouth at bedtime.   Yes Historical Provider, MD  Fluticasone Furoate-Vilanterol 100-25 MCG/INH AEPB Inhale 1 puff into the lungs daily with  breakfast.   Yes Historical Provider, MD  hydrocortisone cream (PREPARATION H HYDROCORTISONE) 1 % Apply 1 application topically 2 (two) times daily as needed. For hemmoroids.   Yes Historical Provider, MD  losartan (COZAAR) 50 MG tablet Take 50 mg by mouth daily.   Yes Historical Provider, MD  mirabegron ER (MYRBETRIQ) 50 MG TB24 tablet Take 50 mg by mouth daily with breakfast.   Yes Historical Provider, MD  nitroGLYCERIN (NITROSTAT) 0.3 MG SL tablet Place 0.3 mg under the tongue every 5 (five) minutes as needed for chest pain.   Yes Historical Provider, MD  oxybutynin (DITROPAN XL) 15 MG 24 hr tablet Take 15 mg by mouth daily.     Yes Historical Provider, MD  phenytoin (DILANTIN) 100 MG ER capsule Take 200 mg by mouth 2 (two) times daily.   Yes Historical Provider, MD  spironolactone (ALDACTONE) 25 MG tablet Take 25 mg by mouth every morning.   Yes Historical Provider, MD    Family History Family History  Problem Relation Age of Onset  . Colon cancer Neg Hx     Social History Social History  Substance Use Topics  . Smoking status: Former Smoker    Packs/day: 0.00    Years: 50.00    Types: Cigarettes    Quit date: 08/22/2004  . Smokeless tobacco: Never Used  . Alcohol use No     Comment: "quit drinking 1980's"     Allergies   Red dye and Iodinated diagnostic agents   Review of Systems Review of Systems  Constitutional: Negative for chills and fever.  HENT: Negative for facial swelling and sore throat.   Respiratory: Positive for cough and shortness of breath.   Cardiovascular: Positive for chest pain. Negative for leg swelling.  Gastrointestinal: Negative for abdominal pain, nausea and vomiting.  Genitourinary: Positive for dysuria.  Musculoskeletal: Negative for back pain.  Skin: Negative for rash and wound.  Neurological: Negative for headaches.  Psychiatric/Behavioral: The patient is not nervous/anxious.      Physical Exam Updated Vital Signs BP 119/68 (BP  Location: Left Arm)   Pulse 81   Temp 98 F (36.7 C) (Oral)   Resp 18   Ht 5\' 8"  (1.727 m)   Wt 67.9 kg   SpO2 96%   BMI 22.76 kg/m   Physical Exam  Constitutional: He appears well-developed and well-nourished. No distress.  HENT:  Head: Normocephalic and atraumatic.  Mouth/Throat: Oropharynx is clear and moist. No oropharyngeal exudate.  Eyes: Conjunctivae are normal. Pupils are  equal, round, and reactive to light. Right eye exhibits no discharge. Left eye exhibits no discharge. No scleral icterus.  Neck: Normal range of motion. Neck supple. No thyromegaly present.  Cardiovascular: Normal rate, regular rhythm, normal heart sounds and intact distal pulses.  Exam reveals no gallop and no friction rub.   No murmur heard. Pulmonary/Chest: Effort normal. No stridor. No respiratory distress. He has no wheezes. He has rhonchi (diffuse). He has no rales.  Abdominal: Soft. Bowel sounds are normal. He exhibits no distension. There is no tenderness. There is no rebound and no guarding.  Musculoskeletal: He exhibits no edema.  No calf TTP  Lymphadenopathy:    He has no cervical adenopathy.  Neurological: He is alert. Coordination normal.  Skin: Skin is warm and dry. No rash noted. He is not diaphoretic. No pallor.  Psychiatric: He has a normal mood and affect.  Nursing note and vitals reviewed.    ED Treatments / Results  Labs (all labs ordered are listed, but only abnormal results are displayed) Labs Reviewed  BASIC METABOLIC PANEL - Abnormal; Notable for the following:       Result Value   GFR calc non Af Amer 56 (*)    All other components within normal limits  CBC WITH DIFFERENTIAL/PLATELET - Abnormal; Notable for the following:    Monocytes Absolute 1.2 (*)    All other components within normal limits  URINALYSIS, ROUTINE W REFLEX MICROSCOPIC (NOT AT [facility]) - Abnormal; Notable for the following:    APPearance CLOUDY (*)    Hgb urine dipstick SMALL (*)    Nitrite POSITIVE (*)      Leukocytes, UA LARGE (*)    All other components within normal limits  BRAIN NATRIURETIC PEPTIDE - Abnormal; Notable for the following:    B Natriuretic Peptide 787.8 (*)    All other components within normal limits  BLOOD GAS, VENOUS - Abnormal; Notable for the following:    pCO2, Ven 40.1 (*)    Acid-base deficit 2.1 (*)    All other components within normal limits  URINE MICROSCOPIC-ADD ON - Abnormal; Notable for the following:    Squamous Epithelial / LPF 0-5 (*)    Bacteria, UA MANY (*)    All other components within normal limits  TROPONIN I - Abnormal; Notable for the following:    Troponin I 0.04 (*)    All other components within normal limits  MRSA PCR SCREENING  CULTURE, BLOOD (ROUTINE X 2)  CULTURE, BLOOD (ROUTINE X 2)  URINE CULTURE  D-DIMER, QUANTITATIVE (NOT AT [facility])  TROPONIN I  TROPONIN I  CK TOTAL AND CKMB (NOT AT [facility])  CK TOTAL AND CKMB (NOT AT [facility])  CK TOTAL AND CKMB (NOT AT [facility])  I-STAT CG4 LACTIC ACID, ED  Randolm Idol, ED    EKG  EKG Interpretation  Date/Time:  Friday December 03 2015 05:39:02 EDT Ventricular Rate:  89 PR Interval:    QRS Duration: 112 QT Interval:  350 QTC Calculation: 426 R Axis:   26 Text Interpretation:  Sinus rhythm Ventricular premature complex Prolonged PR interval Probable anteroseptal infarct, old Nonspecific T abnormalities, lateral leads Confirmed by Hazle Coca [phone]) on 12/03/2015 7:11:03 AM Also confirmed by Hazle Coca [phone]), editor [place] CT, [place] [phone])  on 12/03/2015 8:49:05 AM       Radiology Dg Chest 2 View  Result Date: 12/03/2015 CLINICAL DATA:  Worsening dyspnea and hypoxia. EXAM: CHEST  2 VIEW COMPARISON:  05/11/2011 FINDINGS: There is moderate vascular and  interstitial congestion, new. There is a small right pleural effusion. There is moderate cardiomegaly. No focal confluent airspace consolidation. IMPRESSION: Vascular and interstitial changes suggesting mild congestive heart failure. Small right  pleural effusion. Electronically Signed   By: Andreas Newport M.D.   On: 12/03/2015 06:49    Procedures Procedures (including critical care time)  Medications Ordered in ED Medications  ondansetron (ZOFRAN) injection 4 mg (not administered)  aspirin chewable tablet 81 mg (81 mg Oral Given 12/03/15 1446)  dextromethorphan (DELSYM) 30 MG/5ML liquid 60 mg (not administered)  fluticasone furoate-vilanterol (BREO ELLIPTA) 100-25 MCG/INH 1 puff (not administered)  mirabegron ER (MYRBETRIQ) tablet 50 mg (not administered)  nitroGLYCERIN (NITROSTAT) SL tablet 0.3 mg (not administered)  bismuth subsalicylate (PEPTO BISMOL) 262 MG/15ML suspension 30 mL (not administered)  donepezil (ARICEPT) tablet 10 mg (not administered)  spironolactone (ALDACTONE) tablet 25 mg (25 mg Oral Given 12/03/15 1446)  atorvastatin (LIPITOR) tablet 10 mg (10 mg Oral Given 12/03/15 1446)  cholecalciferol (VITAMIN D) tablet 1,000 Units (1,000 Units Oral Given 12/03/15 1446)  losartan (COZAAR) tablet 50 mg (50 mg Oral Given 12/03/15 1446)  phenytoin (DILANTIN) ER capsule 200 mg (200 mg Oral Given 12/03/15 1445)  carvedilol (COREG) tablet 12.5 mg (not administered)  oxybutynin (DITROPAN XL) 24 hr tablet 15 mg (15 mg Oral Given 12/03/15 1446)  enoxaparin (LOVENOX) injection 40 mg (not administered)  sodium chloride flush (NS) 0.9 % injection 3 mL (3 mLs Intravenous Given 12/03/15 1446)  sodium chloride flush (NS) 0.9 % injection 3 mL (not administered)  0.9 %  sodium chloride infusion (not administered)  acetaminophen (TYLENOL) tablet 650 mg (not administered)    Or  acetaminophen (TYLENOL) suppository 650 mg (not administered)  ondansetron (ZOFRAN) tablet 4 mg (not administered)    Or  ondansetron (ZOFRAN) injection 4 mg (not administered)  furosemide (LASIX) injection 40 mg (not administered)  albuterol (PROVENTIL) (2.5 MG/3ML) 0.083% nebulizer solution 5 mg (5 mg Nebulization Given 12/03/15 0605)  ipratropium-albuterol (DUONEB)  0.5-2.5 (3) MG/3ML nebulizer solution 3 mL (3 mLs Nebulization Given 12/03/15 0737)  ipratropium-albuterol (DUONEB) 0.5-2.5 (3) MG/3ML nebulizer solution 3 mL (3 mLs Nebulization Given 12/03/15 0816)  cefTRIAXone (ROCEPHIN) 1 g in dextrose 5 % 50 mL IVPB (0 g Intravenous Stopped 12/03/15 1046)  furosemide (LASIX) injection 20 mg (20 mg Intravenous Given 12/03/15 0952)     Initial Impression / Assessment and Plan / ED Course  I have reviewed the triage vital signs and the nursing notes.  Pertinent labs & imaging results that were available during my care of the patient were reviewed by me and considered in my medical decision making (see chart for details).  Clinical Course   Patient's only saturating 94% on 5 L after 1 albuterol neb and 2 DuoNebs.  CBC, BMP unremarkable. D-dimer 0.48. BNP 787.8. ABG shows CO2 40.1, as a base deficit 2.1. Troponin 0.02. Lactate 0.86. UA shows large leukocytes, positive nitrates, many bacteria. Rocephin initiated in ED. CXR shows vascular and interstitial changes suggesting mild CHF; small right pleural effusion. Lasix 20 mg initiated in ED. I consulted Dr. Cathlean Sauer with [name] who will admit the patient for hypoxia and possible acute CHF. Patient also evaluated by Dr. Ralene Bathe who guided the patient's management.   Final Clinical Impressions(s) / ED Diagnoses   Final diagnoses:  Hypoxia    New Prescriptions Current Discharge Medication List       Frederica Kuster, PA-C 12/03/15 [place], MD 12/05/15 [phone]

## 2015-12-03 NOTE — Progress Notes (Signed)
CRITICAL VALUE ALERT  Critical value received: Troponin 0.04  Date of notification: 12/03/15  Time of notification: 1512  Critical value read back: yes  Nurse who received alert:  Stanton Kidney Zebulon Gantt  MD notified (1st page): Dr. Cathlean Sauer  Time of first page:  1514  MD notified (2nd page):   Time of second page:  Responding MD:   Time MD responded:

## 2015-12-03 NOTE — ED Notes (Signed)
Bed: WA03 Expected date:  Expected time:  Means of arrival:  Comments: EMS shortness of breath / low sats on room air

## 2015-12-04 ENCOUNTER — Inpatient Hospital Stay (HOSPITAL_COMMUNITY): Payer: Medicare Other

## 2015-12-04 DIAGNOSIS — R0600 Dyspnea, unspecified: Secondary | ICD-10-CM

## 2015-12-04 DIAGNOSIS — R0902 Hypoxemia: Secondary | ICD-10-CM

## 2015-12-04 DIAGNOSIS — I5023 Acute on chronic systolic (congestive) heart failure: Secondary | ICD-10-CM

## 2015-12-04 LAB — TROPONIN I: TROPONIN I: 0.04 ng/mL — AB (ref <0.03)

## 2015-12-04 LAB — CBC
HEMATOCRIT: 37.8 % — AB (ref 39.0–52.0)
HEMOGLOBIN: 13.1 g/dL (ref 13.0–17.0)
MCH: 30.5 pg (ref 26.0–34.0)
MCHC: 34.7 g/dL (ref 30.0–36.0)
MCV: 88.1 fL (ref 78.0–100.0)
Platelets: 229 10*3/uL (ref 150–400)
RBC: 4.29 MIL/uL (ref 4.22–5.81)
RDW: 13 % (ref 11.5–15.5)
WBC: 9.5 10*3/uL (ref 4.0–10.5)

## 2015-12-04 LAB — COMPREHENSIVE METABOLIC PANEL
ALBUMIN: 3.6 g/dL (ref 3.5–5.0)
ALK PHOS: 76 U/L (ref 38–126)
ALT: 14 U/L — ABNORMAL LOW (ref 17–63)
ANION GAP: 4 — AB (ref 5–15)
AST: 16 U/L (ref 15–41)
BILIRUBIN TOTAL: 0.4 mg/dL (ref 0.3–1.2)
BUN: 18 mg/dL (ref 6–20)
CALCIUM: 9 mg/dL (ref 8.9–10.3)
CO2: 26 mmol/L (ref 22–32)
Chloride: 108 mmol/L (ref 101–111)
Creatinine, Ser: 1.3 mg/dL — ABNORMAL HIGH (ref 0.61–1.24)
GFR calc non Af Amer: 50 mL/min — ABNORMAL LOW (ref >60)
GFR, EST AFRICAN AMERICAN: 58 mL/min — AB (ref >60)
Glucose, Bld: 95 mg/dL (ref 65–99)
POTASSIUM: 3.9 mmol/L (ref 3.5–5.1)
Sodium: 138 mmol/L (ref 135–145)
TOTAL PROTEIN: 7 g/dL (ref 6.5–8.1)

## 2015-12-04 LAB — ECHOCARDIOGRAM COMPLETE
HEIGHTINCHES: 68 in
WEIGHTICAEL: 2340.8 [oz_av]

## 2015-12-04 MED ORDER — PERFLUTREN LIPID MICROSPHERE
1.0000 mL | INTRAVENOUS | Status: AC | PRN
  Administered 2015-12-04: 1 mL via INTRAVENOUS
  Filled 2015-12-04: qty 10

## 2015-12-04 NOTE — Clinical Social Work Note (Signed)
Clinical Social Work Assessment  Patient Details  Name: [name] MRN: [id number] Date of Birth: 07/23/1932  Date of referral:  12/04/15               Reason for consult:  Discharge Planning                Permission sought to share information with:  Facility Art therapist granted to share information::  Yes, Verbal Permission Granted  Name::        Agency::     Relationship::     Contact Information:     Housing/Transportation Living arrangements for the past 2 months:  [place] of Information:  Power of Attorney (RelativeInez Larsen) Patient Interpreter Needed:  None Criminal Activity/Legal Involvement Pertinent to Current Situation/Hospitalization:    Significant Relationships:  Other(Comment) (Relative- [name]) Lives with:  Self Do you feel safe going back to the place where you live?  Yes Need for family participation in patient care:  Yes (Comment)  Care giving concerns:  CSW received consult regarding patients discharge plans.    Social Worker assessment / plan: CSW spoke with patients POA, [name], via phone regarding discharge plans. Patient is from [facility] assisted living community. Patients POA would like for him to return to facility. Patients POA is happy with [facility] and was currently on her way to visit patient in the hospital.   Employment status:  Retired Forensic scientist:  Managed Medicare PT Recommendations:  Not assessed at this time Information / Referral to community resources:  Other (Comment Required) (assisted living)  Patient/Family's Response to care:  Patients POA appreciated CSW.    Patient/Family's Understanding of and Emotional Response to Diagnosis, Current Treatment, and Prognosis:  Patients POA understood current treatment and prognosis. Patients POA would like patient to return to [facility] ALF.   Emotional Assessment Appearance:  Appears stated age Attitude/Demeanor/Rapport:    Affect  (typically observed):  Unable to Assess Orientation:  Oriented to Self, Oriented to Place, Oriented to  Time, Oriented to Situation Alcohol / Substance use:    Psych involvement (Current and /or in the community):  No (Comment)  Discharge Needs  Concerns to be addressed:  No discharge needs identified Readmission within the last 30 days:  No Current discharge risk:  None Barriers to Discharge:  No Barriers Identified   Weston Anna, LCSW 12/04/2015, 2:35 PM

## 2015-12-04 NOTE — Progress Notes (Signed)
  Echocardiogram 2D Echocardiogram with Definity has been performed.  Darlina Sicilian M 12/04/2015, 9:24 AM

## 2015-12-04 NOTE — Progress Notes (Deleted)
Pt has BM and small amount of blood noted in the tubing of the catheter.SRP, RN

## 2015-12-04 NOTE — NC FL2 (Signed)
[place] LEVEL OF CARE SCREENING TOOL     IDENTIFICATION  Patient Name: [name] Birthdate: Oct 11, 1932 Sex: male Admission Date (Current Location): 12/03/2015  [facility] and [place] Number:  Herbalist and Address:  [facility],  [place] [address], [place]      Provider Number: [id number]  Attending Physician Name and Address:  Albertine Patricia, MD  Relative Name and Phone Number:       Current Level of Care: Hospital Recommended Level of Care: [place] Prior Approval Number:    Date Approved/Denied:   PASRR Number:    Discharge Plan:  (ALF)    Current Diagnoses: Patient Active Problem List   Diagnosis Date Noted  . Hypoxia 12/03/2015  . CHF (congestive heart failure) ([place]) 12/03/2015  . Rectal bleeding 07/01/2015  . UTI (lower urinary tract infection) 03/07/2012  . Lower GI bleed = suspect diverticular 03/06/2012  . Chest pain, unspecified 05/11/2011  . Preop cardiovascular exam 12/07/2010  . CAD (coronary artery disease) 08/24/2010  . Hypertension   . Seizure disorder ([place])   . Anemia   . LV dysfunction   . NSTEMI (non-ST elevated myocardial infarction) ([place])     Orientation RESPIRATION BLADDER Height & Weight     Self, Time, Situation, Place  O2 (2 liters) Continent Weight: 146 lb 4.8 oz (66.4 kg) Height:  5\' 8"  (172.7 cm)  BEHAVIORAL SYMPTOMS/MOOD NEUROLOGICAL BOWEL NUTRITION STATUS      Continent Diet (Heart Healthy)  AMBULATORY STATUS COMMUNICATION OF NEEDS Skin   Limited Assist Verbally Normal                       Personal Care Assistance Level of Assistance  Bathing, Dressing Bathing Assistance: Limited assistance   Dressing Assistance: Limited assistance     Functional Limitations Info             SPECIAL CARE FACTORS FREQUENCY                       Contractures      Additional Factors Info  Code Status Code Status Info: Full Code              Current Medications (12/04/2015):  This is the current hospital active medication list Current Facility-Administered Medications  Medication Dose Route Frequency Provider Last Rate Last Dose  . 0.9 %  sodium chloride infusion  250 mL Intravenous PRN Tawni Millers, MD      . acetaminophen (TYLENOL) tablet 650 mg  650 mg Oral Q6H PRN Tawni Millers, MD       Or  . acetaminophen (TYLENOL) suppository 650 mg  650 mg Rectal Q6H PRN Tawni Millers, MD      . aspirin chewable tablet 81 mg  81 mg Oral Daily Tawni Millers, MD   81 mg at 12/04/15 1002  . atorvastatin (LIPITOR) tablet 10 mg  10 mg Oral Daily Mauricio Gerome Apley, MD   10 mg at 12/04/15 1002  . bismuth subsalicylate (PEPTO BISMOL) 262 MG/15ML suspension 30 mL  30 mL Oral PRN Tawni Millers, MD      . carvedilol (COREG) tablet 12.5 mg  12.5 mg Oral BID WC Tawni Millers, MD   12.5 mg at 12/04/15 1002  . cholecalciferol (VITAMIN D) tablet 1,000 Units  1,000 Units Oral Daily Mauricio Gerome Apley, MD   1,000 Units at 12/04/15 1002  .  dextromethorphan (DELSYM) 30 MG/5ML liquid 60 mg  10 mL Oral PRN Tawni Millers, MD      . donepezil (ARICEPT) tablet 10 mg  10 mg Oral QHS Tawni Millers, MD   10 mg at 12/03/15 2137  . enoxaparin (LOVENOX) injection 40 mg  40 mg Subcutaneous Q24H Tawni Millers, MD   40 mg at 12/03/15 1838  . fluticasone furoate-vilanterol (BREO ELLIPTA) 100-25 MCG/INH 1 puff  1 puff Inhalation Q breakfast Mauricio Gerome Apley, MD   1 puff at 12/04/15 0844  . furosemide (LASIX) injection 40 mg  40 mg Intravenous Q12H Tawni Millers, MD   40 mg at 12/04/15 1001  . losartan (COZAAR) tablet 50 mg  50 mg Oral Daily Mauricio Gerome Apley, MD   50 mg at 12/04/15 1002  . mirabegron ER (MYRBETRIQ) tablet 50 mg  50 mg Oral Q breakfast Tawni Millers, MD   50 mg at 12/04/15 1002  . nitroGLYCERIN (NITROSTAT) SL tablet 0.3 mg  0.3 mg Sublingual  Q5 min PRN Tawni Millers, MD      . ondansetron [facility]) tablet 4 mg  4 mg Oral Q6H PRN Tawni Millers, MD       Or  . ondansetron [facility]) injection 4 mg  4 mg Intravenous Q6H PRN Tawni Millers, MD      . oxybutynin (DITROPAN XL) 24 hr tablet 15 mg  15 mg Oral Daily Mauricio Gerome Apley, MD   15 mg at 12/04/15 1002  . phenytoin (DILANTIN) ER capsule 200 mg  200 mg Oral BID Tawni Millers, MD   200 mg at 12/04/15 1002  . sodium chloride flush (NS) 0.9 % injection 3 mL  3 mL Intravenous Q12H Tawni Millers, MD   3 mL at 12/03/15 2137  . sodium chloride flush (NS) 0.9 % injection 3 mL  3 mL Intravenous PRN Tawni Millers, MD   3 mL at 12/04/15 1003  . spironolactone (ALDACTONE) tablet 25 mg  25 mg Oral q morning - 10a Mauricio Gerome Apley, MD   25 mg at 12/04/15 1002     Discharge Medications: Please see discharge summary for a list of discharge medications.  Relevant Imaging Results:  Relevant Lab Results:   Additional Information SS#: [id number]  Weston Anna, LCSW

## 2015-12-04 NOTE — Progress Notes (Signed)
PROGRESS NOTE                                                                                                                                                                                                             Patient Demographics:    [name], is a 82 y.o. male, DOB - 08-25-1932, [id number]  Admit date - 12/03/2015   Admitting Physician Mauricio Gerome Apley, MD  Outpatient Primary MD for the patient is Tivis Ringer, MD  LOS - 1   Chief Complaint  Patient presents with  . Shortness of Breath       Brief Narrative  82 y.o. male with medical history significant of hypertension and systolic heart failure presents to the hospital with the chief complaint of worsening dyspnea, Workup significant for acute on chronic systolic CHF.   Subjective:    [name] today has, No headache, No chest pain, No abdominal pain - No Nausea, Denies cough, still reports dyspnea.  Assessment  & Plan :    Active Problems:   Hypoxia   CHF (congestive heart failure) (HCC)  Acute hypoxic respiratory failure - This is secondary to acute on chronic systolic CHF, chest x-ray with evidence of volume overload, wean oxygen as tolerated - Repeat x-ray this a.m. after diuresis with persistent light lung infiltrate, no clinical evidence of pneumonia, afebrile, no leukocytosis, continue to monitor  Acute on chronic systolic CHF - Most recent echo in 2012 showing EF 25-30%, repeat 2-D echo pending - Continue with Coreg, losartan and spironolactone - Continue with IV diuresis, daily weight, strict ins and outs  COPD.  - Continue submental oxygen per nasal cannula, continue bronchodilator therapy with DuoNeb's. Continue inhaled corticosteroids with fluticasone and long acting b agonist with vilaneterol.   Abnormal urinalysis.  - Patient is asymptomatic, he does have 0-5, cells, patient has received ceftriaxone in the emergency department. Will  follow-up on urinary culture. For now continue to hold antibiotics.  Seizures.  - Continue phenytoin.  Dementia.  - Continue donepezil  Code Status : Full  Family Communication  : None at bedside  Disposition Plan  : pending PT evaluation   Consults  :  none  Procedures  : None  DVT Prophylaxis  :  Lovenox   Lab Results  Component Value Date   PLT 229 12/04/2015  Antibiotics  :   Anti-infectives    Start     Dose/Rate Route Frequency Ordered Stop   12/03/15 0845  cefTRIAXone (ROCEPHIN) 1 g in dextrose 5 % 50 mL IVPB     1 g 100 mL/hr over 30 Minutes Intravenous  Once 12/03/15 0832 12/03/15 1046        Objective:   Vitals:   12/03/15 1349 12/03/15 2141 12/04/15 0525 12/04/15 0844  BP: 119/68 132/65 133/78   Pulse: 81 70 72   Resp: 18 16 16    Temp: 98 F (36.7 C) 98.5 F (36.9 C) 98.8 F (37.1 C)   TempSrc: Oral Oral Oral   SpO2: 96% 94% 94% 93%  Weight:   66.4 kg (146 lb 4.8 oz)   Height:        Wt Readings from Last 3 Encounters:  12/04/15 66.4 kg (146 lb 4.8 oz)  07/01/15 74.4 kg (164 lb)  08/08/14 71.7 kg (158 lb)     Intake/Output Summary (Last 24 hours) at 12/04/15 1208 Last data filed at 12/04/15 0525  Gross per 24 hour  Intake              120 ml  Output             1700 ml  Net            -1580 ml     Physical Exam  Awake Alert, Oriented X 3 Supple Neck,No JVD,  Symmetrical Chest wall movement, Good air movement bilaterally, CTAB RRR,No Gallops,Rubs or new Murmurs, No Parasternal Heave +ve B.Sounds, Abd Soft, No tenderness, No rebound - guarding or rigidity. No Cyanosis, Clubbing or edema, No new Rash or bruise     Data Review:    CBC  Recent Labs Lab 12/03/15 0701 12/04/15 0104  WBC 9.2 9.5  HGB 13.1 13.1  HCT 39.9 37.8*  PLT 251 229  MCV 91.3 88.1  MCH 30.0 30.5  MCHC 32.8 34.7  RDW 13.1 13.0  LYMPHSABS 0.9  --   MONOABS 1.2*  --   EOSABS 0.3  --   BASOSABS 0.1  --     Chemistries   Recent Labs Lab  12/03/15 0701 12/04/15 0104  NA 139 138  K 4.0 3.9  CL 111 108  CO2 23 26  GLUCOSE 93 95  BUN 14 18  CREATININE 1.19 1.30*  CALCIUM 9.1 9.0  AST  --  16  ALT  --  14*  ALKPHOS  --  76  BILITOT  --  0.4   ------------------------------------------------------------------------------------------------------------------ No results for input(s): CHOL, HDL, LDLCALC, TRIG, CHOLHDL, LDLDIRECT in the last 72 hours.  Lab Results  Component Value Date   HGBA1C 5.3 07/01/2015   ------------------------------------------------------------------------------------------------------------------ No results for input(s): TSH, T4TOTAL, T3FREE, THYROIDAB in the last 72 hours.  Invalid input(s): FREET3 ------------------------------------------------------------------------------------------------------------------ No results for input(s): VITAMINB12, FOLATE, FERRITIN, TIBC, IRON, RETICCTPCT in the last 72 hours.  Coagulation profile No results for input(s): INR, PROTIME in the last 168 hours.   Recent Labs  12/03/15 0701  DDIMER 0.48    Cardiac Enzymes  Recent Labs Lab 12/03/15 1421 12/03/15 1749 12/03/15 2026 12/04/15 0104  CKMB 2.0 3.2 1.3  --   TROPONINI 0.04*  --  0.04* 0.04*   ------------------------------------------------------------------------------------------------------------------    Component Value Date/Time   BNP 787.8 (H) 12/03/2015 0701    Inpatient Medications  Scheduled Meds: . aspirin  81 mg Oral Daily  . atorvastatin  10 mg Oral Daily  . carvedilol  12.5 mg Oral BID WC  . cholecalciferol  1,000 Units Oral Daily  . donepezil  10 mg Oral QHS  . enoxaparin (LOVENOX) injection  40 mg Subcutaneous Q24H  . fluticasone furoate-vilanterol  1 puff Inhalation Q breakfast  . furosemide  40 mg Intravenous Q12H  . losartan  50 mg Oral Daily  . mirabegron ER  50 mg Oral Q breakfast  . oxybutynin  15 mg Oral Daily  . phenytoin  200 mg Oral BID  . sodium  chloride flush  3 mL Intravenous Q12H  . spironolactone  25 mg Oral q morning - 10a   Continuous Infusions:  PRN Meds:.sodium chloride, acetaminophen **OR** acetaminophen, bismuth subsalicylate, dextromethorphan, nitroGLYCERIN, ondansetron **OR** ondansetron (ZOFRAN) IV, sodium chloride flush  Micro Results Recent Results (from the past 240 hour(s))  MRSA PCR Screening     Status: None   Collection Time: 12/03/15 11:31 AM  Result Value Ref Range Status   MRSA by PCR NEGATIVE NEGATIVE Final    Comment:        The GeneXpert MRSA Assay (FDA approved for NASAL specimens only), is one component of a comprehensive MRSA colonization surveillance program. It is not intended to diagnose MRSA infection nor to guide or monitor treatment for MRSA infections.     Radiology Reports Dg Chest 1 View  Result Date: 12/04/2015 CLINICAL DATA:  Shortness of breath today. EXAM: CHEST 1 VIEW COMPARISON:  12/03/2015 FINDINGS: The heart is enlarged but stable. Stable tortuosity of the thoracic aorta. Persistent but slightly improved patchy airspace process in the right lung. This is likely pneumonia. No pulmonary edema or pleural effusion. IMPRESSION: Overall slight improved lung aeration. Persistent right lung infiltrate. Electronically Signed   By: Marijo Sanes M.D.   On: 12/04/2015 09:14   Dg Chest 2 View  Result Date: 12/03/2015 CLINICAL DATA:  Worsening dyspnea and hypoxia. EXAM: CHEST  2 VIEW COMPARISON:  05/11/2011 FINDINGS: There is moderate vascular and interstitial congestion, new. There is a small right pleural effusion. There is moderate cardiomegaly. No focal confluent airspace consolidation. IMPRESSION: Vascular and interstitial changes suggesting mild congestive heart failure. Small right pleural effusion. Electronically Signed   By: Andreas Newport M.D.   On: 12/03/2015 06:49     Tajon Moring M.D on 12/04/2015 at 12:08 PM  Between 7am to 7pm - Pager - [phone]  After 7pm go to  [email or web address] - password [facility]  [name] -  Office  [phone]

## 2015-12-05 LAB — BASIC METABOLIC PANEL
ANION GAP: 8 (ref 5–15)
BUN: 29 mg/dL — ABNORMAL HIGH (ref 6–20)
CHLORIDE: 105 mmol/L (ref 101–111)
CO2: 25 mmol/L (ref 22–32)
Calcium: 8.9 mg/dL (ref 8.9–10.3)
Creatinine, Ser: 1.37 mg/dL — ABNORMAL HIGH (ref 0.61–1.24)
GFR calc Af Amer: 55 mL/min — ABNORMAL LOW (ref >60)
GFR, EST NON AFRICAN AMERICAN: 47 mL/min — AB (ref >60)
GLUCOSE: 82 mg/dL (ref 65–99)
POTASSIUM: 3.8 mmol/L (ref 3.5–5.1)
Sodium: 138 mmol/L (ref 135–145)

## 2015-12-05 LAB — URINE CULTURE

## 2015-12-05 LAB — CBC
HEMATOCRIT: 39.3 % (ref 39.0–52.0)
HEMOGLOBIN: 12.9 g/dL — AB (ref 13.0–17.0)
MCH: 29.4 pg (ref 26.0–34.0)
MCHC: 32.8 g/dL (ref 30.0–36.0)
MCV: 89.5 fL (ref 78.0–100.0)
PLATELETS: 258 10*3/uL (ref 150–400)
RBC: 4.39 MIL/uL (ref 4.22–5.81)
RDW: 12.7 % (ref 11.5–15.5)
WBC: 8.2 10*3/uL (ref 4.0–10.5)

## 2015-12-05 MED ORDER — LOSARTAN POTASSIUM 50 MG PO TABS
25.0000 mg | ORAL_TABLET | Freq: Every day | ORAL | Status: DC
  Administered 2015-12-06: 25 mg via ORAL
  Filled 2015-12-05: qty 1

## 2015-12-05 MED ORDER — CARVEDILOL 6.25 MG PO TABS
6.2500 mg | ORAL_TABLET | Freq: Two times a day (BID) | ORAL | Status: DC
  Administered 2015-12-05 – 2015-12-06 (×2): 6.25 mg via ORAL
  Filled 2015-12-05 (×2): qty 1

## 2015-12-05 MED ORDER — FUROSEMIDE 10 MG/ML IJ SOLN: 40.0000 mg | Freq: Every day | INTRAMUSCULAR | Status: DC

## 2015-12-05 MED ORDER — FUROSEMIDE 40 MG PO TABS
40.0000 mg | ORAL_TABLET | Freq: Every day | ORAL | Status: DC
  Administered 2015-12-05 – 2015-12-06 (×2): 40 mg via ORAL
  Filled 2015-12-05 (×2): qty 1

## 2015-12-05 NOTE — Progress Notes (Signed)
Taking over care of pt and agree with this shifts assessment 

## 2015-12-05 NOTE — Progress Notes (Signed)
PROGRESS NOTE                                                                                                                                                                                                             Patient Demographics:    [name], is a 82 y.o. male, DOB - Feb 06, 1933, [id number]  Admit date - 12/03/2015   Admitting Physician Allen Gerome Apley, MD  Outpatient Primary MD for the patient is Allen Ringer, MD  LOS - 2   Chief Complaint  Patient presents with  . Shortness of Breath       Brief Narrative  82 y.o. male with medical history significant of hypertension and systolic heart failure presents to the hospital with the chief complaint of worsening dyspnea, Workup significant for acute on chronic systolic CHF.   Subjective:    [name] today has, No headache, No chest pain, No abdominal pain - No Nausea, Denies cough, still reports dyspnea.  Assessment  & Plan :    Active Problems:   Hypoxia   CHF (congestive heart failure) (HCC)  Acute hypoxic respiratory failure - This is secondary to acute on chronic systolic CHF, chest x-ray with evidence of volume overload, wean oxygen as tolerated - Repeat Chest x-ray 9/2. after diuresis with persistent light lung infiltrate, no clinical evidence of pneumonia, afebrile, no leukocytosis, continue to monitor, will need to repeat chest x-ray in 2 weeks after discharge,   Acute on chronic systolic CHF - Most recent echo in 2012 showing EF 25-30%, repeat 2-D echo 9/2 showing EF 20%, with diffuse hypokinesis and akinesis in LAD area, which has been present previously, discussed with Dr. Domenic Larsen, no further workup indicated at this point. - Continue with Coreg, losartan and spironolactone, will lower Coreg and losartan dose giving soft blood pressure. - Volume status significantly improved on IV diuresis, will change to put all Lasix from tomorrow giving soft blood  pressure and slight increase in BUN and creatinine.  COPD.  - Continue submental oxygen per nasal cannula, continue bronchodilator therapy with DuoNeb's. Continue inhaled corticosteroids with fluticasone and long acting b agonist with vilaneterol.   Abnormal urinalysis.  - Patient is asymptomatic, he does have 0-5, cells, patient has received ceftriaxone in the emergency department. Will follow-up on urinary culture. For now continue to hold antibiotics.  Seizures.  -  Continue phenytoin.  Dementia.  - Continue donepezil  Code Status : Full  Family Communication  : Discussed with daughter in law over the phone Ms. [name]  Disposition Plan  : pending PT evaluation , very likely back to ALF in 1-2 days  Consults  :  none  Procedures  : None  DVT Prophylaxis  :  Lovenox   Lab Results  Component Value Date   PLT 258 12/05/2015    Antibiotics  :   Anti-infectives    Start     Dose/Rate Route Frequency Ordered Stop   12/03/15 0845  cefTRIAXone (ROCEPHIN) 1 g in dextrose 5 % 50 mL IVPB     1 g 100 mL/hr over 30 Minutes Intravenous  Once 12/03/15 0832 12/03/15 1046        Objective:   Vitals:   12/04/15 2146 12/05/15 0657 12/05/15 0806 12/05/15 1330  BP: (!) 114/55 120/66  (!) 102/59  Pulse: 67 69  70  Resp: 16 18  16   Temp: 98.4 F (36.9 C) 98.3 F (36.8 C)  98.2 F (36.8 C)  TempSrc: Oral Oral  Oral  SpO2: 95% 94% 94% 96%  Weight:  66.7 kg (147 lb 0.8 oz)    Height:        Wt Readings from Last 3 Encounters:  12/05/15 66.7 kg (147 lb 0.8 oz)  07/01/15 74.4 kg (164 lb)  08/08/14 71.7 kg (158 lb)     Intake/Output Summary (Last 24 hours) at 12/05/15 1412 Last data filed at 12/05/15 1300  Gross per 24 hour  Intake              360 ml  Output             1025 ml  Net             -665 ml     Physical Exam  Awake Alert, Oriented X 3 Supple Neck,No JVD,  Symmetrical Chest wall movement, Good air movement bilaterally, CTAB RRR,No Gallops,Rubs or new  Murmurs, No Parasternal Heave +ve B.Sounds, Abd Soft, No tenderness, No rebound - guarding or rigidity. No Cyanosis, Clubbing or edema, No new Rash or bruise     Data Review:    CBC  Recent Labs Lab 12/03/15 0701 12/04/15 0104 12/05/15 0547  WBC 9.2 9.5 8.2  HGB 13.1 13.1 12.9*  HCT 39.9 37.8* 39.3  PLT 251 229 258  MCV 91.3 88.1 89.5  MCH 30.0 30.5 29.4  MCHC 32.8 34.7 32.8  RDW 13.1 13.0 12.7  LYMPHSABS 0.9  --   --   MONOABS 1.2*  --   --   EOSABS 0.3  --   --   BASOSABS 0.1  --   --     Chemistries   Recent Labs Lab 12/03/15 0701 12/04/15 0104 12/05/15 0547  NA 139 138 138  K 4.0 3.9 3.8  CL 111 108 105  CO2 23 26 25   GLUCOSE 93 95 82  BUN 14 18 29*  CREATININE 1.19 1.30* 1.37*  CALCIUM 9.1 9.0 8.9  AST  --  16  --   ALT  --  14*  --   ALKPHOS  --  76  --   BILITOT  --  0.4  --    ------------------------------------------------------------------------------------------------------------------ No results for input(s): CHOL, HDL, LDLCALC, TRIG, CHOLHDL, LDLDIRECT in the last 72 hours.  Lab Results  Component Value Date   HGBA1C 5.3 07/01/2015   ------------------------------------------------------------------------------------------------------------------ No results for input(s): TSH, T4TOTAL,  T3FREE, THYROIDAB in the last 72 hours.  Invalid input(s): FREET3 ------------------------------------------------------------------------------------------------------------------ No results for input(s): VITAMINB12, FOLATE, FERRITIN, TIBC, IRON, RETICCTPCT in the last 72 hours.  Coagulation profile No results for input(s): INR, PROTIME in the last 168 hours.   Recent Labs  12/03/15 0701  DDIMER 0.48    Cardiac Enzymes  Recent Labs Lab 12/03/15 1421 12/03/15 1749 12/03/15 2026 12/04/15 0104  CKMB 2.0 3.2 1.3  --   TROPONINI 0.04*  --  0.04* 0.04*    ------------------------------------------------------------------------------------------------------------------    Component Value Date/Time   BNP 787.8 (H) 12/03/2015 0701    Inpatient Medications  Scheduled Meds: . aspirin  81 mg Oral Daily  . atorvastatin  10 mg Oral Daily  . carvedilol  6.25 mg Oral BID WC  . cholecalciferol  1,000 Units Oral Daily  . donepezil  10 mg Oral QHS  . enoxaparin (LOVENOX) injection  40 mg Subcutaneous Q24H  . fluticasone furoate-vilanterol  1 puff Inhalation Q breakfast  . furosemide  40 mg Oral Daily  . [START ON 12/06/2015] losartan  25 mg Oral Daily  . mirabegron ER  50 mg Oral Q breakfast  . oxybutynin  15 mg Oral Daily  . phenytoin  200 mg Oral BID  . sodium chloride flush  3 mL Intravenous Q12H  . spironolactone  25 mg Oral q morning - 10a   Continuous Infusions:  PRN Meds:.sodium chloride, acetaminophen **OR** acetaminophen, bismuth subsalicylate, dextromethorphan, nitroGLYCERIN, ondansetron **OR** ondansetron (ZOFRAN) IV, sodium chloride flush  Micro Results Recent Results (from the past 240 hour(s))  Culture, blood (Routine X 2) w Reflex to ID Panel     Status: None (Preliminary result)   Collection Time: 12/03/15  5:44 AM  Result Value Ref Range Status   Specimen Description BLOOD RIGHT FOREARM  Final   Special Requests BOTTLES DRAWN AEROBIC AND ANAEROBIC 5ML  Final   Culture   Final    NO GROWTH 1 DAY Performed at [facility]    Report Status PENDING  Incomplete  Urine culture     Status: Abnormal   Collection Time: 12/03/15  7:44 AM  Result Value Ref Range Status   Specimen Description URINE, CLEAN CATCH  Final   Special Requests NONE  Final   Culture >=100,000 COLONIES/mL CITROBACTER FREUNDII (A)  Final   Report Status 12/05/2015 FINAL  Final   Organism ID, Bacteria CITROBACTER FREUNDII (A)  Final      Susceptibility   Citrobacter freundii - MIC*    CEFAZOLIN >=64 RESISTANT Resistant     CEFTRIAXONE <=1  SENSITIVE Sensitive     CIPROFLOXACIN >=4 RESISTANT Resistant     GENTAMICIN <=1 SENSITIVE Sensitive     IMIPENEM 0.5 SENSITIVE Sensitive     NITROFURANTOIN <=16 SENSITIVE Sensitive     TRIMETH/SULFA <=20 SENSITIVE Sensitive     PIP/TAZO 8 SENSITIVE Sensitive     * >=100,000 COLONIES/mL CITROBACTER FREUNDII  Culture, blood (Routine X 2) w Reflex to ID Panel     Status: None (Preliminary result)   Collection Time: 12/03/15  8:20 AM  Result Value Ref Range Status   Specimen Description BLOOD LEFT ANTECUBITAL  Final   Special Requests BOTTLES DRAWN AEROBIC ONLY 3ML  Final   Culture   Final    NO GROWTH 1 DAY Performed at [facility]    Report Status PENDING  Incomplete  MRSA PCR Screening     Status: None   Collection Time: 12/03/15 11:31 AM  Result Value Ref  Range Status   MRSA by PCR NEGATIVE NEGATIVE Final    Comment:        The GeneXpert MRSA Assay (FDA approved for NASAL specimens only), is one component of a comprehensive MRSA colonization surveillance program. It is not intended to diagnose MRSA infection nor to guide or monitor treatment for MRSA infections.     Radiology Reports Dg Chest 1 View  Result Date: 12/04/2015 CLINICAL DATA:  Shortness of breath today. EXAM: CHEST 1 VIEW COMPARISON:  12/03/2015 FINDINGS: The heart is enlarged but stable. Stable tortuosity of the thoracic aorta. Persistent but slightly improved patchy airspace process in the right lung. This is likely pneumonia. No pulmonary edema or pleural effusion. IMPRESSION: Overall slight improved lung aeration. Persistent right lung infiltrate. Electronically Signed   By: Marijo Sanes M.D.   On: 12/04/2015 09:14   Dg Chest 2 View  Result Date: 12/03/2015 CLINICAL DATA:  Worsening dyspnea and hypoxia. EXAM: CHEST  2 VIEW COMPARISON:  05/11/2011 FINDINGS: There is moderate vascular and interstitial congestion, new. There is a small right pleural effusion. There is moderate cardiomegaly. No focal  confluent airspace consolidation. IMPRESSION: Vascular and interstitial changes suggesting mild congestive heart failure. Small right pleural effusion. Electronically Signed   By: Andreas Newport M.D.   On: 12/03/2015 06:49     Carlie Solorzano M.D on 12/05/2015 at 2:12 PM  Between 7am to 7pm - Pager - [phone]  After 7pm go to [email or web address] - password [facility]  [name] -  Office  [phone]

## 2015-12-06 MED ORDER — FUROSEMIDE 40 MG PO TABS: 40.0000 mg | ORAL_TABLET | Freq: Every day | ORAL | 0 refills | Status: DC

## 2015-12-06 MED ORDER — LOSARTAN POTASSIUM 50 MG PO TABS: 25.0000 mg | ORAL_TABLET | Freq: Every day | ORAL | 0 refills | Status: DC

## 2015-12-06 MED ORDER — CARVEDILOL 12.5 MG PO TABS: 6.2500 mg | ORAL_TABLET | Freq: Two times a day (BID) | ORAL | 0 refills | Status: DC

## 2015-12-06 NOTE — Progress Notes (Signed)
Patient is set to discharge back to [facility] today. CSW confirmed with Evelena Peat at ALF that they are able to take patient back. Patient & POA at bedside, [name] made aware. Discharge packet given to RN, Gregary Signs. [name] to transport back to ALF.     Raynaldo Opitz, [facility] Clinical Social Worker cell #: [phone]

## 2015-12-06 NOTE — Discharge Summary (Signed)
[name], is a 82 y.o. male  DOB 03/01/1933  MRN [id number].  Admission date:  12/03/2015  Admitting Physician  Mauricio Gerome Apley, MD  Discharge Date:  12/06/2015   Primary MD  Tivis Ringer, MD  Recommendations for primary care physician for things to follow:  - Check CBC, BMP next visit and adjust diuresis as needed - Please take 2 view chest x-ray in 2 weeks to ensure resolution of persistent right midlung infiltrate - Patient to follow with cardiology in 2 weeks  Admission Diagnosis  Hypoxia [R09.02]   Discharge Diagnosis  Hypoxia [R09.02]    Active Problems:   Hypoxia   CHF (congestive heart failure) ([place])      Past Medical History:  Diagnosis Date  . Acute MI [facility]) Dec 2010   cardiac catheriziation and stenting of the LAD (bare metal) 03/14/09              . Anemia   . Angina   . Blood transfusion   . CAD (coronary artery disease)   . COPD (chronic obstructive pulmonary disease) ([place])   . DEMENTIA   . High cholesterol   . Hypertension   . LV dysfunction    EF 25 to 30% per echo May 2012  . NSTEMI (non-ST elevated myocardial infarction) [facility]) Nov 2011   with LV dysfunction  . Prostatic hypertrophy   . Seizures ([place]) ~ 2006   "when blood pressure went up too high"; on Dilantin  . Urethral stricture    with subsequent trauma and has an indwelling Foley catheter in place during his first MI                                                 . Urinary catheter in place    chronic  . Vision impairment    "legally blind"    Past Surgical History:  Procedure Laterality Date  . CARDIAC CATHETERIZATION  02/2010  . 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 CHOLECYSTECTOMY  05/30/11  . CHOLECYSTECTOMY  05/30/2011   Procedure: LAPAROSCOPIC CHOLECYSTECTOMY;  Surgeon: Judieth Keens, DO;  Location: [facility];  Service: General;   Laterality: N/A;  . CORONARY ANGIOPLASTY WITH STENT PLACEMENT  03/2009   "1"; stent to LAD  . TONSILLECTOMY     "when I was small"       History of present illness and  Hospital Course:     Kindly see H&P for history of present illness and admission details, please review complete Labs, Consult reports and Test reports for all details in brief  HPI  from the history and physical done on the day of admission 12/03/2015  HPI: [name] is a 82 y.o. male with medical history significant of hypertension and systolic heart failure presents to the hospital with the chief  complaint of worsening dyspnea. For last 7 days patient has noticed dyspnea, predominantly on exertion and improved with rest, no associated claudication or angina, denies any PND, orthopnea or lower extremity edema. This morning he experienced acute, severe dyspnea that prompted him to come to the hospital for further evaluation. The episode dyspnea was not associated with wheezing or cough, no angina. His medical regimen includes spironolactone, being cautious with salt and fluid. He ambulates with the help of a cane and he lives in assisted living.   ED Course: Start on furosemide intravenously.  Hospital Course   Acute hypoxic respiratory failure - This is secondary to acute on chronic systolic CHF, chest x-ray with evidence of volume overload, wean oxygen as tolerated, resolved, currently back at room air - Repeat Chest x-ray 9/2. after diuresis with persistent light lung infiltrate, no clinical evidence of pneumonia, afebrile, no leukocytosis, continue to monitor, will need to repeat chest x-ray in 2 weeks after discharge,   Acute on chronic systolic CHF - Most recent echo in 2012 showing EF 25-30%, repeat 2-D echo 9/2 showing EF 20%, with diffuse hypokinesis and akinesis in LAD area, which has been present previously, discussed with Dr. Domenic Polite, no further workup indicated at this point. - Continue with Coreg,  losartan and spironolactone, Dilaudid Coreg and losartan dose giving soft blood pressure. - Volume status significantly improved on IV diuresis, can change to oral Lasix, will discharge her Lasix 40 mg oral daily. - Patient and family instructed to follow with patient primary cardiology Dr. Johnsie Cancel in 1-2 weeks.  COPD. - Continue with home meds  Abnormal urinalysis/chronic indwelling Foley catheter - Patient is asymptomatic, he does have 0-5, cells, patient has received ceftriaxone in the emergency department, culture growing Citrobacter , no  further treatment giving during hospital stay given patient has chronic indwelling Foley catheter, as well patient is asymptomatic, afebrile, with no leukocytosis, as discussed with ID Dr Linus Salmons. - Foley catheter change during hospital stay  Seizures.  - Continue phenytoin.  Dementia. - Continue donepezil   Discharge Condition:  Stable Discussed with family at bedside   Follow UP  Follow-up Information    Jenkins Rouge, MD Follow up in 2 week(s).   Specialty:  Cardiology Contact information: [id number] [address] [phone]             Discharge Instructions  and  Discharge Medications     Discharge Instructions    Discharge instructions    Complete by:  As directed   Follow with Primary MD Tivis Ringer, MD in 7 days   Get CBC, CMP, 2 view Chest X ray checked  by Primary MD next visit.    Activity: As tolerated with Full fall precautions use walker/cane & assistance as needed   Disposition ALF   Diet: Heart Healthy , with fluid restriction 1500 mL per day , with feeding assistance and aspiration precautions.  For Heart failure patients - Check your Weight same time everyday, if you gain over 2 pounds, or you develop in leg swelling, experience more shortness of breath or chest pain, call your Primary MD immediately. Follow Cardiac Low Salt Diet and 1.5 lit/day fluid  restriction.   On your next visit with your primary care physician please Get Medicines reviewed and adjusted.   Please request your Prim.MD to go over all Hospital Tests and Procedure/Radiological results at the follow up, please get all Hospital records sent to your Prim MD by signing hospital release before  you go home.   If you experience worsening of your admission symptoms, develop shortness of breath, life threatening emergency, suicidal or homicidal thoughts you must seek medical attention immediately by calling 911 or calling your MD immediately  if symptoms less severe.  You Must read complete instructions/literature along with all the possible adverse reactions/side effects for all the Medicines you take and that have been prescribed to you. Take any new Medicines after you have completely understood and accpet all the possible adverse reactions/side effects.   Do not drive, operating heavy machinery, perform activities at heights, swimming or participation in water activities or provide baby sitting services if your were admitted for syncope or siezures until you have seen by Primary MD or a Neurologist and advised to do so again.  Do not drive when taking Pain medications.    Do not take more than prescribed Pain, Sleep and Anxiety Medications  Special Instructions: If you have smoked or chewed Tobacco  in the last 2 yrs please stop smoking, stop any regular Alcohol  and or any Recreational drug use.  Wear Seat belts while driving.   Please note  You were cared for by a hospitalist during your hospital stay. If you have any questions about your discharge medications or the care you received while you were in the hospital after you are discharged, you can call the unit and asked to speak with the hospitalist on call if the hospitalist that took care of you is not available. Once you are discharged, your primary care physician will handle any further medical issues. Please note  that NO REFILLS for any discharge medications will be authorized once you are discharged, as it is imperative that you return to your primary care physician (or establish a relationship with a primary care physician if you do not have one) for your aftercare needs so that they can reassess your need for medications and monitor your lab values.       Medication List    TAKE these medications   aspirin 81 MG chewable tablet Chew 81 mg by mouth daily.   atorvastatin 10 MG tablet Commonly known as:  LIPITOR Take 10 mg by mouth daily.   bismuth subsalicylate [id number] [id number] suspension Commonly known as:  PEPTO BISMOL Take 30 mLs by mouth as needed for diarrhea or loose stools.   carvedilol 12.5 MG tablet Commonly known as:  COREG Take 0.5 tablets (6.25 mg total) by mouth 2 (two) times daily with a meal. What changed:  how much to take   cholecalciferol 1000 units tablet Commonly known as:  VITAMIN D Take 1,000 Units by mouth daily.   DELSYM 30 MG/5ML liquid Generic drug:  dextromethorphan Take 10 mLs by mouth as needed for cough.   donepezil 10 MG tablet Commonly known as:  ARICEPT Take 10 mg by mouth at bedtime.   fluticasone furoate-vilanterol 100-25 MCG/INH Aepb Commonly known as:  BREO ELLIPTA Inhale 1 puff into the lungs daily with breakfast.   furosemide 40 MG tablet Commonly known as:  LASIX Take 1 tablet (40 mg total) by mouth daily.   losartan 50 MG tablet Commonly known as:  COZAAR Take 0.5 tablets (25 mg total) by mouth daily. What changed:  how much to take   mirabegron ER 50 MG Tb24 tablet Commonly known as:  MYRBETRIQ Take 50 mg by mouth daily with breakfast.   nitroGLYCERIN 0.3 MG SL tablet Commonly known as:  NITROSTAT Place 0.3 mg under the tongue every 5 (  five) minutes as needed for chest pain.   oxybutynin 15 MG 24 hr tablet Commonly known as:  DITROPAN XL Take 15 mg by mouth daily.   phenytoin 100 MG ER capsule Commonly known as:  DILANTIN Take  200 mg by mouth 2 (two) times daily.   PREPARATION H HYDROCORTISONE 1 % Generic drug:  hydrocortisone cream Apply 1 application topically 2 (two) times daily as needed. For hemmoroids.   spironolactone 25 MG tablet Commonly known as:  ALDACTONE Take 25 mg by mouth every morning.         Diet and Activity recommendation: See Discharge Instructions above   Consults obtained -  None   Major procedures and Radiology Reports - PLEASE review detailed and final reports for all details, in brief -      Dg Chest 1 View  Result Date: 12/04/2015 CLINICAL DATA:  Shortness of breath today. EXAM: CHEST 1 VIEW COMPARISON:  12/03/2015 FINDINGS: The heart is enlarged but stable. Stable tortuosity of the thoracic aorta. Persistent but slightly improved patchy airspace process in the right lung. This is likely pneumonia. No pulmonary edema or pleural effusion. IMPRESSION: Overall slight improved lung aeration. Persistent right lung infiltrate. Electronically Signed   By: Marijo Sanes M.D.   On: 12/04/2015 09:14   Dg Chest 2 View  Result Date: 12/03/2015 CLINICAL DATA:  Worsening dyspnea and hypoxia. EXAM: CHEST  2 VIEW COMPARISON:  05/11/2011 FINDINGS: There is moderate vascular and interstitial congestion, new. There is a small right pleural effusion. There is moderate cardiomegaly. No focal confluent airspace consolidation. IMPRESSION: Vascular and interstitial changes suggesting mild congestive heart failure. Small right pleural effusion. Electronically Signed   By: Andreas Newport M.D.   On: 12/03/2015 06:49    Micro Results    Recent Results (from the past 240 hour(s))  Culture, blood (Routine X 2) w Reflex to ID Panel     Status: None (Preliminary result)   Collection Time: 12/03/15  5:44 AM  Result Value Ref Range Status   Specimen Description BLOOD RIGHT FOREARM  Final   Special Requests BOTTLES DRAWN AEROBIC AND ANAEROBIC 5ML  Final   Culture   Final    NO GROWTH 3  DAYS Performed at [facility]    Report Status PENDING  Incomplete  Urine culture     Status: Abnormal   Collection Time: 12/03/15  7:44 AM  Result Value Ref Range Status   Specimen Description URINE, CLEAN CATCH  Final   Special Requests NONE  Final   Culture >=100,000 COLONIES/mL CITROBACTER FREUNDII (A)  Final   Report Status 12/05/2015 FINAL  Final   Organism ID, Bacteria CITROBACTER FREUNDII (A)  Final      Susceptibility   Citrobacter freundii - MIC*    CEFAZOLIN >=64 RESISTANT Resistant     CEFTRIAXONE <=1 SENSITIVE Sensitive     CIPROFLOXACIN >=4 RESISTANT Resistant     GENTAMICIN <=1 SENSITIVE Sensitive     IMIPENEM 0.5 SENSITIVE Sensitive     NITROFURANTOIN <=16 SENSITIVE Sensitive     TRIMETH/SULFA <=20 SENSITIVE Sensitive     PIP/TAZO 8 SENSITIVE Sensitive     * >=100,000 COLONIES/mL CITROBACTER FREUNDII  Culture, blood (Routine X 2) w Reflex to ID Panel     Status: None (Preliminary result)   Collection Time: 12/03/15  8:20 AM  Result Value Ref Range Status   Specimen Description BLOOD LEFT ANTECUBITAL  Final   Special Requests BOTTLES DRAWN AEROBIC ONLY 3ML  Final   Culture  Final    NO GROWTH 3 DAYS Performed at [facility]    Report Status PENDING  Incomplete  MRSA PCR Screening     Status: None   Collection Time: 12/03/15 11:31 AM  Result Value Ref Range Status   MRSA by PCR NEGATIVE NEGATIVE Final    Comment:        The GeneXpert MRSA Assay (FDA approved for NASAL specimens only), is one component of a comprehensive MRSA colonization surveillance program. It is not intended to diagnose MRSA infection nor to guide or monitor treatment for MRSA infections.        Today   Subjective:   [name] today has no headache,no chest or abdominal pain,no new weakness tingling or numbness, feels much better wants to go home today.   Objective:   Blood pressure (!) 117/59, pulse 77, temperature 97.8 F (36.6 C), temperature  source Oral, resp. rate 18, height 5\' 8"  (1.727 m), weight 68.7 kg (151 lb 6.4 oz), SpO2 97 %.   Intake/Output Summary (Last 24 hours) at 12/06/15 1355 Last data filed at 12/06/15 0412  Gross per 24 hour  Intake              420 ml  Output              800 ml  Net             -380 ml    Exam Awake Alert, Oriented X 3 Supple Neck,No JVD,  Symmetrical Chest wall movement, Good air movement bilaterally, CTAB RRR,No Gallops,Rubs or new Murmurs, No Parasternal Heave +ve B.Sounds, Abd Soft, No tenderness, No rebound - guarding or rigidity. No Cyanosis, Clubbing or edema, No new Rash or bruise  Data Review   CBC w Diff: Lab Results  Component Value Date   WBC 8.2 12/05/2015   HGB 12.9 (L) 12/05/2015   HCT 39.3 12/05/2015   PLT 258 12/05/2015   LYMPHOPCT 10 12/03/2015   MONOPCT 13 12/03/2015   EOSPCT 4 12/03/2015   BASOPCT 1 12/03/2015    CMP: Lab Results  Component Value Date   NA 138 12/05/2015   K 3.8 12/05/2015   CL 105 12/05/2015   CO2 25 12/05/2015   BUN 29 (H) 12/05/2015   CREATININE 1.37 (H) 12/05/2015   PROT 7.0 12/04/2015   ALBUMIN 3.6 12/04/2015   BILITOT 0.4 12/04/2015   ALKPHOS 76 12/04/2015   AST 16 12/04/2015   ALT 14 (L) 12/04/2015  .   Total Time in preparing paper work, data evaluation and todays exam - 35 minutes  Corban Kistler M.D on 12/06/2015 at 1:55 PM  [name]   Office  [phone]

## 2015-12-06 NOTE — Evaluation (Signed)
Physical Therapy Evaluation Patient Details Name: [name] MRN: [id number] DOB: 04-12-1932 Today's Date: 12/06/2015   History of Present Illness  [name] is a 82 y.o. male with medical history significant of hypertension and systolic heart failure presents to the hospital with the chief complaint of worsening dyspnea  Clinical Impression  Pt admitted with above diagnosis. Pt currently with functional limitations due to the deficits listed below (see PT Problem List).   Pt will benefit from skilled PT to increase their independence and safety with mobility to allow discharge to the venue  listed below.   Pt would benefit from use of RW initially at D/C, had LOB x2 today with HHA of 1, pt reports he normally amb with cane at baseline; he is in agreement with need for RW and HHPT to prevent falls/incr IND    Follow Up Recommendations Home health PT (at ALF)    Equipment Recommendations  None recommended by PT    Recommendations for Other Services       Precautions / Restrictions Precautions Precautions: Fall      Mobility  Bed Mobility               General bed mobility comments: OOB in chair  Transfers Overall transfer level: Needs assistance Equipment used: None Transfers: Sit to/from Stand           General transfer comment: cues for hand placement and control of descent  Ambulation/Gait Ambulation/Gait assistance: Min assist   Assistive device: 1 person hand held assist Gait Pattern/deviations: Step-through pattern;Trunk flexed     General Gait Details: pt unsteady initially, LOB x2 with min to recover; O2 sats 97% on RA  Stairs            Wheelchair Mobility    Modified Rankin (Stroke Patients Only)       Balance Overall balance assessment: Needs assistance           Standing balance-Leahy Scale: Fair               High level balance activites: Turns;Head turns High Level Balance Comments: LOB x with min assist to  recover during dynamic activities             Pertinent Vitals/Pain Pain Assessment: No/denies pain    Home Living Family/patient expects to be discharged to:: Assisted living               Home Equipment: Walker - 2 wheels;Cane - single point Additional Comments: amb with cane at baseline    Prior Function Level of Independence: Independent with assistive device(s)               Hand Dominance        Extremity/Trunk Assessment   Upper Extremity Assessment: Defer to OT evaluation;Generalized weakness           Lower Extremity Assessment: Generalized weakness         Communication   Communication: No difficulties  Cognition Arousal/Alertness: Awake/alert Behavior During Therapy: WFL for tasks assessed/performed Overall Cognitive Status: Within Functional Limits for tasks assessed                      General Comments      Exercises        Assessment/Plan    PT Assessment Patient needs continued PT services  PT Diagnosis Difficulty walking   PT Problem List Decreased strength;Decreased activity tolerance;Decreased balance;Decreased mobility;Decreased knowledge of use of DME  PT Treatment Interventions DME instruction;Gait training;Functional mobility training;Balance training;Therapeutic exercise;Therapeutic activities   PT Goals (Current goals can be found in the Care Plan section) Acute Rehab PT Goals Patient Stated Goal: get a little stronger PT Goal Formulation: With patient Time For Goal Achievement: 12/13/15 Potential to Achieve Goals: Good    Frequency Min 3X/week   Barriers to discharge        Co-evaluation               End of Session Equipment Utilized During Treatment: Gait belt Activity Tolerance: Patient tolerated treatment well Patient left: in chair;with call bell/phone within reach;with chair alarm set           Time: 1222-1241 PT Time Calculation (min) (ACUTE ONLY): 19 min   Charges:   PT  Evaluation $PT Eval Low Complexity: 1 Procedure     PT G Codes:        [name] 12/08/2015, 1:29 PM

## 2015-12-06 NOTE — Discharge Instructions (Signed)
Follow with Primary MD Tivis Ringer, MD in 7 days   Get CBC, CMP, 2 view Chest X ray checked  by Primary MD next visit.    Activity: As tolerated with Full fall precautions use walker/cane & assistance as needed   Disposition ALF   Diet: Heart Healthy , with fluid restriction 1500 mL per day , with feeding assistance and aspiration precautions.  For Heart failure patients - Check your Weight same time everyday, if you gain over 2 pounds, or you develop in leg swelling, experience more shortness of breath or chest pain, call your Primary MD immediately. Follow Cardiac Low Salt Diet and 1.5 lit/day fluid restriction.   On your next visit with your primary care physician please Get Medicines reviewed and adjusted.   Please request your Prim.MD to go over all Hospital Tests and Procedure/Radiological results at the follow up, please get all Hospital records sent to your Prim MD by signing hospital release before you go home.   If you experience worsening of your admission symptoms, develop shortness of breath, life threatening emergency, suicidal or homicidal thoughts you must seek medical attention immediately by calling 911 or calling your MD immediately  if symptoms less severe.  You Must read complete instructions/literature along with all the possible adverse reactions/side effects for all the Medicines you take and that have been prescribed to you. Take any new Medicines after you have completely understood and accpet all the possible adverse reactions/side effects.   Do not drive, operating heavy machinery, perform activities at heights, swimming or participation in water activities or provide baby sitting services if your were admitted for syncope or siezures until you have seen by Primary MD or a Neurologist and advised to do so again.  Do not drive when taking Pain medications.    Do not take more than prescribed Pain, Sleep and Anxiety Medications  Special Instructions:  If you have smoked or chewed Tobacco  in the last 2 yrs please stop smoking, stop any regular Alcohol  and or any Recreational drug use.  Wear Seat belts while driving.   Please note  You were cared for by a hospitalist during your hospital stay. If you have any questions about your discharge medications or the care you received while you were in the hospital after you are discharged, you can call the unit and asked to speak with the hospitalist on call if the hospitalist that took care of you is not available. Once you are discharged, your primary care physician will handle any further medical issues. Please note that NO REFILLS for any discharge medications will be authorized once you are discharged, as it is imperative that you return to your primary care physician (or establish a relationship with a primary care physician if you do not have one) for your aftercare needs so that they can reassess your need for medications and monitor your lab values.

## 2015-12-06 NOTE — Care Management Note (Signed)
Case Management Note  Patient Details  Name: [name] MRN: [id number] Date of Birth: 09/01/1932  Subjective/Objective: Pt recc HHPT, [facility]w-[facility] w/[facility]-faxed w/confirmation HHPT,f23f order. [facility] dme rep Jermaine aware of rw orders to deliver to rm prior d/c.Family will transp back to [place] on own.   No further CM needs.                Action/Plan:d/c ALF.   Expected Discharge Date:   (unknown)               Expected Discharge Plan:  Assisted Living / Rest Home  In-House Referral:     Discharge planning Services  CM Consult  Post Acute Care Choice:    Choice offered to:  Patient  DME Arranged:  Walker rolling DME Agency:  [place]:  PT [place] Agency:   ([place])  Status of Service:  Completed, signed off  If discussed at [place] of Stay Meetings, dates discussed:    Additional Comments:  Dessa Phi, RN 12/06/2015, 2:33 PM

## 2015-12-08 LAB — CULTURE, BLOOD (ROUTINE X 2)
Culture: NO GROWTH
Culture: NO GROWTH

## 2016-01-07 ENCOUNTER — Telehealth: Payer: Self-pay | Admitting: Cardiovascular Disease

## 2016-01-07 NOTE — Telephone Encounter (Signed)
Received records from [facility] for appointment with Dr Johnsie Cancel on 01/21/16 .  Records sent to [facility]. Chart Prep for Dr Kyla Balzarine schedule.  Sent via [place] on 01/07/16. lp

## 2016-01-20 NOTE — Progress Notes (Signed)
Cardiology Office Note   Date:  01/21/2016   ID:  [name], DOB 24-Jul-1932, MRN [id number]  PCP:  Tivis Ringer, MD  Cardiologist:   Jenkins Rouge, MD   No chief complaint on file.     History of Present Illness: [name] is a 82 y.o. male who presents for evaluation of CHF.  Last seen 2013. Anterior MI with stent to mid LAD in 2010. Last myovue 2013 with large anterior MI and EF 24% . Last echo reviewed 12/04/15 and EF 20% with mild MR and estimated PA pressure 46 mmHg.  Seen at [facility] by pulmonary and hospitalist 12/06/15.  Dyspnea and volume overload. Also has dementia and chronic indwelling foley.   Diuresed and d/c for outpatient f/u. CXR reveiwed had right lung infiltrate Troponin only .04 Cr 1.3 after diuresis BNP 787  He has been blind since 10 He lives at [facility]. Step daugher is his power of  attorney  He played music by ear his whole life and has CD;s out  Compliant with meds and doing well since hospital d/c  Past Medical History:  Diagnosis Date  . Acute MI Dec 2010   cardiac catheriziation and stenting of the LAD (bare metal) 03/14/09              . Anemia   . Angina   . Blood transfusion   . CAD (coronary artery disease)   . COPD (chronic obstructive pulmonary disease) ([place])   . DEMENTIA   . High cholesterol   . Hypertension   . LV dysfunction    EF 25 to 30% per echo May 2012  . NSTEMI (non-ST elevated myocardial infarction) [facility]) Nov 2011   with LV dysfunction  . Prostatic hypertrophy   . Seizures ([place]) ~ 2006   "when blood pressure went up too high"; on Dilantin  . Urethral stricture    with subsequent trauma and has an indwelling Foley catheter in place during his first MI                                                 . Urinary catheter in place    chronic  . Vision impairment    "legally blind"    Past Surgical History:  Procedure Laterality Date  . CARDIAC CATHETERIZATION  02/2010  . 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 CHOLECYSTECTOMY  05/30/11  . CHOLECYSTECTOMY  05/30/2011   Procedure: LAPAROSCOPIC CHOLECYSTECTOMY;  Surgeon: Judieth Keens, DO;  Location: [place];  Service: General;  Laterality: N/A;  . CORONARY ANGIOPLASTY WITH STENT PLACEMENT  03/2009   "1"; stent to LAD  . TONSILLECTOMY     "when I was small"     Current Outpatient Prescriptions  Medication Sig Dispense Refill  . aspirin 81 MG chewable tablet Chew 81 mg by mouth daily.    Marland Kitchen atorvastatin (LIPITOR) 10 MG tablet Take 10 mg by mouth daily.    Marland Kitchen bismuth subsalicylate (PEPTO BISMOL) 262 MG/15ML suspension Take 30 mLs by mouth as needed for diarrhea or loose stools.     . carvedilol (COREG) 12.5 MG tablet Take 0.5 tablets (6.25 mg total) by mouth 2 (two) times daily with a meal. 30 tablet 0  . cholecalciferol (VITAMIN D) 1000 UNITS tablet  Take 1,000 Units by mouth daily.    Marland Kitchen dextromethorphan (DELSYM) 30 MG/5ML liquid Take 10 mLs by mouth as needed for cough.    . donepezil (ARICEPT) 10 MG tablet Take 10 mg by mouth at bedtime.    . Fluticasone Furoate-Vilanterol 100-25 MCG/INH AEPB Inhale 1 puff into the lungs daily with breakfast.    . furosemide (LASIX) 40 MG tablet Take 1 tablet (40 mg total) by mouth daily. 30 tablet 0  . hydrocortisone cream (PREPARATION H HYDROCORTISONE) 1 % Apply 1 application topically 2 (two) times daily as needed. For hemmoroids.    Marland Kitchen losartan (COZAAR) 50 MG tablet Take 0.5 tablets (25 mg total) by mouth daily. 30 tablet 0  . mirabegron ER (MYRBETRIQ) 50 MG TB24 tablet Take 50 mg by mouth daily with breakfast.    . nitroGLYCERIN (NITROSTAT) 0.3 MG SL tablet Place 0.3 mg under the tongue every 5 (five) minutes as needed for chest pain.    Marland Kitchen oxybutynin (DITROPAN XL) 15 MG 24 hr tablet Take 15 mg by mouth daily.      . phenytoin (DILANTIN) 100 MG ER capsule Take 200 mg by mouth 2 (two) times daily.    Marland Kitchen spironolactone (ALDACTONE) 25 MG tablet Take 25 mg by  mouth every morning.     No current facility-administered medications for this visit.     Allergies:   Red dye and Iodinated diagnostic agents    Social History:  The patient  reports that he quit smoking about 11 years ago. His smoking use included Cigarettes. He smoked 0.00 packs per day for 50.00 years. He has never used smokeless tobacco. He reports that he does not drink alcohol or use drugs.   Family History:  The patient's family history is not on file.    ROS:  Please see the history of present illness.   Otherwise, review of systems are positive for none.   All other systems are reviewed and negative.    PHYSICAL EXAM: VS:  BP 128/72 (BP Location: Right Arm, Patient Position: Sitting, Cuff Size: Normal)   Pulse 72   Ht 5\' 10"  (1.778 m)   Wt 69.9 kg (154 lb)   SpO2 95%   BMI 22.10 kg/m  , BMI Body mass index is 22.1 kg/m. Affect appropriate Elderly black male  HEENT: blind with poor dentition  Neck supple with no adenopathy JVP normal no bruits no thyromegaly Lungs clear with no wheezing and good diaphragmatic motion Heart:  S1/S2 SEM  murmur, no rub, gallop or click PMI normal Abdomen: benighn, BS positve, no tenderness, no AAA no bruit.  No HSM or HJR Distal pulses intact with no bruits No edema Neuro non-focal Skin warm and dry No muscular weakness    EKG: SR LVH LAD lateral T wave changes    Recent Labs: 07/01/2015: TSH 0.610 12/03/2015: B Natriuretic Peptide 787.8 12/04/2015: ALT 14 12/05/2015: BUN 29; Creatinine, Ser 1.37; Hemoglobin 12.9; Platelets 258; Potassium 3.8; Sodium 138    Lipid Panel    Component Value Date/Time   CHOL  02/10/2010 0642    139        ATP III CLASSIFICATION:  <200     mg/dL   Desirable  200-239  mg/dL   Borderline High  >=240    mg/dL   High          TRIG 42 02/10/2010 0642   HDL 48 02/10/2010 0642   CHOLHDL 2.9 02/10/2010 0642   VLDL 8 02/10/2010 [id number]  [place]  02/10/2010 0642    83        Total  Cholesterol/HDL:CHD Risk Coronary Heart Disease Risk Table                     Men   Women  1/2 Average Risk   3.4   3.3  Average Risk       5.0   4.4  2 X Average Risk   9.6   7.1  3 X Average Risk  23.4   11.0        Use the calculated Patient Ratio above and the CHD Risk Table to determine the patient's CHD Risk.        ATP III CLASSIFICATION (LDL):  <100     mg/dL   Optimal  100-129  mg/dL   Near or Above                    Optimal  130-159  mg/dL   Borderline  160-189  mg/dL   High  >190     mg/dL   Very High      Wt Readings from Last 3 Encounters:  01/21/16 69.9 kg (154 lb)  12/06/15 68.7 kg (151 lb 6.4 oz)  07/01/15 74.4 kg (164 lb)      Other studies Reviewed: Additional studies/ records that were reviewed today include: Notes 2013 cath 2010/2011 notes primary [facility] September and notes admission [facility] September .    ASSESSMENT AND PLAN:  1.  Chronic Systolic CHF stable on medical rx no aggressive Rx given age and debility  2. CAD stable no chest pain continue medical Rx 3. HTN Well controlled.  Continue current medications and low sodium Dash type diet.   4. COPD improved no active wheezing has had flu and penumonia shot 5. Dementia  Stable quite conversant today Does not do much but sleep and eat at SNF   Current medicines are reviewed at length with the patient today.  The patient does not have concerns regarding medicines.  The following changes have been made:  no change  Labs/ tests ordered today include: None  No orders of the defined types were placed in this encounter.    Disposition:   FU with [place] in 6 months      Signed, Jenkins Rouge, MD  01/21/2016 2:52 PM    [facility] [address], [place], [place]  [address] Phone: [phone]; Fax: [phone]

## 2016-01-21 ENCOUNTER — Encounter (INDEPENDENT_AMBULATORY_CARE_PROVIDER_SITE_OTHER): Payer: Self-pay

## 2016-01-21 ENCOUNTER — Ambulatory Visit (INDEPENDENT_AMBULATORY_CARE_PROVIDER_SITE_OTHER): Payer: Medicare Other | Admitting: Cardiovascular Disease

## 2016-01-21 ENCOUNTER — Encounter: Payer: Self-pay | Admitting: Cardiovascular Disease

## 2016-01-21 VITALS — BP 128/72 | HR 72 | Ht 70.0 in | Wt 154.0 lb

## 2016-01-21 DIAGNOSIS — I5022 Chronic systolic (congestive) heart failure: Secondary | ICD-10-CM | POA: Diagnosis not present

## 2016-01-21 NOTE — Patient Instructions (Signed)
Medication Instructions:  Your physician recommends that you continue on your current medications as directed. Please refer to the Current Medication list given to you today.  Labwork: NONE  Testing/Procedures: NONE  Follow-Up: Your physician wants you to follow-up in: 6 months with Dr. Johnsie Cancel. You will receive a reminder letter in the mail two months in advance. If you don't receive a letter, please call our office to schedule the follow-up appointment.   If you need a refill on your cardiac medications before your next appointment, please call your pharmacy.   Please have an up to date list of patient's medication faxed to our office. Our fax number is [phone]

## 2016-01-26 ENCOUNTER — Ambulatory Visit: Payer: Medicare Other | Admitting: Cardiovascular Disease

## 2016-01-26 NOTE — Progress Notes (Signed)
Receive up to date medication list from patient's residents at [facility].

## 2016-02-04 ENCOUNTER — Ambulatory Visit: Payer: Medicare Other | Admitting: Cardiovascular Disease

## 2016-02-27 ENCOUNTER — Encounter (HOSPITAL_COMMUNITY): Payer: Self-pay | Admitting: Emergency Medicine

## 2016-02-27 ENCOUNTER — Emergency Department (HOSPITAL_COMMUNITY)
Admission: EM | Admit: 2016-02-27 | Discharge: 2016-02-28 | Disposition: A | Discharge status: Skilled Nursing Facility | Payer: Medicare Other | Attending: Emergency Medicine | Admitting: Emergency Medicine

## 2016-02-27 DIAGNOSIS — R197 Diarrhea, unspecified: Secondary | ICD-10-CM | POA: Diagnosis not present

## 2016-02-27 DIAGNOSIS — I110 Hypertensive heart disease with heart failure: Secondary | ICD-10-CM | POA: Diagnosis not present

## 2016-02-27 DIAGNOSIS — I509 Heart failure, unspecified: Secondary | ICD-10-CM | POA: Diagnosis not present

## 2016-02-27 DIAGNOSIS — Z87891 Personal history of nicotine dependence: Secondary | ICD-10-CM | POA: Insufficient documentation

## 2016-02-27 DIAGNOSIS — Z79899 Other long term (current) drug therapy: Secondary | ICD-10-CM | POA: Diagnosis not present

## 2016-02-27 DIAGNOSIS — Z7982 Long term (current) use of aspirin: Secondary | ICD-10-CM | POA: Diagnosis not present

## 2016-02-27 DIAGNOSIS — Z955 Presence of coronary angioplasty implant and graft: Secondary | ICD-10-CM | POA: Diagnosis not present

## 2016-02-27 DIAGNOSIS — I2510 Atherosclerotic heart disease of native coronary artery without angina pectoris: Secondary | ICD-10-CM | POA: Diagnosis not present

## 2016-02-27 DIAGNOSIS — J449 Chronic obstructive pulmonary disease, unspecified: Secondary | ICD-10-CM | POA: Insufficient documentation

## 2016-02-27 DIAGNOSIS — I252 Old myocardial infarction: Secondary | ICD-10-CM | POA: Insufficient documentation

## 2016-02-27 LAB — CBC WITH DIFFERENTIAL/PLATELET
BASOS ABS: 0 10*3/uL (ref 0.0–0.1)
BASOS PCT: 0 %
Eosinophils Absolute: 0.1 10*3/uL (ref 0.0–0.7)
Eosinophils Relative: 2 %
HEMATOCRIT: 37 % — AB (ref 39.0–52.0)
HEMOGLOBIN: 12.1 g/dL — AB (ref 13.0–17.0)
LYMPHS PCT: 14 %
Lymphs Abs: 0.8 10*3/uL (ref 0.7–4.0)
MCH: 30.1 pg (ref 26.0–34.0)
MCHC: 32.7 g/dL (ref 30.0–36.0)
MCV: 92 fL (ref 78.0–100.0)
MONO ABS: 0.6 10*3/uL (ref 0.1–1.0)
Monocytes Relative: 10 %
NEUTROS ABS: 4.3 10*3/uL (ref 1.7–7.7)
NEUTROS PCT: 74 %
Platelets: 213 10*3/uL (ref 150–400)
RBC: 4.02 MIL/uL — ABNORMAL LOW (ref 4.22–5.81)
RDW: 12.8 % (ref 11.5–15.5)
WBC: 5.8 10*3/uL (ref 4.0–10.5)

## 2016-02-27 MED ORDER — LOPERAMIDE HCL 2 MG PO CAPS
2.0000 mg | ORAL_CAPSULE | Freq: Once | ORAL | Status: AC
  Administered 2016-02-27: 2 mg via ORAL
  Filled 2016-02-27: qty 1

## 2016-02-27 MED ORDER — SODIUM CHLORIDE 0.9 % IV BOLUS (SEPSIS)
500.0000 mL | Freq: Once | INTRAVENOUS | Status: AC
  Administered 2016-02-27: 500 mL via INTRAVENOUS

## 2016-02-27 NOTE — ED Provider Notes (Signed)
[facility] DEPT [facility] Provider Note   CSN: [id number] Arrival date & time: 02/27/16  2254   By signing my name below, I, Eunice Blase, attest that this documentation has been prepared under the direction and in the presence of No att. providers found. Electronically signed, Eunice Blase, ED Scribe. 02/27/16. 11:12 PM.   History   Chief Complaint Chief Complaint  Patient presents with  . Abdominal Pain  . Diarrhea   The history is provided by the patient. No language interpreter was used.   HPI Comments: [name] is a 82 y.o. male who presents to the Emergency Department complaining of episodic lower abdominal pain x 2 days. Pt reports associated diarrhea (3-4 times today). He currently reports no pain in his abdomen. Pt denies vomit, dizziness, SOB, cough, and congestion.   Past Medical History:  Diagnosis Date  . Acute MI Dec 2010   cardiac catheriziation and stenting of the LAD (bare metal) 03/14/09              . Anemia   . Angina   . Blood transfusion   . CAD (coronary artery disease)   . COPD (chronic obstructive pulmonary disease) ([place])   . DEMENTIA   . High cholesterol   . Hypertension   . LV dysfunction    EF 25 to 30% per echo May 2012  . NSTEMI (non-ST elevated myocardial infarction) [facility]) Nov 2011   with LV dysfunction  . Prostatic hypertrophy   . Seizures ([place]) ~ 2006   "when blood pressure went up too high"; on Dilantin  . Urethral stricture    with subsequent trauma and has an indwelling Foley catheter in place during his first MI                                                 . Urinary catheter in place    chronic  . Vision impairment    "legally blind"    Patient Active Problem List   Diagnosis Date Noted  . Hypoxia 12/03/2015  . CHF (congestive heart failure) ([place]) 12/03/2015  . Rectal bleeding 07/01/2015  . UTI (lower urinary tract infection) 03/07/2012  . Lower GI bleed = suspect diverticular 03/06/2012  . Chest pain,  unspecified 05/11/2011  . Preop cardiovascular exam 12/07/2010  . CAD (coronary artery disease) 08/24/2010  . Hypertension   . Seizure disorder ([place])   . Anemia   . LV dysfunction   . NSTEMI (non-ST elevated myocardial infarction) [facility])     Past Surgical History:  Procedure Laterality Date  . CARDIAC CATHETERIZATION  02/2010  . 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 CHOLECYSTECTOMY  05/30/11  . CHOLECYSTECTOMY  05/30/2011   Procedure: LAPAROSCOPIC CHOLECYSTECTOMY;  Surgeon: Judieth Keens, DO;  Location: [place];  Service: General;  Laterality: N/A;  . CORONARY ANGIOPLASTY WITH STENT PLACEMENT  03/2009   "1"; stent to LAD  . TONSILLECTOMY     "when I was small"       Home Medications    Prior to Admission medications   Medication Sig Start Date End Date Taking? Authorizing Provider  acetaminophen (TYLENOL) 500 MG tablet Take 1,000 mg by mouth every 8 (eight) hours as needed for moderate pain.   Yes Historical Provider, MD  aspirin 81 MG chewable  tablet Chew 81 mg by mouth daily.   Yes Historical Provider, MD  atorvastatin (LIPITOR) 10 MG tablet Take 10 mg by mouth daily.   Yes Historical Provider, MD  benzocaine (ORAJEL MOUTH-AID) 20 % GEL Use as directed 1 application in the mouth or throat 4 (four) times daily as needed (gum pain).   Yes Historical Provider, MD  bismuth subsalicylate (PEPTO BISMOL) 262 MG/15ML suspension Take 30 mLs by mouth as needed for diarrhea or loose stools.    Yes Historical Provider, MD  carvedilol (COREG) 6.25 MG tablet Take 6.25 mg by mouth 2 (two) times daily with a meal.   Yes Historical Provider, MD  carvedilol (COREG) 6.25 MG tablet Take 6.25 mg by mouth 2 (two) times daily with a meal.   Yes Historical Provider, MD  cholecalciferol (VITAMIN D) 1000 UNITS tablet Take 1,000 Units by mouth daily.   Yes Historical Provider, MD  dextromethorphan (DELSYM) 30 MG/5ML liquid Take 10 mLs by mouth as needed for  cough.   Yes Historical Provider, MD  donepezil (ARICEPT) 10 MG tablet Take 10 mg by mouth at bedtime.   Yes Historical Provider, MD  Fluticasone Furoate-Vilanterol 100-25 MCG/INH AEPB Inhale 1 puff into the lungs daily with breakfast.   Yes Historical Provider, MD  furosemide (LASIX) 40 MG tablet Take 1 tablet (40 mg total) by mouth daily. 12/06/15  Yes Albertine Patricia, MD  hydrocortisone cream (PREPARATION H HYDROCORTISONE) 1 % Apply 1 application topically 2 (two) times daily as needed. For hemmoroids.   Yes Historical Provider, MD  losartan (COZAAR) 25 MG tablet Take 25 mg by mouth daily.   Yes Historical Provider, MD  mirabegron ER (MYRBETRIQ) 50 MG TB24 tablet Take 50 mg by mouth daily with breakfast.   Yes Historical Provider, MD  nitroGLYCERIN (NITROSTAT) 0.3 MG SL tablet Place 0.3 mg under the tongue every 5 (five) minutes as needed for chest pain.   Yes Historical Provider, MD  phenytoin (DILANTIN) 100 MG ER capsule Take 200 mg by mouth 2 (two) times daily.   Yes Historical Provider, MD  spironolactone (ALDACTONE) 25 MG tablet Take 25 mg by mouth every morning.   Yes Historical Provider, MD  loperamide (IMODIUM) 2 MG capsule Take 1 capsule (2 mg total) by mouth 4 (four) times daily as needed for diarrhea or loose stools. 02/28/16   Orpah Greek, MD    Family History Family History  Problem Relation Age of Onset  . Colon cancer Neg Hx     Social History Social History  Substance Use Topics  . Smoking status: Former Smoker    Packs/day: 0.00    Years: 50.00    Types: Cigarettes    Quit date: 08/22/2004  . Smokeless tobacco: Never Used  . Alcohol use No     Comment: "quit drinking 1980's"     Allergies   Red dye and Iodinated diagnostic agents   Review of Systems Review of Systems  HENT: Negative for congestion.   Respiratory: Negative for cough and shortness of breath.   Gastrointestinal: Positive for diarrhea (2-3 times today). Negative for vomiting.    Neurological: Negative for dizziness.     Physical Exam Updated Vital Signs BP 119/88   Pulse 89   Temp 98 F (36.7 C) (Oral)   Resp 16   Ht 5\' 8"  (1.727 m)   Wt 154 lb (69.9 kg)   SpO2 95%   BMI 23.42 kg/m   Physical Exam  Constitutional: He is oriented to person,  place, and time. He appears well-developed and well-nourished. No distress.  HENT:  Head: Normocephalic and atraumatic.  Right Ear: Hearing normal.  Left Ear: Hearing normal.  Nose: Nose normal.  Mouth/Throat: Oropharynx is clear and moist and mucous membranes are normal.  Eyes: Conjunctivae and EOM are normal. Pupils are equal, round, and reactive to light.  Neck: Normal range of motion. Neck supple.  Cardiovascular: Regular rhythm, S1 normal and S2 normal.  Exam reveals no gallop and no friction rub.   No murmur heard. Pulmonary/Chest: Effort normal and breath sounds normal. No respiratory distress. He exhibits no tenderness.  Abdominal: Soft. Normal appearance and bowel sounds are normal. There is no hepatosplenomegaly. There is no tenderness. There is no rebound, no guarding, no tenderness at McBurney's point and negative Murphy's sign. No hernia.  Musculoskeletal: Normal range of motion.  Neurological: He is alert and oriented to person, place, and time. He has normal strength. No cranial nerve deficit or sensory deficit. Coordination normal. GCS eye subscore is 4. GCS verbal subscore is 5. GCS motor subscore is 6.  Skin: Skin is warm, dry and intact. No rash noted. No cyanosis.  Psychiatric: He has a normal mood and affect. His speech is normal and behavior is normal. Thought content normal.  Nursing note and vitals reviewed.    ED Treatments / Results  DIAGNOSTIC STUDIES: Oxygen Saturation is 94% on RA, low by my interpretation.    COORDINATION OF CARE: 11:12 PM Discussed treatment plan with pt at bedside and pt agreed to plan.   Labs (all labs ordered are listed, but only abnormal results are  displayed) Labs Reviewed  URINE CULTURE - Abnormal; Notable for the following:       Result Value   Culture 30,000 COLONIES/mL CITROBACTER FREUNDII (*)    Organism ID, Bacteria CITROBACTER FREUNDII (*)    All other components within normal limits  CBC WITH DIFFERENTIAL/PLATELET - Abnormal; Notable for the following:    RBC 4.02 (*)    Hemoglobin 12.1 (*)    HCT 37.0 (*)    All other components within normal limits  COMPREHENSIVE METABOLIC PANEL - Abnormal; Notable for the following:    Potassium 3.2 (*)    Chloride 112 (*)    CO2 20 (*)    Calcium 7.9 (*)    ALT 16 (*)    All other components within normal limits  URINALYSIS, ROUTINE W REFLEX MICROSCOPIC (NOT AT [facility]) - Abnormal; Notable for the following:    Hgb urine dipstick SMALL (*)    Leukocytes, UA MODERATE (*)    All other components within normal limits  URINE MICROSCOPIC-ADD ON - Abnormal; Notable for the following:    Squamous Epithelial / LPF 0-5 (*)    Bacteria, UA FEW (*)    All other components within normal limits  LIPASE, BLOOD    EKG  EKG Interpretation None       Radiology No results found.  Procedures Procedures (including critical care time)  Medications Ordered in ED Medications  sodium chloride 0.9 % bolus 500 mL (0 mLs Intravenous Stopped 02/28/16 0152)  loperamide (IMODIUM) capsule 2 mg (2 mg Oral Given 02/27/16 2344)     Initial Impression / Assessment and Plan / ED Course  I have reviewed the triage vital signs and the nursing notes.  Pertinent labs & imaging results that were available during my care of the patient were reviewed by me and considered in my medical decision making (see chart for details).  Clinical  Course   Patient complained of cramping of his lower abdomen followed by diarrhea earlier. Symptoms have completely resolved before arrival to the ER. Examination is entirely unremarkable. He has no tenderness on examination. Patient is not febrile. White blood cell count is  normal. Patient hydrated and is feeling well. Patient appropriate for discharge and outpatient follow-up with primary care.  Final Clinical Impressions(s) / ED Diagnoses   Final diagnoses:  Diarrhea, unspecified type    New Prescriptions Discharge Medication List as of 02/28/2016  1:55 AM    START taking these medications   Details  loperamide (IMODIUM) 2 MG capsule Take 1 capsule (2 mg total) by mouth 4 (four) times daily as needed for diarrhea or loose stools., Starting Mon 02/28/2016, Print      I personally performed the services described in this documentation, which was scribed in my presence. The recorded information has been reviewed and is accurate.    Orpah Greek, MD 03/09/16 [phone]

## 2016-02-27 NOTE — ED Notes (Signed)
Bed: [id number] Expected date:  Expected time:  Means of arrival:  Comments: 82 yo M/ Abd pain-diarrhea

## 2016-02-27 NOTE — ED Triage Notes (Signed)
Per EMS, pt has been experiencing diarrhea and abdominal pain since yesterday.  Pt states that he has not had any nausea or vomiting and denies dizziness or shob.  Pt comes from [facility].  BP: 140/100 HR: 82 RR: 16 CBG: 108

## 2016-02-28 DIAGNOSIS — R197 Diarrhea, unspecified: Secondary | ICD-10-CM | POA: Diagnosis not present

## 2016-02-28 LAB — COMPREHENSIVE METABOLIC PANEL
ALBUMIN: 3.7 g/dL (ref 3.5–5.0)
ALK PHOS: 73 U/L (ref 38–126)
ALT: 16 U/L — AB (ref 17–63)
AST: 22 U/L (ref 15–41)
Anion gap: 7 (ref 5–15)
BILIRUBIN TOTAL: 0.6 mg/dL (ref 0.3–1.2)
BUN: 17 mg/dL (ref 6–20)
CO2: 20 mmol/L — ABNORMAL LOW (ref 22–32)
CREATININE: 1.06 mg/dL (ref 0.61–1.24)
Calcium: 7.9 mg/dL — ABNORMAL LOW (ref 8.9–10.3)
Chloride: 112 mmol/L — ABNORMAL HIGH (ref 101–111)
GFR calc Af Amer: >60 mL/min (ref >60)
GLUCOSE: 82 mg/dL (ref 65–99)
POTASSIUM: 3.2 mmol/L — AB (ref 3.5–5.1)
Sodium: 139 mmol/L (ref 135–145)
TOTAL PROTEIN: 6.6 g/dL (ref 6.5–8.1)

## 2016-02-28 LAB — URINALYSIS, ROUTINE W REFLEX MICROSCOPIC
BILIRUBIN URINE: NEGATIVE
GLUCOSE, UA: NEGATIVE mg/dL
Ketones, ur: NEGATIVE mg/dL
Nitrite: NEGATIVE
PROTEIN: NEGATIVE mg/dL
Specific Gravity, Urine: 1.009 (ref 1.005–1.030)
pH: 5.5 (ref 5.0–8.0)

## 2016-02-28 LAB — URINE MICROSCOPIC-ADD ON

## 2016-02-28 LAB — LIPASE, BLOOD: Lipase: 19 U/L (ref 11–51)

## 2016-02-28 MED ORDER — LOPERAMIDE HCL 2 MG PO CAPS
2.0000 mg | ORAL_CAPSULE | Freq: Four times a day (QID) | ORAL | 0 refills | Status: AC | PRN
Start: 2016-02-28 — End: ?

## 2016-02-28 NOTE — ED Notes (Signed)
[facility] Called to transfer Patient back to [facility].

## 2016-02-28 NOTE — ED Notes (Signed)
[facility] staff called and report patient condition and discharge.

## 2016-03-01 LAB — URINE CULTURE: Culture: 30000 — AB

## 2016-03-02 ENCOUNTER — Telehealth (HOSPITAL_BASED_OUTPATIENT_CLINIC_OR_DEPARTMENT_OTHER): Payer: Self-pay | Admitting: Emergency Medicine

## 2016-03-02 NOTE — Progress Notes (Signed)
ED Antimicrobial Stewardship Positive Culture Follow Up   [name] is an 82 y.o. male who presented to [facility] on 02/27/2016 with a chief complaint of  Chief Complaint  Patient presents with  . Abdominal Pain  . Diarrhea    Recent Results (from the past 720 hour(s))  Urine culture     Status: Abnormal   Collection Time: 02/27/16 12:15 AM  Result Value Ref Range Status   Specimen Description URINE, CLEAN CATCH  Final   Special Requests NONE  Final   Culture 30,000 COLONIES/mL CITROBACTER FREUNDII (A)  Final   Report Status 03/01/2016 FINAL  Final   Organism ID, Bacteria CITROBACTER FREUNDII (A)  Final      Susceptibility   Citrobacter freundii - MIC*    CEFAZOLIN >=64 RESISTANT Resistant     CEFTRIAXONE >=64 RESISTANT Resistant     CIPROFLOXACIN >=4 RESISTANT Resistant     GENTAMICIN <=1 SENSITIVE Sensitive     IMIPENEM 0.5 SENSITIVE Sensitive     NITROFURANTOIN <=16 SENSITIVE Sensitive     TRIMETH/SULFA <=20 SENSITIVE Sensitive     PIP/TAZO >=128 RESISTANT Resistant     * 30,000 COLONIES/mL CITROBACTER FREUNDII    [x]  Patient discharged originally without antimicrobial agent. However, patient is asymptomatic and treatment is not indicated at this time.   ED Provider: Petra Kuba, PA-C  Belia Heman, PharmD PGY1 Pharmacy Resident [phone] (Pager) 03/02/2016 9:41 AM

## 2016-03-02 NOTE — Telephone Encounter (Signed)
Post ED Visit - Positive Culture Follow-up  Culture report reviewed by antimicrobial stewardship pharmacist:  []  Elenor Quinones, Pharm.D. []  Heide Guile, Pharm.D., BCPS []  Parks Neptune, Pharm.D. []  Alycia Rossetti, Pharm.D., BCPS []  Blanco, Pharm.D., BCPS, AAHIVP []  Legrand Como, Pharm.D., BCPS, AAHIVP []  Milus Glazier, Pharm.D. []  Stephens November, Pharm.D. Apryl Ouida Sills PharmD  Positive urine culture Treated with none, asymptomatic, and no further patient follow-up is required at this time.  Hazle Nordmann 03/02/2016, 11:57 AM

## 2016-05-17 ENCOUNTER — Encounter (HOSPITAL_COMMUNITY): Payer: Self-pay | Admitting: Emergency Medicine

## 2016-05-17 ENCOUNTER — Inpatient Hospital Stay (HOSPITAL_COMMUNITY)
Admission: EM | Admit: 2016-05-17 | Discharge: 2016-05-22 | DRG: 293 | Disposition: A | Discharge status: Nursing Home (Includes ICF) | Payer: Medicare Other | Attending: Internal Medicine | Admitting: Internal Medicine

## 2016-05-17 ENCOUNTER — Emergency Department (HOSPITAL_COMMUNITY): Payer: Medicare Other

## 2016-05-17 DIAGNOSIS — R0902 Hypoxemia: Secondary | ICD-10-CM | POA: Diagnosis present

## 2016-05-17 DIAGNOSIS — Z66 Do not resuscitate: Secondary | ICD-10-CM | POA: Diagnosis present

## 2016-05-17 DIAGNOSIS — I5043 Acute on chronic combined systolic (congestive) and diastolic (congestive) heart failure: Secondary | ICD-10-CM | POA: Diagnosis present

## 2016-05-17 DIAGNOSIS — Z79899 Other long term (current) drug therapy: Secondary | ICD-10-CM

## 2016-05-17 DIAGNOSIS — F0390 Unspecified dementia without behavioral disturbance: Secondary | ICD-10-CM | POA: Diagnosis present

## 2016-05-17 DIAGNOSIS — I110 Hypertensive heart disease with heart failure: Secondary | ICD-10-CM | POA: Diagnosis not present

## 2016-05-17 DIAGNOSIS — Z955 Presence of coronary angioplasty implant and graft: Secondary | ICD-10-CM

## 2016-05-17 DIAGNOSIS — I252 Old myocardial infarction: Secondary | ICD-10-CM

## 2016-05-17 DIAGNOSIS — N400 Enlarged prostate without lower urinary tract symptoms: Secondary | ICD-10-CM | POA: Diagnosis present

## 2016-05-17 DIAGNOSIS — Z91041 Radiographic dye allergy status: Secondary | ICD-10-CM

## 2016-05-17 DIAGNOSIS — E7800 Pure hypercholesterolemia, unspecified: Secondary | ICD-10-CM | POA: Diagnosis present

## 2016-05-17 DIAGNOSIS — I447 Left bundle-branch block, unspecified: Secondary | ICD-10-CM | POA: Diagnosis present

## 2016-05-17 DIAGNOSIS — Z7982 Long term (current) use of aspirin: Secondary | ICD-10-CM

## 2016-05-17 DIAGNOSIS — Z7951 Long term (current) use of inhaled steroids: Secondary | ICD-10-CM

## 2016-05-17 DIAGNOSIS — J449 Chronic obstructive pulmonary disease, unspecified: Secondary | ICD-10-CM | POA: Diagnosis present

## 2016-05-17 DIAGNOSIS — I509 Heart failure, unspecified: Secondary | ICD-10-CM

## 2016-05-17 DIAGNOSIS — R0602 Shortness of breath: Secondary | ICD-10-CM

## 2016-05-17 DIAGNOSIS — Z9049 Acquired absence of other specified parts of digestive tract: Secondary | ICD-10-CM

## 2016-05-17 DIAGNOSIS — I2510 Atherosclerotic heart disease of native coronary artery without angina pectoris: Secondary | ICD-10-CM | POA: Diagnosis present

## 2016-05-17 DIAGNOSIS — R600 Localized edema: Secondary | ICD-10-CM

## 2016-05-17 DIAGNOSIS — E8770 Fluid overload, unspecified: Secondary | ICD-10-CM

## 2016-05-17 DIAGNOSIS — Z87891 Personal history of nicotine dependence: Secondary | ICD-10-CM

## 2016-05-17 DIAGNOSIS — H548 Legal blindness, as defined in USA: Secondary | ICD-10-CM | POA: Diagnosis present

## 2016-05-17 DIAGNOSIS — G40909 Epilepsy, unspecified, not intractable, without status epilepticus: Secondary | ICD-10-CM | POA: Diagnosis present

## 2016-05-17 LAB — BASIC METABOLIC PANEL
ANION GAP: 7 (ref 5–15)
BUN: 17 mg/dL (ref 6–20)
CALCIUM: 9 mg/dL (ref 8.9–10.3)
CHLORIDE: 110 mmol/L (ref 101–111)
CO2: 23 mmol/L (ref 22–32)
Creatinine, Ser: 1.12 mg/dL (ref 0.61–1.24)
GFR calc non Af Amer: 60 mL/min — ABNORMAL LOW (ref >60)
GLUCOSE: 93 mg/dL (ref 65–99)
POTASSIUM: 3.8 mmol/L (ref 3.5–5.1)
Sodium: 140 mmol/L (ref 135–145)

## 2016-05-17 LAB — CBC WITH DIFFERENTIAL/PLATELET
BASOS ABS: 0.1 10*3/uL (ref 0.0–0.1)
Basophils Relative: 1 %
Eosinophils Absolute: 0.4 10*3/uL (ref 0.0–0.7)
Eosinophils Relative: 5 %
HEMATOCRIT: 38.9 % — AB (ref 39.0–52.0)
Hemoglobin: 13.2 g/dL (ref 13.0–17.0)
LYMPHS ABS: 1.1 10*3/uL (ref 0.7–4.0)
LYMPHS PCT: 14 %
MCH: 30.4 pg (ref 26.0–34.0)
MCHC: 33.9 g/dL (ref 30.0–36.0)
MCV: 89.6 fL (ref 78.0–100.0)
Monocytes Absolute: 1.2 10*3/uL — ABNORMAL HIGH (ref 0.1–1.0)
Monocytes Relative: 16 %
NEUTROS ABS: 4.9 10*3/uL (ref 1.7–7.7)
Neutrophils Relative %: 64 %
PLATELETS: 235 10*3/uL (ref 150–400)
RBC: 4.34 MIL/uL (ref 4.22–5.81)
RDW: 13.1 % (ref 11.5–15.5)
WBC: 7.6 10*3/uL (ref 4.0–10.5)

## 2016-05-17 LAB — TROPONIN I: Troponin I: 0.04 ng/mL (ref <0.03)

## 2016-05-17 LAB — BRAIN NATRIURETIC PEPTIDE: B Natriuretic Peptide: 1422.4 pg/mL — ABNORMAL HIGH (ref 0.0–100.0)

## 2016-05-17 MED ORDER — FUROSEMIDE 10 MG/ML IJ SOLN
20.0000 mg | Freq: Once | INTRAMUSCULAR | Status: DC
  Administered 2016-05-18: 20 mg via INTRAVENOUS
  Filled 2016-05-17: qty 4

## 2016-05-17 MED ORDER — ALBUTEROL SULFATE (2.5 MG/3ML) 0.083% IN NEBU
5.0000 mg | INHALATION_SOLUTION | Freq: Once | RESPIRATORY_TRACT | Status: AC
  Administered 2016-05-17: 5 mg via RESPIRATORY_TRACT
  Filled 2016-05-17: qty 6

## 2016-05-17 NOTE — ED Triage Notes (Signed)
Pt reports shortness of breath x 3 days. Cough. No fever. Nasal congestion . Denies chest pain.

## 2016-05-17 NOTE — ED Provider Notes (Signed)
[facility] DEPT Provider Note   CSN: [id number] Arrival date & time: 05/17/16  1706     History   Chief Complaint Chief Complaint  Patient presents with  . Shortness of Breath    HPI [name] is a 83 y.o. male with a past medical history significant for hypertension, CAD with MI, COPD, seizures, CHF on Lasix, and dementia who presents with worsening shortness of breath, congestion, cough, and bilateral lower extremity edema. Patient reports that over the last week, he has had gradually worsening shortness of breath that has caused him to when did after he does anything exertional. He says he has having no chest pain or palpitations. He reports a dry cough. Patient says that his lower extremity edema is in both legs and has been gradually worsening. He denies nausea, vomiting, constipation does report some mild diarrhea. He denies any changes in urination. He says that he has been using his Lasix as prescribed. He denies any chest trauma or other symptoms.  HPI  Past Medical History:  Diagnosis Date  . Acute MI Dec 2010   cardiac catheriziation and stenting of the LAD (bare metal) 03/14/09              . Anemia   . Angina   . Blood transfusion   . CAD (coronary artery disease)   . COPD (chronic obstructive pulmonary disease) ([place])   . DEMENTIA   . High cholesterol   . Hypertension   . LV dysfunction    EF 25 to 30% per echo May 2012  . NSTEMI (non-ST elevated myocardial infarction) [facility]) Nov 2011   with LV dysfunction  . Prostatic hypertrophy   . Seizures ([place]) ~ 2006   "when blood pressure went up too high"; on Dilantin  . Urethral stricture    with subsequent trauma and has an indwelling Foley catheter in place during his first MI                                                 . Urinary catheter in place    chronic  . Vision impairment    "legally blind"    Patient Active Problem List   Diagnosis Date Noted  . Hypoxia 12/03/2015  . CHF (congestive heart  failure) ([place]) 12/03/2015  . Rectal bleeding 07/01/2015  . UTI (lower urinary tract infection) 03/07/2012  . Lower GI bleed = suspect diverticular 03/06/2012  . Chest pain, unspecified 05/11/2011  . Preop cardiovascular exam 12/07/2010  . CAD (coronary artery disease) 08/24/2010  . Hypertension   . Seizure disorder ([place])   . Anemia   . LV dysfunction   . NSTEMI (non-ST elevated myocardial infarction) [facility])     Past Surgical History:  Procedure Laterality Date  . CARDIAC CATHETERIZATION  02/2010  . 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 CHOLECYSTECTOMY  05/30/11  . CHOLECYSTECTOMY  05/30/2011   Procedure: LAPAROSCOPIC CHOLECYSTECTOMY;  Surgeon: Judieth Keens, DO;  Location: [facility];  Service: General;  Laterality: N/A;  . CORONARY ANGIOPLASTY WITH STENT PLACEMENT  03/2009   "1"; stent to LAD  . TONSILLECTOMY     "when I was small"       Home Medications    Prior to Admission medications   Medication Sig Start Date End  Date Taking? Authorizing Provider  acetaminophen (TYLENOL) 500 MG tablet Take 1,000 mg by mouth every 8 (eight) hours as needed for moderate pain.   Yes Historical Provider, MD  aspirin 81 MG chewable tablet Chew 81 mg by mouth daily.   Yes Historical Provider, MD  atorvastatin (LIPITOR) 10 MG tablet Take 10 mg by mouth daily.   Yes Historical Provider, MD  benzocaine (ORAJEL MOUTH-AID) 20 % GEL Use as directed 1 application in the mouth or throat 4 (four) times daily as needed (gum pain).   Yes Historical Provider, MD  bismuth subsalicylate (PEPTO BISMOL) 262 MG/15ML suspension Take 30 mLs by mouth as needed for diarrhea or loose stools.    Yes Historical Provider, MD  carvedilol (COREG) 6.25 MG tablet Take 6.25 mg by mouth 2 (two) times daily with a meal.   Yes Historical Provider, MD  cholecalciferol (VITAMIN D) 1000 UNITS tablet Take 1,000 Units by mouth daily.   Yes Historical Provider, MD  dextromethorphan  (DELSYM) 30 MG/5ML liquid Take 10 mLs by mouth as needed for cough.   Yes Historical Provider, MD  donepezil (ARICEPT) 10 MG tablet Take 10 mg by mouth at bedtime.   Yes Historical Provider, MD  Fluticasone Furoate-Vilanterol 100-25 MCG/INH AEPB Inhale 1 puff into the lungs daily with breakfast.   Yes Historical Provider, MD  furosemide (LASIX) 40 MG tablet Take 1 tablet (40 mg total) by mouth daily. 12/06/15  Yes Albertine Patricia, MD  hydrocortisone cream (PREPARATION H HYDROCORTISONE) 1 % Apply 1 application topically 2 (two) times daily as needed. For hemmoroids.   Yes Historical Provider, MD  losartan (COZAAR) 25 MG tablet Take 25 mg by mouth daily.   Yes Historical Provider, MD  mirabegron ER (MYRBETRIQ) 50 MG TB24 tablet Take 50 mg by mouth daily with breakfast.   Yes Historical Provider, MD  nitroGLYCERIN (NITROSTAT) 0.3 MG SL tablet Place 0.3 mg under the tongue every 5 (five) minutes as needed for chest pain.   Yes Historical Provider, MD  phenytoin (DILANTIN) 100 MG ER capsule Take 200 mg by mouth 2 (two) times daily.   Yes Historical Provider, MD  spironolactone (ALDACTONE) 25 MG tablet Take 25 mg by mouth every morning.   Yes Historical Provider, MD  loperamide (IMODIUM) 2 MG capsule Take 1 capsule (2 mg total) by mouth 4 (four) times daily as needed for diarrhea or loose stools. Patient not taking: Reported on 05/17/2016 02/28/16   Orpah Greek, MD    Family History Family History  Problem Relation Age of Onset  . Colon cancer Neg Hx     Social History Social History  Substance Use Topics  . Smoking status: Former Smoker    Packs/day: 0.00    Years: 50.00    Types: Cigarettes    Quit date: 08/22/2004  . Smokeless tobacco: Never Used  . Alcohol use No     Comment: "quit drinking 1980's"     Allergies   Red dye and Iodinated diagnostic agents   Review of Systems Review of Systems  Constitutional: Negative for activity change, chills, diaphoresis, fatigue and  fever.  HENT: Positive for congestion and rhinorrhea.   Eyes: Negative for visual disturbance.  Respiratory: Positive for cough and shortness of breath. Negative for chest tightness, wheezing and stridor.   Cardiovascular: Positive for leg swelling. Negative for chest pain and palpitations.  Gastrointestinal: Negative for abdominal distention, abdominal pain, blood in stool, constipation, diarrhea, nausea and vomiting.  Genitourinary: Negative for difficulty urinating, dysuria  and flank pain.  Musculoskeletal: Negative for back pain and gait problem.  Skin: Negative for rash and wound.  Neurological: Negative for dizziness, weakness, light-headedness and headaches.  Psychiatric/Behavioral: Negative for agitation.  All other systems reviewed and are negative.    Physical Exam Updated Vital Signs BP 132/98   Pulse 79   Temp 97.7 F (36.5 C) (Oral)   Resp 19   SpO2 95%   Physical Exam  Constitutional: He is oriented to person, place, and time. He appears well-developed and well-nourished. No distress.  HENT:  Head: Normocephalic and atraumatic.  Right Ear: External ear normal.  Left Ear: External ear normal.  Nose: Nose normal.  Mouth/Throat: Oropharynx is clear and moist. No oropharyngeal exudate.  Eyes: Conjunctivae and EOM are normal. Pupils are equal, round, and reactive to light.  Neck: Normal range of motion. Neck supple.  Cardiovascular: Normal rate, normal heart sounds and intact distal pulses.   Pulmonary/Chest: No stridor. Tachypnea noted. No respiratory distress. He has no wheezes. He has rales. He exhibits no tenderness.  Abdominal: Soft. There is no tenderness. There is no rebound and no guarding.  Musculoskeletal:       Right lower leg: He exhibits edema.       Left lower leg: He exhibits edema.  Neurological: He is alert and oriented to person, place, and time. He displays normal reflexes. No cranial nerve deficit. He exhibits normal muscle tone. Coordination  normal.  Skin: Skin is warm. No rash noted. He is not diaphoretic. No erythema.     ED Treatments / Results  Labs (all labs ordered are listed, but only abnormal results are displayed) Labs Reviewed  CBC WITH DIFFERENTIAL/PLATELET - Abnormal; Notable for the following:       Result Value   HCT 38.9 (*)    Monocytes Absolute 1.2 (*)    All other components within normal limits  BRAIN NATRIURETIC PEPTIDE - Abnormal; Notable for the following:    B Natriuretic Peptide 1,422.4 (*)    All other components within normal limits  BASIC METABOLIC PANEL - Abnormal; Notable for the following:    GFR calc non Af Amer 60 (*)    All other components within normal limits  TROPONIN I - Abnormal; Notable for the following:    Troponin I 0.04 (*)    All other components within normal limits  TROPONIN I  TROPONIN I  TROPONIN I    EKG  EKG Interpretation  Date/Time:  Wednesday May 17 2016 17:23:45 EST Ventricular Rate:  79 PR Interval:    QRS Duration: 114 QT Interval:  400 QTC Calculation: 459 R Axis:   19 Text Interpretation:  Sinus rhythm Paired ventricular premature complexes Prolonged PR interval Incomplete left bundle branch block Probable left ventricular hypertrophy No STEMI Confirmed by Sherry Ruffing MD, Arrionna Serena [phone]) on 05/17/2016 8:56:29 PM       Radiology Dg Chest 2 View  Result Date: 05/17/2016 CLINICAL DATA:  Dyspnea x3 days with cough.  No fever. EXAM: CHEST  2 VIEW COMPARISON:  12/04/2015 CXR FINDINGS: Stable cardiomegaly with tortuous thoracic aorta. Mild interstitial edema. Faint subpleural areas of interstitial fibrosis bilaterally especially at the lung bases. Trace edema along the minor fissure with evolving atelectatic change. Old right-sided rib fractures. No acute nor suspicious osseous abnormality. No pneumothorax or effusion. IMPRESSION: Cardiomegaly with mild interstitial edema since prior exam. Trace edema/fluid in the minor fissure with adjacent area  bandlike atelectasis. Electronically Signed   By: Meredith Leeds.D.  On: 05/17/2016 17:50    Procedures Procedures (including critical care time)  Medications Ordered in ED Medications  carvedilol (COREG) tablet 6.25 mg (not administered)  acetaminophen (TYLENOL) tablet 1,000 mg (not administered)  benzocaine (HURRICAINE) 20 % jelly 1 application (not administered)  aspirin chewable tablet 81 mg (not administered)  dextromethorphan (DELSYM) 30 MG/5ML liquid 60 mg (not administered)  fluticasone furoate-vilanterol (BREO ELLIPTA) 100-25 MCG/INH 1 puff (not administered)  mirabegron ER (MYRBETRIQ) tablet 50 mg (not administered)  donepezil (ARICEPT) tablet 10 mg (not administered)  spironolactone (ALDACTONE) tablet 25 mg (not administered)  atorvastatin (LIPITOR) tablet 10 mg (not administered)  cholecalciferol (VITAMIN D) tablet 1,000 Units (not administered)  phenytoin (DILANTIN) ER capsule 200 mg (not administered)  sodium chloride flush (NS) 0.9 % injection 3 mL (not administered)  sodium chloride flush (NS) 0.9 % injection 3 mL (not administered)  0.9 %  sodium chloride infusion (not administered)  ondansetron (ZOFRAN) injection 4 mg (not administered)  enoxaparin (LOVENOX) injection 40 mg (not administered)  potassium chloride SA (K-DUR,KLOR-CON) CR tablet 20 mEq (not administered)  albuterol (PROVENTIL) (2.5 MG/3ML) 0.083% nebulizer solution 5 mg (5 mg Nebulization Given 05/17/16 2058)     Initial Impression / Assessment and Plan / ED Course  I have reviewed the triage vital signs and the nursing notes.  Pertinent labs & imaging results that were available during my care of the patient were reviewed by me and considered in my medical decision making (see chart for details).     [name] is a 83 y.o. male with a past medical history significant for hypertension, CAD with MI, COPD, seizures, CHF on Lasix, and dementia who presents with worsening shortness of breath,  congestion, cough, and bilateral lower extremity edema.  History and exam are seen above.  On exam, patient has crackles in both lung bases concerning for fluid. Patient's abdomen and chest were nontender. Patient had edema in his lower extremities that was 2+ and up to his knees. Patient had no focal neurologic deficits.  Patient's diagnostic laboratory testing showed elevation in BNP up to 1400 increased from previous value at 700. Initial troponin was the same as prior at 0.04. BNP showed normal kidney function and normal electrolyte. CBC showed no evidence of leukocytosis or anemia.  Chest x-ray confirmed fluid overload on exam with increase in interstitial edema since prior exam.  Given patient's clinical parents and his new oxygen requirement of 2 L, patient will be given Lasix and admitted for fluid overload and CHF exacerbation causing his shortness of breath.  Hospitalist team was called and agreed with admission. Patient admitted in stable condition for further management.    Final Clinical Impressions(s) / ED Diagnoses   Final diagnoses:  Hypervolemia, unspecified hypervolemia type  Acute on chronic congestive heart failure, unspecified congestive heart failure type (HCC)  SOB (shortness of breath)  Bilateral leg edema     Clinical Impression: 1. Hypervolemia, unspecified hypervolemia type   2. Acute on chronic congestive heart failure, unspecified congestive heart failure type ([place])   3. SOB (shortness of breath)   4. Bilateral leg edema     Disposition: Admit to hospitalist service    Courtney Paris, MD 05/18/16 1155

## 2016-05-18 ENCOUNTER — Inpatient Hospital Stay (HOSPITAL_COMMUNITY): Payer: Medicare Other

## 2016-05-18 DIAGNOSIS — Z7982 Long term (current) use of aspirin: Secondary | ICD-10-CM | POA: Diagnosis not present

## 2016-05-18 DIAGNOSIS — N400 Enlarged prostate without lower urinary tract symptoms: Secondary | ICD-10-CM | POA: Diagnosis present

## 2016-05-18 DIAGNOSIS — R0602 Shortness of breath: Secondary | ICD-10-CM | POA: Diagnosis present

## 2016-05-18 DIAGNOSIS — R0600 Dyspnea, unspecified: Secondary | ICD-10-CM

## 2016-05-18 DIAGNOSIS — E785 Hyperlipidemia, unspecified: Secondary | ICD-10-CM | POA: Diagnosis not present

## 2016-05-18 DIAGNOSIS — Z66 Do not resuscitate: Secondary | ICD-10-CM | POA: Diagnosis present

## 2016-05-18 DIAGNOSIS — I252 Old myocardial infarction: Secondary | ICD-10-CM | POA: Diagnosis not present

## 2016-05-18 DIAGNOSIS — R0902 Hypoxemia: Secondary | ICD-10-CM | POA: Diagnosis not present

## 2016-05-18 DIAGNOSIS — E7800 Pure hypercholesterolemia, unspecified: Secondary | ICD-10-CM | POA: Diagnosis present

## 2016-05-18 DIAGNOSIS — E8770 Fluid overload, unspecified: Secondary | ICD-10-CM | POA: Diagnosis not present

## 2016-05-18 DIAGNOSIS — I10 Essential (primary) hypertension: Secondary | ICD-10-CM | POA: Diagnosis not present

## 2016-05-18 DIAGNOSIS — I447 Left bundle-branch block, unspecified: Secondary | ICD-10-CM | POA: Diagnosis present

## 2016-05-18 DIAGNOSIS — F0390 Unspecified dementia without behavioral disturbance: Secondary | ICD-10-CM | POA: Diagnosis present

## 2016-05-18 DIAGNOSIS — G40909 Epilepsy, unspecified, not intractable, without status epilepticus: Secondary | ICD-10-CM | POA: Diagnosis not present

## 2016-05-18 DIAGNOSIS — I2510 Atherosclerotic heart disease of native coronary artery without angina pectoris: Secondary | ICD-10-CM | POA: Diagnosis not present

## 2016-05-18 DIAGNOSIS — Z87891 Personal history of nicotine dependence: Secondary | ICD-10-CM | POA: Diagnosis not present

## 2016-05-18 DIAGNOSIS — Z79899 Other long term (current) drug therapy: Secondary | ICD-10-CM | POA: Diagnosis not present

## 2016-05-18 DIAGNOSIS — R600 Localized edema: Secondary | ICD-10-CM | POA: Diagnosis not present

## 2016-05-18 DIAGNOSIS — Z91041 Radiographic dye allergy status: Secondary | ICD-10-CM | POA: Diagnosis not present

## 2016-05-18 DIAGNOSIS — Z9049 Acquired absence of other specified parts of digestive tract: Secondary | ICD-10-CM | POA: Diagnosis not present

## 2016-05-18 DIAGNOSIS — J449 Chronic obstructive pulmonary disease, unspecified: Secondary | ICD-10-CM | POA: Diagnosis present

## 2016-05-18 DIAGNOSIS — Z7951 Long term (current) use of inhaled steroids: Secondary | ICD-10-CM | POA: Diagnosis not present

## 2016-05-18 DIAGNOSIS — I110 Hypertensive heart disease with heart failure: Secondary | ICD-10-CM | POA: Diagnosis present

## 2016-05-18 DIAGNOSIS — Z955 Presence of coronary angioplasty implant and graft: Secondary | ICD-10-CM | POA: Diagnosis not present

## 2016-05-18 DIAGNOSIS — H548 Legal blindness, as defined in USA: Secondary | ICD-10-CM | POA: Diagnosis present

## 2016-05-18 DIAGNOSIS — I5043 Acute on chronic combined systolic (congestive) and diastolic (congestive) heart failure: Secondary | ICD-10-CM | POA: Diagnosis not present

## 2016-05-18 LAB — ECHOCARDIOGRAM COMPLETE
Height: 68 in
WEIGHTICAEL: 2310.42 [oz_av]

## 2016-05-18 LAB — TROPONIN I
Troponin I: 0.04 ng/mL (ref <0.03)
Troponin I: 0.04 ng/mL (ref <0.03)
Troponin I: 0.04 ng/mL (ref <0.03)

## 2016-05-18 LAB — MRSA PCR SCREENING: MRSA BY PCR: NEGATIVE

## 2016-05-18 MED ORDER — FLUTICASONE FUROATE-VILANTEROL 100-25 MCG/INH IN AEPB
1.0000 | INHALATION_SPRAY | Freq: Every day | RESPIRATORY_TRACT | Status: DC
  Administered 2016-05-18 – 2016-05-22 (×5): 1 via RESPIRATORY_TRACT
  Filled 2016-05-18: qty 28

## 2016-05-18 MED ORDER — ENOXAPARIN SODIUM 40 MG/0.4ML ~~LOC~~ SOLN
40.0000 mg | Freq: Every day | SUBCUTANEOUS | Status: DC
  Administered 2016-05-18 – 2016-05-21 (×5): 40 mg via SUBCUTANEOUS
  Filled 2016-05-18 (×5): qty 0.4

## 2016-05-18 MED ORDER — SODIUM CHLORIDE 0.9% FLUSH: 3.0000 mL | INTRAVENOUS | Status: DC | PRN

## 2016-05-18 MED ORDER — FUROSEMIDE 10 MG/ML IJ SOLN
20.0000 mg | Freq: Every day | INTRAMUSCULAR | Status: DC
  Administered 2016-05-18: 20 mg via INTRAVENOUS

## 2016-05-18 MED ORDER — SODIUM CHLORIDE 0.9% FLUSH
3.0000 mL | Freq: Two times a day (BID) | INTRAVENOUS | Status: DC
  Administered 2016-05-18 – 2016-05-22 (×9): 3 mL via INTRAVENOUS

## 2016-05-18 MED ORDER — DONEPEZIL HCL 5 MG PO TABS
10.0000 mg | ORAL_TABLET | Freq: Every day | ORAL | Status: DC
  Administered 2016-05-18 – 2016-05-21 (×5): 10 mg via ORAL
  Filled 2016-05-18 (×5): qty 2

## 2016-05-18 MED ORDER — DEXTROMETHORPHAN POLISTIREX ER 30 MG/5ML PO SUER
10.0000 mL | ORAL | Status: DC | PRN
  Filled 2016-05-18: qty 10

## 2016-05-18 MED ORDER — SODIUM CHLORIDE 0.9 % IV SOLN: 250.0000 mL | INTRAVENOUS | Status: DC | PRN

## 2016-05-18 MED ORDER — FUROSEMIDE 10 MG/ML IJ SOLN: 40.0000 mg | Freq: Every day | INTRAMUSCULAR | Status: DC

## 2016-05-18 MED ORDER — POTASSIUM CHLORIDE CRYS ER 20 MEQ PO TBCR
20.0000 meq | EXTENDED_RELEASE_TABLET | Freq: Two times a day (BID) | ORAL | Status: DC
  Administered 2016-05-18 – 2016-05-22 (×10): 20 meq via ORAL
  Filled 2016-05-18 (×10): qty 1

## 2016-05-18 MED ORDER — VITAMIN D3 25 MCG (1000 UNIT) PO TABS
1000.0000 [IU] | ORAL_TABLET | Freq: Every day | ORAL | Status: DC
  Administered 2016-05-18 – 2016-05-22 (×5): 1000 [IU] via ORAL
  Filled 2016-05-18 (×5): qty 1

## 2016-05-18 MED ORDER — ASPIRIN 81 MG PO CHEW
81.0000 mg | CHEWABLE_TABLET | Freq: Every day | ORAL | Status: DC
  Administered 2016-05-18 – 2016-05-22 (×5): 81 mg via ORAL
  Filled 2016-05-18 (×5): qty 1

## 2016-05-18 MED ORDER — BENZOCAINE 20 % MT GEL
1.0000 "application " | Freq: Four times a day (QID) | OROMUCOSAL | Status: DC | PRN
  Filled 2016-05-18: qty 30

## 2016-05-18 MED ORDER — FUROSEMIDE 10 MG/ML IJ SOLN: 20.0000 mg | Freq: Every day | INTRAMUSCULAR | Status: DC

## 2016-05-18 MED ORDER — SPIRONOLACTONE 25 MG PO TABS
25.0000 mg | ORAL_TABLET | Freq: Every morning | ORAL | Status: DC
  Administered 2016-05-18 – 2016-05-22 (×5): 25 mg via ORAL
  Filled 2016-05-18 (×5): qty 1

## 2016-05-18 MED ORDER — ACETAMINOPHEN 500 MG PO TABS: 1000.0000 mg | ORAL_TABLET | Freq: Three times a day (TID) | ORAL | Status: DC | PRN

## 2016-05-18 MED ORDER — FUROSEMIDE 10 MG/ML IJ SOLN
40.0000 mg | Freq: Every day | INTRAMUSCULAR | Status: DC
  Administered 2016-05-19 – 2016-05-22 (×4): 40 mg via INTRAVENOUS
  Filled 2016-05-18 (×4): qty 4

## 2016-05-18 MED ORDER — CARVEDILOL 6.25 MG PO TABS
6.2500 mg | ORAL_TABLET | Freq: Two times a day (BID) | ORAL | Status: DC
  Administered 2016-05-18 – 2016-05-22 (×8): 6.25 mg via ORAL
  Filled 2016-05-18 (×9): qty 1

## 2016-05-18 MED ORDER — MIRABEGRON ER 50 MG PO TB24
50.0000 mg | ORAL_TABLET | Freq: Every day | ORAL | Status: DC
  Administered 2016-05-18 – 2016-05-22 (×5): 50 mg via ORAL
  Filled 2016-05-18 (×6): qty 1

## 2016-05-18 MED ORDER — ATORVASTATIN CALCIUM 10 MG PO TABS
10.0000 mg | ORAL_TABLET | Freq: Every day | ORAL | Status: DC
  Administered 2016-05-18 – 2016-05-22 (×5): 10 mg via ORAL
  Filled 2016-05-18 (×5): qty 1

## 2016-05-18 MED ORDER — ONDANSETRON HCL 4 MG/2ML IJ SOLN: 4.0000 mg | Freq: Four times a day (QID) | INTRAMUSCULAR | Status: DC | PRN

## 2016-05-18 MED ORDER — PHENYTOIN SODIUM EXTENDED 100 MG PO CAPS
200.0000 mg | ORAL_CAPSULE | Freq: Two times a day (BID) | ORAL | Status: DC
  Administered 2016-05-18 – 2016-05-22 (×9): 200 mg via ORAL
  Filled 2016-05-18 (×9): qty 2

## 2016-05-18 NOTE — H&P (Signed)
History and Physical    [name] [name] DOB: 09-11-1932 DOA: 05/17/2016  PCP: Tivis Ringer, MD   Patient coming from: Nursing home  Chief Complaint: Cough, dyspnea   HPI: [name] is a 83 y.o. male with medical history significant for COPD, coronary artery disease, chronic combined CHF, dementia, hypertension, seizure disorder, urethral stricture with chronic indwelling Foley catheter, and blindness who presents from his nursing facility for evaluation of progressive cough and dyspnea over the past 3 days. Patient reports being in his usual state of health until approximately 3 days ago when he noted the insidious development of a cough and dyspnea with exertion. There has been no fevers or chills and no chest pain or palpitations. He reports his cough to be productive and his stepdaughter at the bedside and notes that sputum has been green and brownish. Patient denies rhinorrhea, sore throat, aches, or malaise. He reports that for the past 2 days, he has been getting short of breath while playing the piano and just prior to arrival was dyspneic while at rest. Given his blindness, he is uncertain about increased swelling.  ED Course: Upon arrival to the ED, patient is found to be afebrile, requiring supplemental oxygen to maintain O2 saturations in the 90s, and with vitals otherwise stable. EKG features a sinus rhythm with PVCs and an incomplete left bundle branch block. Chest x-ray features cardiomegaly and mild interstitial edema, as well as trace edema in the minor fissures. Chemistry panel is unremarkable, CBC is also essentially normal, BNP is elevated to a value of 1422, nearly doubled from several months ago, and troponin is borderline elevated at 0.04. Patient was given a 5 mg albuterol neb treatment in the ED and an IVP Lasix 40 mg. He has had improvement in work of breathing with these measures in the emergency department, but continues to require supplemental oxygen  and will be admitted to the telemetry unit for ongoing evaluation and management of acute on chronic combined systolic/diastolic CHF.  Review of Systems:  All other systems reviewed and apart from HPI, are negative.  Past Medical History:  Diagnosis Date  . Acute MI Dec 2010   cardiac catheriziation and stenting of the LAD (bare metal) 03/14/09              . Anemia   . Angina   . Blood transfusion   . CAD (coronary artery disease)   . COPD (chronic obstructive pulmonary disease) ([place])   . DEMENTIA   . High cholesterol   . Hypertension   . LV dysfunction    EF 25 to 30% per echo May 2012  . NSTEMI (non-ST elevated myocardial infarction) [facility]) Nov 2011   with LV dysfunction  . Prostatic hypertrophy   . Seizures ([place]) ~ 2006   "when blood pressure went up too high"; on Dilantin  . Urethral stricture    with subsequent trauma and has an indwelling Foley catheter in place during his first MI                                                 . Urinary catheter in place    chronic  . Vision impairment    "legally blind"    Past Surgical History:  Procedure Laterality Date  . CARDIAC CATHETERIZATION  02/2010  . CATARACT EXTRACTION     "  both eyes; when I was small; I was born w/cataracrs"  . CHOLECYSTECTOMY  05/30/11  . CHOLECYSTECTOMY  05/30/2011   Procedure: LAPAROSCOPIC CHOLECYSTECTOMY;  Surgeon: Judieth Keens, DO;  Location: [facility];  Service: General;  Laterality: N/A;  . CORONARY ANGIOPLASTY WITH STENT PLACEMENT  03/2009   "1"; stent to LAD  . TONSILLECTOMY     "when I was small"     reports that he quit smoking about 11 years ago. His smoking use included Cigarettes. He smoked 0.00 packs per day for 50.00 years. He has never used smokeless tobacco. He reports that he does not drink alcohol or use drugs.  Allergies  Allergen Reactions  . Red Dye Other (See Comments)    Per Mar.  . Iodinated Diagnostic Agents Rash    Pt given 50 mg Benadryl prior to Omnipaque  injection, tolerated well.    Family History  Problem Relation Age of Onset  . Colon cancer Neg Hx      Prior to Admission medications   Medication Sig Start Date End Date Taking? Authorizing Provider  acetaminophen (TYLENOL) 500 MG tablet Take 1,000 mg by mouth every 8 (eight) hours as needed for moderate pain.   Yes Historical Provider, MD  aspirin 81 MG chewable tablet Chew 81 mg by mouth daily.   Yes Historical Provider, MD  atorvastatin (LIPITOR) 10 MG tablet Take 10 mg by mouth daily.   Yes Historical Provider, MD  benzocaine (ORAJEL MOUTH-AID) 20 % GEL Use as directed 1 application in the mouth or throat 4 (four) times daily as needed (gum pain).   Yes Historical Provider, MD  bismuth subsalicylate (PEPTO BISMOL) 262 MG/15ML suspension Take 30 mLs by mouth as needed for diarrhea or loose stools.    Yes Historical Provider, MD  carvedilol (COREG) 6.25 MG tablet Take 6.25 mg by mouth 2 (two) times daily with a meal.   Yes Historical Provider, MD  cholecalciferol (VITAMIN D) 1000 UNITS tablet Take 1,000 Units by mouth daily.   Yes Historical Provider, MD  dextromethorphan (DELSYM) 30 MG/5ML liquid Take 10 mLs by mouth as needed for cough.   Yes Historical Provider, MD  donepezil (ARICEPT) 10 MG tablet Take 10 mg by mouth at bedtime.   Yes Historical Provider, MD  Fluticasone Furoate-Vilanterol 100-25 MCG/INH AEPB Inhale 1 puff into the lungs daily with breakfast.   Yes Historical Provider, MD  furosemide (LASIX) 40 MG tablet Take 1 tablet (40 mg total) by mouth daily. 12/06/15  Yes Albertine Patricia, MD  hydrocortisone cream (PREPARATION H HYDROCORTISONE) 1 % Apply 1 application topically 2 (two) times daily as needed. For hemmoroids.   Yes Historical Provider, MD  losartan (COZAAR) 25 MG tablet Take 25 mg by mouth daily.   Yes Historical Provider, MD  mirabegron ER (MYRBETRIQ) 50 MG TB24 tablet Take 50 mg by mouth daily with breakfast.   Yes Historical Provider, MD  nitroGLYCERIN  (NITROSTAT) 0.3 MG SL tablet Place 0.3 mg under the tongue every 5 (five) minutes as needed for chest pain.   Yes Historical Provider, MD  phenytoin (DILANTIN) 100 MG ER capsule Take 200 mg by mouth 2 (two) times daily.   Yes Historical Provider, MD  spironolactone (ALDACTONE) 25 MG tablet Take 25 mg by mouth every morning.   Yes Historical Provider, MD  loperamide (IMODIUM) 2 MG capsule Take 1 capsule (2 mg total) by mouth 4 (four) times daily as needed for diarrhea or loose stools. Patient not taking: Reported on 05/17/2016 02/28/16  Orpah Greek, MD    Physical Exam: Vitals:   05/17/16 1714 05/17/16 2028 05/17/16 2228  BP: 143/86 142/88 132/98  Pulse: 77 84 79  Resp: 20 15 19   Temp: 97.7 F (36.5 C)    TempSrc: Oral    SpO2: 92% 94% 95%      Constitutional: Calm, comfortable, chronically-ill appearing Eyes: PERTLA, lids and conjunctivae normal ENMT: Mucous membranes are moist. Posterior pharynx clear of any exudate or lesions.   Neck: normal, supple, no masses, no thyromegaly Respiratory: Crackles in bilateral bases. Normal respiratory effort. No accessory muscle use.  Cardiovascular: S1 & S2 heard, regular rate and rhythm, soft systolic murmur at apex. 2+ pretibial edema bilaterally. JVP elevated to 10 cm H2O Abdomen: No distension, no tenderness, no masses palpated. Bowel sounds normal.  Musculoskeletal: no clubbing / cyanosis. No joint deformity upper and lower extremities. Normal muscle tone.  Skin: no significant rashes, lesions, ulcers. Warm, dry, well-perfused. Neurologic: CN 2-12 grossly intact. Sensation intact, DTR normal. Strength 5/5 in all 4 limbs.  Psychiatric: Normal judgment and insight. Alert and oriented x 3.       Labs on Admission: I have personally reviewed following labs and imaging studies  CBC:  Recent Labs Lab 05/17/16 1916  WBC 7.6  NEUTROABS 4.9  HGB 13.2  HCT 38.9*  MCV 89.6  PLT [id number]   Basic Metabolic Panel:  Recent Labs Lab  05/17/16 1916  NA 140  K 3.8  CL 110  CO2 23  GLUCOSE 93  BUN 17  CREATININE 1.12  CALCIUM 9.0   GFR: CrCl cannot be calculated (Unknown ideal weight.). Liver Function Tests: No results for input(s): AST, ALT, ALKPHOS, BILITOT, PROT, ALBUMIN in the last 168 hours. No results for input(s): LIPASE, AMYLASE in the last 168 hours. No results for input(s): AMMONIA in the last 168 hours. Coagulation Profile: No results for input(s): INR, PROTIME in the last 168 hours. Cardiac Enzymes:  Recent Labs Lab 05/17/16 1916  TROPONINI 0.04*   BNP (last 3 results) No results for input(s): PROBNP in the last 8760 hours. HbA1C: No results for input(s): HGBA1C in the last 72 hours. CBG: No results for input(s): GLUCAP in the last 168 hours. Lipid Profile: No results for input(s): CHOL, HDL, LDLCALC, TRIG, CHOLHDL, LDLDIRECT in the last 72 hours. Thyroid Function Tests: No results for input(s): TSH, T4TOTAL, FREET4, T3FREE, THYROIDAB in the last 72 hours. Anemia Panel: No results for input(s): VITAMINB12, FOLATE, FERRITIN, TIBC, IRON, RETICCTPCT in the last 72 hours. Urine analysis:    Component Value Date/Time   COLORURINE YELLOW 02/27/2016 0015   APPEARANCEUR CLEAR 02/27/2016 0015   LABSPEC 1.009 02/27/2016 0015   PHURINE 5.5 02/27/2016 0015   GLUCOSEU NEGATIVE 02/27/2016 0015   HGBUR SMALL (A) 02/27/2016 0015   BILIRUBINUR NEGATIVE 02/27/2016 0015   KETONESUR NEGATIVE 02/27/2016 0015   PROTEINUR NEGATIVE 02/27/2016 0015   UROBILINOGEN 0.2 08/08/2014 1109   NITRITE NEGATIVE 02/27/2016 0015   LEUKOCYTESUR MODERATE (A) 02/27/2016 0015   Sepsis Labs: @LABRCNTIP (procalcitonin:4,lacticidven:4) )No results found for this or any previous visit (from the past 240 hour(s)).   Radiological Exams on Admission: Dg Chest 2 View  Result Date: 05/17/2016 CLINICAL DATA:  Dyspnea x3 days with cough.  No fever. EXAM: CHEST  2 VIEW COMPARISON:  12/04/2015 CXR FINDINGS: Stable cardiomegaly  with tortuous thoracic aorta. Mild interstitial edema. Faint subpleural areas of interstitial fibrosis bilaterally especially at the lung bases. Trace edema along the minor fissure with evolving atelectatic change. Old  right-sided rib fractures. No acute nor suspicious osseous abnormality. No pneumothorax or effusion. IMPRESSION: Cardiomegaly with mild interstitial edema since prior exam. Trace edema/fluid in the minor fissure with adjacent area bandlike atelectasis. Electronically Signed   By: Ashley Royalty M.D.   On: 05/17/2016 17:50    EKG: Independently reviewed. Sinus rhythm, PVC's, incomplete LBBB  Assessment/Plan  1. Acute on chronic combined systolic/diastolic CHF  - Pt presents with worsening dyspnea, noted to have peripheral edema, JVD, crackles, interstitial edema on CXR  - TTE (12/04/15) with EF 123456, grade 1 diastolic dysfunction, akinesis and thinning in LAD territory, mild LAE, mild MR, mild TR - He follows with cardiology and is managed with Lasix 40 mg qD, losartan, Aldactone, and Coreg - Plan to start with IV Lasix 40 mg qD, and adjust prn - Continue Coreg and Aldactone as tolerated, hold losartan while diuresing and following daily chem panels  - Follow daily wts and I/Os, fluid-restrict diet and SLIV - Update TTE   2. Hypertension  - BP at goal on admission  - Continue Coreg and Aldactone as tolerated; losartan held as above   3. CAD  - History of stent to LAD, followed by cardiology  - No anginal complaints on admission  - Troponin is borderline elevated, likely secondary to AoC CHF, will trend - Monitoring of telemetry with diuresis  - Continue ASA 81, Lipitor, Coreg; losartan held on admission as above   4. Dementia - Appears to be stable on admission; pt calm, cooperative, provides history  - Continue Aricept   5. Seizure disorder  - Stable, no recent seizures  - Continue Dilantin    DVT prophylaxis: sq Lovenox Code Status: DNR  Family Communication:  Stepdaughter (POA) updated at bedside Disposition Plan: Admit to telemetry Consults called: None Admission status: Inpatient    Vianne Bulls, MD [name] Pager [phone]  If 7PM-7AM, please contact night-coverage [email or web address] Password [email or web address]  05/18/2016, 12:44 AM

## 2016-05-18 NOTE — Progress Notes (Signed)
  Echocardiogram 2D Echocardiogram has been performed.  Aggie Cosier 05/18/2016, 9:27 AM

## 2016-05-18 NOTE — Progress Notes (Signed)
PROGRESS NOTE    [name]  [id number] DOB: [birth year] DOA: 05/17/2016 PCP: Tivis Ringer, MD    Brief Narrative:  83 y.o. male with medical history significant for COPD, coronary artery disease, chronic combined CHF, dementia, hypertension, seizure disorder, urethral stricture with chronic indwelling Foley catheter, and blindness who presents from his nursing facility for evaluation of progressive cough and dyspnea over the past 3 days. Patient reports being in his usual state of health until approximately 3 days ago when he noted the insidious development of a cough and dyspnea with exertion. There has been no fevers or chills and no chest pain or palpitations. He reports his cough to be productive and his stepdaughter at the bedside and notes that sputum has been green and brownish. Patient denies rhinorrhea, sore throat, aches, or malaise. He reports that for the past 2 days, he has been getting short of breath while playing the piano and just prior to arrival was dyspneic while at rest. Given his blindness, he is uncertain about increased swelling.  Assessment & Plan:   Principal Problem:   Acute on chronic combined systolic and diastolic CHF (congestive heart failure) (HCC) Active Problems:   Hypertension   Seizure disorder (HCC)   CAD (coronary artery disease)   Hypoxia   Dementia   1. Acute on chronic combined systolic/diastolic CHF  - Pt presents with worsening dyspnea, noted to have peripheral edema, JVD, crackles, interstitial edema on CXR  - TTE (12/04/15) with EF 123456, grade 1 diastolic dysfunction, akinesis and thinning in LAD territory, mild LAE, mild MR, mild TR - Patient normally follows with cardiology and is managed with Lasix 40 mg qD, losartan, Aldactone, and Coreg - Continue Coreg and Aldactone as tolerated, hold losartan while diuresing and following daily chem panels  - Follow daily wts and I/Os, fluid-restrict diet and SLIV - Repeat 2d echo reviewed.  Findings of EF 15-20% -Wt today 65.5kg -Wt on admit 68.5kg  2. Hypertension  - BP at goal on admission  - Continue Coreg and Aldactone as tolerated; losartan held as above   3. CAD  - History of stent to LAD, followed by cardiology  - No anginal complaints on admission  - Troponin is borderline elevated, likely secondary to AoC CHF, will trend - Monitoring of telemetry with diuresis  - Continue ASA 81, Lipitor, Coreg; losartan held on admission as above  - Stable at present  4. Dementia - Appears to be stable on admission; pt calm, cooperative, provides history  - Continue Aricept as tolerated  5. Seizure disorder  - No recent seizures  - Continue Dilantin  - Stable thus far  DVT prophylaxis: Lovenox subQ Code Status: DNR Family Communication: Pt in room, family not at bedside Disposition Plan: Uncertain at this time  Consultants:     Procedures:     Antimicrobials: Anti-infectives    None      Subjective: Eager to go home  Objective: Vitals:   05/18/16 0542 05/18/16 0809 05/18/16 0958 05/18/16 1508  BP: 136/82 128/79  104/61  Pulse: 78 75  66  Resp: 19   18  Temp: 97.9 F (36.6 C)   98.1 F (36.7 C)  TempSrc: Oral   Oral  SpO2: 93%  90% 94%  Weight:      Height:        Intake/Output Summary (Last 24 hours) at 05/18/16 1603 Last data filed at 05/18/16 0900  Gross per 24 hour  Intake  123 ml  Output             1700 ml  Net            -1577 ml   Filed Weights   05/18/16 0141 05/18/16 0142 05/18/16 0514  Weight: 68.5 kg (151 lb) 68.5 kg (151 lb) 65.5 kg (144 lb 6.4 oz)    Examination:  General exam: Appears calm and comfortable  Respiratory system: Clear to auscultation. Respiratory effort normal. Cardiovascular system: S1 & S2 heard, RRR Gastrointestinal system: Abdomen is nondistended, soft and nontender. No organomegaly or masses felt. Normal bowel sounds heard. Central nervous system: Alert and oriented. No focal  neurological deficits. Extremities: Symmetric 5 x 5 power. Skin: No rashes, lesions Psychiatry: Judgement and insight appear normal. Mood & affect appropriate.   Data Reviewed: I have personally reviewed following labs and imaging studies  CBC:  Recent Labs Lab 05/17/16 1916  WBC 7.6  NEUTROABS 4.9  HGB 13.2  HCT 38.9*  MCV 89.6  PLT [id number]   Basic Metabolic Panel:  Recent Labs Lab 05/17/16 1916  NA 140  K 3.8  CL 110  CO2 23  GLUCOSE 93  BUN 17  CREATININE 1.12  CALCIUM 9.0   GFR: Estimated Creatinine Clearance: 47.9 mL/min (by C-G formula based on SCr of 1.12 mg/dL). Liver Function Tests: No results for input(s): AST, ALT, ALKPHOS, BILITOT, PROT, ALBUMIN in the last 168 hours. No results for input(s): LIPASE, AMYLASE in the last 168 hours. No results for input(s): AMMONIA in the last 168 hours. Coagulation Profile: No results for input(s): INR, PROTIME in the last 168 hours. Cardiac Enzymes:  Recent Labs Lab 05/17/16 1916 05/18/16 0111 05/18/16 0529 05/18/16 1228  TROPONINI 0.04* 0.04* 0.04* 0.04*   BNP (last 3 results) No results for input(s): PROBNP in the last 8760 hours. HbA1C: No results for input(s): HGBA1C in the last 72 hours. CBG: No results for input(s): GLUCAP in the last 168 hours. Lipid Profile: No results for input(s): CHOL, HDL, LDLCALC, TRIG, CHOLHDL, LDLDIRECT in the last 72 hours. Thyroid Function Tests: No results for input(s): TSH, T4TOTAL, FREET4, T3FREE, THYROIDAB in the last 72 hours. Anemia Panel: No results for input(s): VITAMINB12, FOLATE, FERRITIN, TIBC, IRON, RETICCTPCT in the last 72 hours. Sepsis Labs: No results for input(s): PROCALCITON, LATICACIDVEN in the last 168 hours.  Recent Results (from the past 240 hour(s))  MRSA PCR Screening     Status: None   Collection Time: 05/18/16  5:55 AM  Result Value Ref Range Status   MRSA by PCR NEGATIVE NEGATIVE Final    Comment:        The GeneXpert MRSA Assay (FDA approved  for NASAL specimens only), is one component of a comprehensive MRSA colonization surveillance program. It is not intended to diagnose MRSA infection nor to guide or monitor treatment for MRSA infections.      Radiology Studies: Dg Chest 2 View  Result Date: 05/17/2016 CLINICAL DATA:  Dyspnea x3 days with cough.  No fever. EXAM: CHEST  2 VIEW COMPARISON:  12/04/2015 CXR FINDINGS: Stable cardiomegaly with tortuous thoracic aorta. Mild interstitial edema. Faint subpleural areas of interstitial fibrosis bilaterally especially at the lung bases. Trace edema along the minor fissure with evolving atelectatic change. Old right-sided rib fractures. No acute nor suspicious osseous abnormality. No pneumothorax or effusion. IMPRESSION: Cardiomegaly with mild interstitial edema since prior exam. Trace edema/fluid in the minor fissure with adjacent area bandlike atelectasis. Electronically Signed   By: Meredith Leeds.D.  On: 05/17/2016 17:50    Scheduled Meds: . aspirin  81 mg Oral Daily  . atorvastatin  10 mg Oral Daily  . carvedilol  6.25 mg Oral BID WC  . cholecalciferol  1,000 Units Oral Daily  . donepezil  10 mg Oral QHS  . enoxaparin (LOVENOX) injection  40 mg Subcutaneous QHS  . fluticasone furoate-vilanterol  1 puff Inhalation Q breakfast  . [START ON 05/19/2016] furosemide  40 mg Intravenous Daily  . mirabegron ER  50 mg Oral Q breakfast  . phenytoin  200 mg Oral BID  . potassium chloride  20 mEq Oral BID  . sodium chloride flush  3 mL Intravenous Q12H  . spironolactone  25 mg Oral q morning - 10a   Continuous Infusions:   LOS: 0 days   CHIU, Orpah Melter, MD [name] Pager [phone]  If 7PM-7AM, please contact night-coverage [email or web address] Password [facility] 05/18/2016, 4:03 PM

## 2016-05-19 DIAGNOSIS — E785 Hyperlipidemia, unspecified: Secondary | ICD-10-CM

## 2016-05-19 DIAGNOSIS — I10 Essential (primary) hypertension: Secondary | ICD-10-CM

## 2016-05-19 DIAGNOSIS — I2510 Atherosclerotic heart disease of native coronary artery without angina pectoris: Secondary | ICD-10-CM

## 2016-05-19 DIAGNOSIS — E8770 Fluid overload, unspecified: Secondary | ICD-10-CM

## 2016-05-19 DIAGNOSIS — R600 Localized edema: Secondary | ICD-10-CM

## 2016-05-19 DIAGNOSIS — I5043 Acute on chronic combined systolic (congestive) and diastolic (congestive) heart failure: Secondary | ICD-10-CM

## 2016-05-19 LAB — BASIC METABOLIC PANEL
Anion gap: 6 (ref 5–15)
BUN: 19 mg/dL (ref 6–20)
CHLORIDE: 111 mmol/L (ref 101–111)
CO2: 25 mmol/L (ref 22–32)
Calcium: 9 mg/dL (ref 8.9–10.3)
Creatinine, Ser: 1.33 mg/dL — ABNORMAL HIGH (ref 0.61–1.24)
GFR, EST AFRICAN AMERICAN: 56 mL/min — AB (ref >60)
GFR, EST NON AFRICAN AMERICAN: 48 mL/min — AB (ref >60)
Glucose, Bld: 83 mg/dL (ref 65–99)
POTASSIUM: 4 mmol/L (ref 3.5–5.1)
SODIUM: 142 mmol/L (ref 135–145)

## 2016-05-19 NOTE — Progress Notes (Addendum)
PROGRESS NOTE    [name]  [name] DOB: 1932/05/27 DOA: 05/17/2016 PCP: Tivis Ringer, MD    Brief Narrative:  84 y.o. male with medical history significant for COPD, coronary artery disease, chronic combined CHF, dementia, hypertension, seizure disorder, urethral stricture with chronic indwelling Foley catheter, and blindness who presents from his nursing facility for evaluation of progressive cough and dyspnea over the past 3 days. Patient reports being in his usual state of health until approximately 3 days ago when he noted the insidious development of a cough and dyspnea with exertion. There has been no fevers or chills and no chest pain or palpitations. He reports his cough to be productive and his stepdaughter at the bedside and notes that sputum has been green and brownish. Patient denies rhinorrhea, sore throat, aches, or malaise. He reports that for the past 2 days, he has been getting short of breath while playing the piano and just prior to arrival was dyspneic while at rest. Given his blindness, he is uncertain about increased swelling.  Assessment & Plan:   Principal Problem:   Acute on chronic combined systolic and diastolic CHF (congestive heart failure) (HCC) Active Problems:   Hypertension   Seizure disorder (HCC)   CAD (coronary artery disease)   Hypoxia   Dementia   1. Acute on chronic combined systolic/diastolic CHF  - Pt presents with worsening dyspnea, noted to have peripheral edema, JVD, crackles, interstitial edema on CXR  - Patient normally follows with cardiology and is managed with Lasix 40 mg qD, losartan, Aldactone, and Coreg - Continue Coreg and Aldactone as tolerated, hold losartan while diuresing and following daily chem panels  - Continue to follow daily wts and I/Os, fluid-restrict diet and SLIV - Repeat 2d echo reviewed. Findings of EF 15-20% -Wt today 68.9kg -Wt on admit 68.5kg -Given severity of heart failure, have consulted  Cardiology. Discussed case with Dr. Johnsie Cancel, who recommends continued lasix IV BID with possible change to PO lasix on 2/18 - Repeat bmet in AM  2. Hypertension  - BP at goal on admission  - Will continue patient on Coreg and Aldactone as tolerated; losartan held as above   3. CAD  - History of stent to LAD, followed by cardiology  - No anginal complaints on admission  - Troponin is borderline elevated, likely secondary to AoC CHF, will trend - Monitoring of telemetry with diuresis  - Continue ASA 81, Lipitor, Coreg; losartan held on admission as above  - Remains stable today  4. Dementia - Appears to be stable on admission; pt calm, cooperative, provides history  - Plan to continue Aricept as tolerated  5. Seizure disorder  - No recent seizures  - Continue Dilantin  - Remains stable currently  DVT prophylaxis: Lovenox subQ Code Status: DNR Family Communication: Pt in room, family not at bedside Disposition Plan: Uncertain at this time  Consultants:   Cardiology  Procedures:     Antimicrobials: Anti-infectives    None      Subjective: Eager to go home  Objective: Vitals:   05/18/16 2052 05/19/16 0647 05/19/16 1241 05/19/16 1320  BP: 113/66 120/73  (!) 124/92  Pulse: 90 94  82  Resp: 19 20  18   Temp: 97.8 F (36.6 C) 99 F (37.2 C)  98 F (36.7 C)  TempSrc: Oral Oral  Oral  SpO2: 93% 94% 95% 95%  Weight:  68.9 kg (151 lb 14.4 oz)    Height:        Intake/Output  Summary (Last 24 hours) at 05/19/16 1535 Last data filed at 05/19/16 1320  Gross per 24 hour  Intake              470 ml  Output             1800 ml  Net            -1330 ml   Filed Weights   05/18/16 0142 05/18/16 0514 05/19/16 0647  Weight: 68.5 kg (151 lb) 65.5 kg (144 lb 6.4 oz) 68.9 kg (151 lb 14.4 oz)    Examination:  General exam: Awake, laying in bed, in nad Respiratory system: Normal resp effort, no auditory wheezing. Cardiovascular system: regular rate,  s1-2 Gastrointestinal system: soft, nondistended, pos BS. Central nervous system: cn2-12 grossly intact, strength intact Extremities: perfused, no clubbing Skin: normal skin turgor, no notable skin lesions seen Psychiatry: mood appears normal/ no visual hallucinations.   Data Reviewed: I have personally reviewed following labs and imaging studies  CBC:  Recent Labs Lab 05/17/16 1916  WBC 7.6  NEUTROABS 4.9  HGB 13.2  HCT 38.9*  MCV 89.6  PLT [id number]   Basic Metabolic Panel:  Recent Labs Lab 05/17/16 1916 05/19/16 0519  NA 140 142  K 3.8 4.0  CL 110 111  CO2 23 25  GLUCOSE 93 83  BUN 17 19  CREATININE 1.12 1.33*  CALCIUM 9.0 9.0   GFR: Estimated Creatinine Clearance: 42.1 mL/min (by C-G formula based on SCr of 1.33 mg/dL (H)). Liver Function Tests: No results for input(s): AST, ALT, ALKPHOS, BILITOT, PROT, ALBUMIN in the last 168 hours. No results for input(s): LIPASE, AMYLASE in the last 168 hours. No results for input(s): AMMONIA in the last 168 hours. Coagulation Profile: No results for input(s): INR, PROTIME in the last 168 hours. Cardiac Enzymes:  Recent Labs Lab 05/17/16 1916 05/18/16 0111 05/18/16 0529 05/18/16 1228  TROPONINI 0.04* 0.04* 0.04* 0.04*   BNP (last 3 results) No results for input(s): PROBNP in the last 8760 hours. HbA1C: No results for input(s): HGBA1C in the last 72 hours. CBG: No results for input(s): GLUCAP in the last 168 hours. Lipid Profile: No results for input(s): CHOL, HDL, LDLCALC, TRIG, CHOLHDL, LDLDIRECT in the last 72 hours. Thyroid Function Tests: No results for input(s): TSH, T4TOTAL, FREET4, T3FREE, THYROIDAB in the last 72 hours. Anemia Panel: No results for input(s): VITAMINB12, FOLATE, FERRITIN, TIBC, IRON, RETICCTPCT in the last 72 hours. Sepsis Labs: No results for input(s): PROCALCITON, LATICACIDVEN in the last 168 hours.  Recent Results (from the past 240 hour(s))  MRSA PCR Screening     Status: None    Collection Time: 05/18/16  5:55 AM  Result Value Ref Range Status   MRSA by PCR NEGATIVE NEGATIVE Final    Comment:        The GeneXpert MRSA Assay (FDA approved for NASAL specimens only), is one component of a comprehensive MRSA colonization surveillance program. It is not intended to diagnose MRSA infection nor to guide or monitor treatment for MRSA infections.      Radiology Studies: Dg Chest 2 View  Result Date: 05/17/2016 CLINICAL DATA:  Dyspnea x3 days with cough.  No fever. EXAM: CHEST  2 VIEW COMPARISON:  12/04/2015 CXR FINDINGS: Stable cardiomegaly with tortuous thoracic aorta. Mild interstitial edema. Faint subpleural areas of interstitial fibrosis bilaterally especially at the lung bases. Trace edema along the minor fissure with evolving atelectatic change. Old right-sided rib fractures. No acute nor suspicious osseous abnormality. No  pneumothorax or effusion. IMPRESSION: Cardiomegaly with mild interstitial edema since prior exam. Trace edema/fluid in the minor fissure with adjacent area bandlike atelectasis. Electronically Signed   By: Ashley Royalty M.D.   On: 05/17/2016 17:50    Scheduled Meds: . aspirin  81 mg Oral Daily  . atorvastatin  10 mg Oral Daily  . carvedilol  6.25 mg Oral BID WC  . cholecalciferol  1,000 Units Oral Daily  . donepezil  10 mg Oral QHS  . enoxaparin (LOVENOX) injection  40 mg Subcutaneous QHS  . fluticasone furoate-vilanterol  1 puff Inhalation Q breakfast  . furosemide  40 mg Intravenous Daily  . mirabegron ER  50 mg Oral Q breakfast  . phenytoin  200 mg Oral BID  . potassium chloride  20 mEq Oral BID  . sodium chloride flush  3 mL Intravenous Q12H  . spironolactone  25 mg Oral q morning - 10a   Continuous Infusions:   LOS: 1 day   Govani Radloff, Orpah Melter, MD [name] Pager [phone]  If 7PM-7AM, please contact night-coverage [email or web address] Password [email or web address] 05/19/2016, 3:35 PM

## 2016-05-19 NOTE — Evaluation (Signed)
Occupational Therapy Evaluation Patient Details Name: [name] MRN: [id number] DOB: 05-29-1932 Today's Date: 05/19/2016    History of Present Illness Pt was admitted for SOB and diagnosed with acute or chronic CHF.  PMH:  dementia, HTN, COPD, seizues, indwelling catheter and legal blindness   Clinical Impression   This 83 year old man was admitted for the above. He will benefit from continued OT in acute setting.  He is mod I for basic ADLs at baseline.  Pt currently needs set up to min guard level assistance. Goals are for supervision level in acute settting    Follow Up Recommendations  Home health OT (depending upon progress/need)    Equipment Recommendations  None recommended by OT    Recommendations for Other Services       Precautions / Restrictions Precautions Precautions: Fall Restrictions Weight Bearing Restrictions: No      Mobility Bed Mobility               General bed mobility comments: oob  Transfers Overall transfer level: Needs assistance Equipment used: Rolling walker (2 wheeled) Transfers: Sit to/from Stand Sit to Stand: Supervision         General transfer comment: cues for UE placement    Balance                                            ADL Overall ADL's : Needs assistance/impaired     Grooming: Set up;Sitting   Upper Body Bathing: Set up;Sitting   Lower Body Bathing: Supervison/ safety   Upper Body Dressing : Set up;Sitting   Lower Body Dressing: Supervision/safety;Sit to/from stand   Toilet Transfer: Min guard;Ambulation;RW;Comfort height toilet;Grab bars             General ADL Comments: ambulated to bathroom, twice.  Cues for walker distance. Pt is used to a cane and moves quickly.  No LOB but min guard for safety     Vision     Perception     Praxis      Pertinent Vitals/Pain Pain Assessment: No/denies pain     Hand Dominance     Extremity/Trunk Assessment Upper  Extremity Assessment Upper Extremity Assessment: Overall WFL for tasks assessed           Communication Communication Communication: No difficulties   Cognition Arousal/Alertness: Awake/alert Behavior During Therapy: WFL for tasks assessed/performed Overall Cognitive Status: Within Functional Limits for tasks assessed (h/o dementia, oriented x 3)                     General Comments       Exercises       Shoulder Instructions      Home Living Family/patient expects to be discharged to:: Assisted living                                 Additional Comments: ambulated with a cane      Prior Functioning/Environment Level of Independence: Independent with assistive device(s)        Comments: does own self care. 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  "I will probably miss this Sunday" OT Goal Formulation: With patient Time For Goal Achievement: 05/26/16 Potential to Achieve Goals: Good ADL Goals Pt Will Transfer to Toilet: with supervision;ambulating (high commode) Pt Will Perform Tub/Shower Transfer: Shower transfer;with min guard assist;ambulating Additional ADL Goal #1: pt will gather clothes at supervision level  OT Frequency: Min 2X/week   Barriers to D/C:            Co-evaluation              End of Session    Activity Tolerance: Patient tolerated treatment well Patient left: in chair;with call bell/phone within reach   Time: 1517-1535 OT Time Calculation (min): 18 min Charges:  OT General  Charges $OT Visit: 1 Procedure OT Evaluation $OT Eval Low Complexity: 1 Procedure G-Codes:    Arneshia Ade 05-20-16, 3:48 PM Lesle Chris, OTR/L [phone] 05-20-16

## 2016-05-19 NOTE — NC FL2 (Signed)
[place] LEVEL OF CARE SCREENING TOOL     IDENTIFICATION  Patient Name: [name] Birthdate: [birth year] Sex: male Admission Date (Current Location): 05/17/2016  [facility] and [place] Number:  Herbalist and Address:  [facility],  [address], [place]      Provider Number: [id number]  Attending Physician Name and Address:  Donne Hazel, MD  Relative Name and Phone Number:       Current Level of Care: Hospital Recommended Level of Care: [place] Prior Approval Number:    Date Approved/Denied:   PASRR Number:    Discharge Plan: Other (Comment) (ALF )    Current Diagnoses: Patient Active Problem List   Diagnosis Date Noted  . Bilateral leg edema   . Hypervolemia   . Dementia 05/18/2016  . Hypoxia 12/03/2015  . Acute on chronic combined systolic and diastolic CHF (congestive heart failure) ([place]) 12/03/2015  . Rectal bleeding 07/01/2015  . UTI (lower urinary tract infection) 03/07/2012  . Lower GI bleed = suspect diverticular 03/06/2012  . Chest pain, unspecified 05/11/2011  . Preop cardiovascular exam 12/07/2010  . CAD (coronary artery disease) 08/24/2010  . Hypertension   . Seizure disorder ([place])   . Anemia   . LV dysfunction   . NSTEMI (non-ST elevated myocardial infarction) ([place])     Orientation RESPIRATION BLADDER Height & Weight     Self, Time, Place   (at 2L) Continent Weight: 151 lb 14.4 oz (68.9 kg) Height:  5\' 8"  (172.7 cm)  BEHAVIORAL SYMPTOMS/MOOD NEUROLOGICAL BOWEL NUTRITION STATUS   (none ) Convulsions/Seizures Continent Diet (Heart Healthy )  AMBULATORY STATUS COMMUNICATION OF NEEDS Skin   Supervision Verbally Normal                       Personal Care Assistance Level of Assistance  Bathing, Dressing, Feeding Bathing Assistance: Limited assistance Feeding assistance: Limited assistance Dressing Assistance: Limited assistance     Functional Limitations Info   Speech, Hearing, Sight Sight Info: Adequate Hearing Info: Adequate Speech Info: Adequate    SPECIAL CARE FACTORS FREQUENCY  PT (By licensed PT), OT (By licensed OT)     PT Frequency: 3 OT Frequency: 2            Contractures      Additional Factors Info  Code Status, Allergies Code Status Info: DNR CODE  Allergies Info: Red Dye, Iodinated Diagnostic Agents           Current Medications (05/19/2016):  This is the current hospital active medication list Current Facility-Administered Medications  Medication Dose Route Frequency Provider Last Rate Last Dose  . 0.9 %  sodium chloride infusion  250 mL Intravenous PRN Vianne Bulls, MD      . acetaminophen (TYLENOL) tablet 1,000 mg  1,000 mg Oral Q8H PRN Vianne Bulls, MD      . aspirin chewable tablet 81 mg  81 mg Oral Daily Vianne Bulls, MD   81 mg at 05/19/16 1050  . atorvastatin (LIPITOR) tablet 10 mg  10 mg Oral Daily Vianne Bulls, MD   10 mg at 05/19/16 1050  . benzocaine (HURRICAINE) 20 % jelly 1 application  1 application Mouth/Throat QID PRN Vianne Bulls, MD      . carvedilol (COREG) tablet 6.25 mg  6.25 mg Oral BID WC Vianne Bulls, MD   6.25 mg at 05/19/16 0842  . cholecalciferol (VITAMIN D) tablet 1,000  Units  1,000 Units Oral Daily Vianne Bulls, MD   1,000 Units at 05/19/16 1050  . dextromethorphan (DELSYM) 30 MG/5ML liquid 60 mg  10 mL Oral PRN Vianne Bulls, MD      . donepezil (ARICEPT) tablet 10 mg  10 mg Oral QHS Vianne Bulls, MD   10 mg at 05/18/16 2146  . enoxaparin (LOVENOX) injection 40 mg  40 mg Subcutaneous QHS Vianne Bulls, MD   40 mg at 05/18/16 2147  . fluticasone furoate-vilanterol (BREO ELLIPTA) 100-25 MCG/INH 1 puff  1 puff Inhalation Q breakfast Vianne Bulls, MD   1 puff at 05/19/16 1241  . furosemide (LASIX) injection 40 mg  40 mg Intravenous Daily Donne Hazel, MD   40 mg at 05/19/16 1051  . mirabegron ER (MYRBETRIQ) tablet 50 mg  50 mg Oral Q breakfast Vianne Bulls, MD   50  mg at 05/19/16 0842  . ondansetron (ZOFRAN) injection 4 mg  4 mg Intravenous Q6H PRN Vianne Bulls, MD      . phenytoin (DILANTIN) ER capsule 200 mg  200 mg Oral BID Vianne Bulls, MD   200 mg at 05/18/16 2146  . potassium chloride SA (K-DUR,KLOR-CON) CR tablet 20 mEq  20 mEq Oral BID Vianne Bulls, MD   20 mEq at 05/19/16 1050  . sodium chloride flush (NS) 0.9 % injection 3 mL  3 mL Intravenous Q12H Vianne Bulls, MD   3 mL at 05/19/16 1052  . sodium chloride flush (NS) 0.9 % injection 3 mL  3 mL Intravenous PRN Vianne Bulls, MD      . spironolactone (ALDACTONE) tablet 25 mg  25 mg Oral q morning - 10a Vianne Bulls, MD   25 mg at 05/19/16 1050     Discharge Medications: Please see discharge summary for a list of discharge medications.  Relevant Imaging Results:  Relevant Lab Results:   Additional Information SSN [id number]  Rozell Searing

## 2016-05-19 NOTE — Clinical Social Work Note (Signed)
MSW attempted to contact patient's relatives and left a message for a returned phone call re: discharge planning.   Patient attempted from [facility]. MSW remains available as needed.   Glendon Axe, MSW [phone] 05/19/2016 3:49 PM

## 2016-05-19 NOTE — Consult Note (Signed)
CARDIOLOGY CONSULT NOTE       Patient ID: [name] MRN: [id number] DOB/AGE: 08-14-1932 83 y.o.  Admit date: 05/17/2016 Referring Physician:  Wyline Copas Primary Physician: Tivis Ringer, MD Primary Cardiologist:  Johnsie Cancel Reason for Consultation:  CHF  Principal Problem:   Acute on chronic combined systolic and diastolic CHF (congestive heart failure) ([place]) Active Problems:   Hypertension   Seizure disorder ([place])   CAD (coronary artery disease)   Hypoxia   Dementia   HPI:   [name] is a 83 y.o.male  presenting to hospital with dyspnea and CHF   History of  Anterior MI with stent to mid LAD in 2010. Last myovue 2013 with large anterior MI and EF 24% . Last echo reviewed 12/04/15 and EF 20% with mild MR and estimated PA pressure 46 mmHg.  Seen at [facility] by pulmonary and hospitalist 12/06/15.  Dyspnea and volume overload. Also has dementia and chronic indwelling foley.   Diuresed and d/c for outpatient f/u.   He lives at [facility] assisted [facility]. Step daugher is his power of  attorney   Has had progressive cough and dyspnea over 3 days. Getting lasix 40 mg daily did have some "brown" phlegm. No fever chills or systemic signs of infection. CXR mild cephalization and BNP Elevated at 1422  Nurse noted 1 L output just now with iv lasix. He has no edema or physical signs Of volume overload No chest pain.    ROS All other systems reviewed and negative except as noted above  Past Medical History:  Diagnosis Date  . Acute MI Dec 2010   cardiac catheriziation and stenting of the LAD (bare metal) 03/14/09              . Anemia   . Angina   . Blood transfusion   . CAD (coronary artery disease)   . COPD (chronic obstructive pulmonary disease) ([place])   . DEMENTIA   . High cholesterol   . Hypertension   . LV dysfunction    EF 25 to 30% per echo May 2012  . NSTEMI (non-ST elevated myocardial infarction) [facility]) Nov 2011   with LV dysfunction  . Prostatic hypertrophy   .  Seizures ([place]) ~ 2006   "when blood pressure went up too high"; on Dilantin  . Urethral stricture    with subsequent trauma and has an indwelling Foley catheter in place during his first MI                                                 . Urinary catheter in place    chronic  . Vision impairment    "legally blind"    Family History  Problem Relation Age of Onset  . Colon cancer Neg Hx     Social History   Social History  . Marital status: Widowed    Spouse name: N/A  . Number of children: 0  . Years of education: N/A   Occupational History  . RETIRED    Social History Main Topics  . Smoking status: Former Smoker    Packs/day: 0.00    Years: 50.00    Types: Cigarettes    Quit date: 08/22/2004  . Smokeless tobacco: Never Used  . Alcohol use No     Comment: "quit drinking 1980's"  . Drug use: No  . Sexual  activity: No   Other Topics Concern  . Not on file   Social History Narrative  . No narrative on file    Past Surgical History:  Procedure Laterality Date  . CARDIAC CATHETERIZATION  02/2010  . 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 CHOLECYSTECTOMY  05/30/11  . CHOLECYSTECTOMY  05/30/2011   Procedure: LAPAROSCOPIC CHOLECYSTECTOMY;  Surgeon: Judieth Keens, DO;  Location: [place];  Service: General;  Laterality: N/A;  . CORONARY ANGIOPLASTY WITH STENT PLACEMENT  03/2009   "1"; stent to LAD  . TONSILLECTOMY     "when I was small"     . aspirin  81 mg Oral Daily  . atorvastatin  10 mg Oral Daily  . carvedilol  6.25 mg Oral BID WC  . cholecalciferol  1,000 Units Oral Daily  . donepezil  10 mg Oral QHS  . enoxaparin (LOVENOX) injection  40 mg Subcutaneous QHS  . fluticasone furoate-vilanterol  1 puff Inhalation Q breakfast  . furosemide  40 mg Intravenous Daily  . mirabegron ER  50 mg Oral Q breakfast  . phenytoin  200 mg Oral BID  . potassium chloride  20 mEq Oral BID  . sodium chloride flush  3 mL Intravenous Q12H   . spironolactone  25 mg Oral q morning - 10a     Physical Exam: Blood pressure (!) 124/92, pulse 82, temperature 98 F (36.7 C), temperature source Oral, resp. rate 18, height 5\' 8"  (1.727 m), weight 151 lb 14.4 oz (68.9 kg), SpO2 95 %.    Affect appropriate Chronically ill black male  HEENT: blind tends to drool  Neck supple with no adenopathy JVP normal no bruits no thyromegaly Lungs basilar crackles  no wheezing and good diaphragmatic motion Heart:  S1/S2 no murmur, no rub, gallop or click PMI normal Abdomen: benighn, BS positve, no tenderness, no AAA no bruit.  No HSM or HJR Distal pulses intact with no bruits No edema Neuro non-focal Skin warm and dry No muscular weakness   Labs:   Lab Results  Component Value Date   WBC 7.6 05/17/2016   HGB 13.2 05/17/2016   HCT 38.9 (L) 05/17/2016   MCV 89.6 05/17/2016   PLT 235 05/17/2016     Recent Labs Lab 05/19/16 0519  NA 142  K 4.0  CL 111  CO2 25  BUN 19  CREATININE 1.33*  CALCIUM 9.0  GLUCOSE 83   Lab Results  Component Value Date   CKTOTAL 43 (L) 12/03/2015   CKMB 1.3 12/03/2015   TROPONINI 0.04 (HH) 05/18/2016    Lab Results  Component Value Date   CHOL  02/10/2010    139        ATP III CLASSIFICATION:  <200     mg/dL   Desirable  200-239  mg/dL   Borderline High  >=240    mg/dL   High          CHOL (H) 03/15/2009    207        ATP III CLASSIFICATION:  <200     mg/dL   Desirable  200-239  mg/dL   Borderline High  >=240    mg/dL   High          Lab Results  Component Value Date   HDL 48 02/10/2010   HDL 35 (L) 03/15/2009   Lab Results  Component Value Date   LDLCALC  02/10/2010    83  Total Cholesterol/HDL:CHD Risk Coronary Heart Disease Risk Table                     Men   Women  1/2 Average Risk   3.4   3.3  Average Risk       5.0   4.4  2 X Average Risk   9.6   7.1  3 X Average Risk  23.4   11.0        Use the calculated Patient Ratio above and the CHD Risk Table to  determine the patient's CHD Risk.        ATP III CLASSIFICATION (LDL):  <100     mg/dL   Optimal  100-129  mg/dL   Near or Above                    Optimal  130-159  mg/dL   Borderline  160-189  mg/dL   High  >190     mg/dL   Very High   LDLCALC (H) 03/15/2009    156        Total Cholesterol/HDL:CHD Risk Coronary Heart Disease Risk Table                     Men   Women  1/2 Average Risk   3.4   3.3  Average Risk       5.0   4.4  2 X Average Risk   9.6   7.1  3 X Average Risk  23.4   11.0        Use the calculated Patient Ratio above and the CHD Risk Table to determine the patient's CHD Risk.        ATP III CLASSIFICATION (LDL):  <100     mg/dL   Optimal  100-129  mg/dL   Near or Above                    Optimal  130-159  mg/dL   Borderline  160-189  mg/dL   High  >190     mg/dL   Very High   Lab Results  Component Value Date   TRIG 42 02/10/2010   TRIG 80 03/15/2009   Lab Results  Component Value Date   CHOLHDL 2.9 02/10/2010   CHOLHDL 5.9 03/15/2009   No results found for: LDLDIRECT    Radiology: Dg Chest 2 View  Result Date: 05/17/2016 CLINICAL DATA:  Dyspnea x3 days with cough.  No fever. EXAM: CHEST  2 VIEW COMPARISON:  12/04/2015 CXR FINDINGS: Stable cardiomegaly with tortuous thoracic aorta. Mild interstitial edema. Faint subpleural areas of interstitial fibrosis bilaterally especially at the lung bases. Trace edema along the minor fissure with evolving atelectatic change. Old right-sided rib fractures. No acute nor suspicious osseous abnormality. No pneumothorax or effusion. IMPRESSION: Cardiomegaly with mild interstitial edema since prior exam. Trace edema/fluid in the minor fissure with adjacent area bandlike atelectasis. Electronically Signed   By: Ashley Royalty M.D.   On: 05/17/2016 17:50    EKG:  SR PVC ICLBBB no acute changes    ASSESSMENT AND PLAN:   CHF:  Not volume overloaded on exam Improved already with diuresis with only mild bump in Cr to  1.3 Continue iv bid lasix. Change to PO Sunday Probably ok to d/c back to [facility] Monday. He is DNR And not a candidate for advanced Rx or inotropes  CAD: ischemic DCM no chest pain ECG non acute and  troponin not significant  Lab Results  Component Value Date   CKTOTAL 43 (L) 12/03/2015   CKMB 1.3 12/03/2015   TROPONINI 0.04 ([place]) 05/18/2016   Urology: chronic indwelling foley consider UA   Cholesterol:  On statin   Dementia:  Stable continue Aricept    Signed: Jenkins Rouge 05/19/2016, 2:04 PM

## 2016-05-19 NOTE — Care Management Note (Signed)
Case Management Note  Patient Details  Name: [name] MRN: [id number] Date of Birth: 14-Dec-1932  Subjective/Objective:83 y/o m admitted w/Acute on chronic CHF. From ALF. CSW following.PT/OT cons-await recc.                    Action/Plan:d/c plan return ALF.   Expected Discharge Date:                  Expected Discharge Plan:  [place]  In-House Referral:  Clinical Social Work  Discharge planning Services  CM Consult  Post Acute Care Choice:    Choice offered to:     DME Arranged:    DME Agency:     HH Arranged:    [place] Agency:     Status of Service:  In process, will continue to follow  If discussed at Long Length of Stay Meetings, dates discussed:    Additional Comments:  Dessa Phi, RN 05/19/2016, 12:15 PM

## 2016-05-20 DIAGNOSIS — F0390 Unspecified dementia without behavioral disturbance: Secondary | ICD-10-CM

## 2016-05-20 DIAGNOSIS — G40909 Epilepsy, unspecified, not intractable, without status epilepticus: Secondary | ICD-10-CM

## 2016-05-20 LAB — BASIC METABOLIC PANEL
Anion gap: 4 — ABNORMAL LOW (ref 5–15)
BUN: 20 mg/dL (ref 6–20)
CHLORIDE: 110 mmol/L (ref 101–111)
CO2: 26 mmol/L (ref 22–32)
CREATININE: 1.17 mg/dL (ref 0.61–1.24)
Calcium: 8.9 mg/dL (ref 8.9–10.3)
GFR calc Af Amer: >60 mL/min (ref >60)
GFR calc non Af Amer: 57 mL/min — ABNORMAL LOW (ref >60)
Glucose, Bld: 78 mg/dL (ref 65–99)
Potassium: 4.4 mmol/L (ref 3.5–5.1)
SODIUM: 140 mmol/L (ref 135–145)

## 2016-05-20 NOTE — Clinical Social Work Note (Addendum)
Clinical Social Work Assessment  Patient Details  Name: [name] MRN: [id number] Date of Birth: 08-13-1932  Date of referral:  05/20/16               Reason for consult:  Discharge Planning                Permission sought to share information with:  Family Supports Permission granted to share information::  Yes, Verbal Permission Granted  Name::     Jorge Mandril  Agency::     Relationship::  Step-daughter (POA)  Contact Information:  [phone]  Housing/Transportation Living arrangements for the past 2 months:  [facility]) Source of Information:  Patient, Power of Attorney Patient Interpreter Needed:  None Criminal Activity/Legal Involvement Pertinent to Current Situation/Hospitalization:  No - Comment as needed Significant Relationships:  Adult Children Probation officer (POA)) Lives with:  Facility Resident [facility]) Do you feel safe going back to the place where you live?  Yes Need for family participation in patient care:  Yes (Comment)  Care giving concerns:  No caregiving concerns identified.    Social Worker assessment / plan:  CSW met with pt to address consult for return to [facility]. Also, spoke with stepdaughter (POA), Jorge Mandril, on the phone [phone]). CSW introduced self and explained CSW responsibilities. Pt and stepdaughter understand and are aware of discharge plan. CSW spoke with Nicki (admissions) at [facility], who stated pt can return to facility upon d/c. Stepdaughter and facility confirmed stepdaughter is POA, not legal guardian.   FL-2 complete. MD paged to sign DNR on chart. CSW will continue to follow.   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   Patient/Family's Understanding of and Emotional Response to Diagnosis, Current Treatment, and Prognosis:  Pt and POA in agreement with return to [place].   Emotional Assessment Appearance:  Appears stated age Attitude/Demeanor/Rapport:   (Appropriate) Affect (typically observed):  Accepting, Adaptable, Pleasant Orientation:  Oriented to Self, Oriented to Place, Oriented to Situation, Oriented to  Time Alcohol / Substance use:  Other Psych involvement (Current and /or in the community):  No (Comment)  Discharge Needs  Concerns to be addressed:  Care Coordination Readmission within the last 30 days:  No Current discharge risk:  Dependent with Mobility Barriers to Discharge:  Continued Medical Work up   [employer], LCSW 05/20/2016, 3:08 PM

## 2016-05-20 NOTE — Evaluation (Addendum)
Physical Therapy Evaluation Patient Details Name: [name] MRN: [id number] DOB: 14-Oct-1932 Today's Date: 05/20/2016   History of Present Illness  Pt was admitted for SOB and diagnosed with acute or chronic CHF.  PMH:  dementia, HTN, COPD, seizues, indwelling catheter and legal blindness  Clinical Impression  Pt admitted with above diagnosis. Pt currently with functional limitations due to the deficits listed below (see PT Problem List). Pt ambulated 150' with RW without loss of balance, SaO2 91% on RA. He is mildly deconditioned 2* CHF, however appears to be near baseline with mobility. Pt should progress well and be able to return to ALF.  Pt will benefit from skilled PT to increase their independence and safety with mobility to allow discharge to the venue listed below.       Follow Up Recommendations No PT follow up    Equipment Recommendations  None recommended by PT    Recommendations for Other Services       Precautions / Restrictions Precautions Precautions: Fall Precaution Comments: denies falls in past 1 year Restrictions Weight Bearing Restrictions: No      Mobility  Bed Mobility               General bed mobility comments: oob  Transfers   Equipment used: Rolling walker (2 wheeled) Transfers: Sit to/from Stand Sit to Stand: Min guard         General transfer comment: cues for UE placement, min/guard safety  Ambulation/Gait Ambulation/Gait assistance: Supervision Ambulation Distance (Feet): 150 Feet Assistive device: Rolling walker (2 wheeled) Gait Pattern/deviations: Trunk flexed;Step-through pattern   Gait velocity interpretation: at or above normal speed for age/gender General Gait Details: SaO2 91% on RA with walking, no LOB  Stairs            Wheelchair Mobility    Modified Rankin (Stroke Patients Only)       Balance Overall balance assessment: Modified Independent                                            Pertinent Vitals/Pain Pain Assessment: No/denies pain    Home Living Family/patient expects to be discharged to:: Assisted living               Home Equipment: Walker - 2 wheels;Cane - single point;Shower seat Additional Comments: ambulated with a cane    Prior Function Level of Independence: Independent with assistive device(s)         Comments: does own self care. 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     Hand Dominance        Extremity/Trunk Assessment   Upper Extremity Assessment Upper Extremity Assessment: Overall WFL for tasks assessed    Lower Extremity Assessment Lower Extremity Assessment: Overall WFL for tasks assessed    Cervical / Trunk Assessment Cervical / Trunk Assessment: Normal  Communication   Communication: No difficulties  Cognition Arousal/Alertness: Awake/alert Behavior During Therapy: WFL for tasks assessed/performed Overall Cognitive Status: Within Functional Limits for tasks assessed (h/o dementia, oriented x 3)                      General Comments      Exercises     Assessment/Plan    PT Assessment Patient needs continued PT services  PT Problem List Decreased activity tolerance;Decreased mobility  PT Treatment Interventions Gait training;Therapeutic exercise;Therapeutic activities;Patient/family education    PT Goals (Current goals can be found in the Care Plan section)  Acute Rehab PT Goals Patient Stated Goal: get back to playing piano for church.  "I will probably miss this Sunday" PT Goal Formulation: With patient Time For Goal Achievement: 06/03/16 Potential to Achieve Goals: Good    Frequency Min 3X/week   Barriers to discharge        Co-evaluation               End of Session Equipment Utilized During Treatment: Gait belt Activity Tolerance: Patient tolerated treatment well Patient left: Other (comment);with call bell/phone within reach (on commode, RN  notified) Nurse Communication: Mobility status         Time: [id number] PT Time Calculation (min) (ACUTE ONLY): 11 min   Charges:   PT Evaluation $PT Eval Low Complexity: 1 Procedure     PT G Codes:        Philomena Doheny 05/20/2016, 11:29 AM [phone]

## 2016-05-20 NOTE — Progress Notes (Signed)
Pharmacist Heart Failure Core Measure Documentation  Assessment: [name] has an EF documented as 15-20% on 05/18/2016 by ECHO.  Rationale: Heart failure patients with left ventricular systolic dysfunction (LVSD) and an EF < 40% should be prescribed an angiotensin converting enzyme inhibitor (ACEI) or angiotensin receptor blocker (ARB) at discharge unless a contraindication is documented in the medical record.  This patient is not currently on an ACEI or ARB for HF.  This note is being placed in the record in order to provide documentation that a contraindication to the use of these agents is present for this encounter.  ACE Inhibitor or Angiotensin Receptor Blocker is contraindicated (specify all that apply)  []   ACEI allergy AND ARB allergy []   Angioedema []   Moderate or severe aortic stenosis []   Hyperkalemia []   Hypotension []   Renal artery stenosis [x]   Worsening renal function, preexisting renal disease or dysfunction - plans to resume at discharge, Losartan held for IV furosemide and bump in SCr   [name] 05/20/2016 1:19 PM

## 2016-05-20 NOTE — Progress Notes (Signed)
PROGRESS NOTE    [name]  [name] DOB: [birth year] DOA: 05/17/2016 PCP: Tivis Ringer, MD    Brief Narrative:  83 y.o. male with medical history significant for COPD, coronary artery disease, chronic combined CHF, dementia, hypertension, seizure disorder, urethral stricture with chronic indwelling Foley catheter, and blindness who presents from his nursing facility for evaluation of progressive cough and dyspnea over the past 3 days. Patient reports being in his usual state of health until approximately 3 days ago when he noted the insidious development of a cough and dyspnea with exertion. There has been no fevers or chills and no chest pain or palpitations. He reports his cough to be productive and his stepdaughter at the bedside and notes that sputum has been green and brownish. Patient denies rhinorrhea, sore throat, aches, or malaise. He reports that for the past 2 days, he has been getting short of breath while playing the piano and just prior to arrival was dyspneic while at rest. Given his blindness, he is uncertain about increased swelling.  Assessment & Plan:   Principal Problem:   Acute on chronic combined systolic and diastolic CHF (congestive heart failure) (H[facility]) Active Problems:   Hypertension   Seizure disorder ([facility])   CAD (coronary artery disease)   Hypoxia   Dementia   Bilateral leg edema   Hypervolemia   1. Acute on chronic combined systolic/diastolic CHF  - Pt presents with worsening dyspnea, noted to have peripheral edema, JVD, crackles, interstitial edema on CXR  - Patient normally follows with cardiology and is managed with Lasix 40 mg qD, losartan, Aldactone, and Coreg - Continue Coreg and Aldactone as tolerated, hold losartan while diuresing and following daily chem panels  - Continue to follow daily wts and I/Os, fluid-restrict diet and SLIV - Repeat 2d echo reviewed. Findings of EF 15-20% -Wt today 65.4kg -Wt yesterday 68.9kg -Wt on admit  68.5kg -Given severity of heart failure, Cardiology was consulted. Cardiology had recommended continued lasix IV BID with possible change to PO lasix on 2/18 - Will check BMET in AM  2. Hypertension  - BP at goal on admission  - Will continue patient on Coreg and Aldactone as tolerated; losartan has been held as above  - Stable at present  3. CAD  - History of stent to LAD, followed by cardiology  - No anginal complaints on admission  - Troponin is borderline elevated, likely secondary to AoC CHF, will trend - Continue to monitor on telemetry while undergoing diuresis  - Continue ASA 81, Lipitor, Coreg; losartan held on admission as above  - Currently stable  4. Dementia - Appears to be stable on admission; pt calm, cooperative, provides history  - Plan to continue Aricept as tolerated - Stable at present  5. Seizure disorder  - No recent seizures  - Continue Dilantin  - Remains stable at this time  DVT prophylaxis: Lovenox subQ Code Status: DNR Family Communication: Pt in room, family not at bedside Disposition Plan: Uncertain at this time  Consultants:   Cardiology  Procedures:     Antimicrobials: Anti-infectives    None      Subjective: No complaints. Feels better  Objective: Vitals:   05/20/16 1000 05/20/16 1107 05/20/16 1123 05/20/16 1359  BP: 106/66   119/79  Pulse:    71  Resp:    20  Temp:    97.5 F (36.4 C)  TempSrc:    Oral  SpO2:  (!) 89% 91% 97%  Weight:  Height:        Intake/Output Summary (Last 24 hours) at 05/20/16 1753 Last data filed at 05/20/16 1643  Gross per 24 hour  Intake              480 ml  Output             1400 ml  Net             -920 ml   Filed Weights   05/18/16 0514 05/19/16 0647 05/20/16 0536  Weight: 65.5 kg (144 lb 6.4 oz) 68.9 kg (151 lb 14.4 oz) 65.4 kg (144 lb 2.9 oz)    Examination:  General exam: Sitting in chair, conversant, in nad Respiratory system: Normal chest rise, no audible  wheezing Cardiovascular system: regular rhythm, s1-2 auscultated Gastrointestinal system: nontender, pos BS, nondistedned Central nervous system: no seizures, no tremors Extremities: no cyanosis, no joint deformities Skin: no rashes, no pallor Psychiatry: affect appears normal/ no auditory hallucinations.   Data Reviewed: I have personally reviewed following labs and imaging studies  CBC:  Recent Labs Lab 05/17/16 1916  WBC 7.6  NEUTROABS 4.9  HGB 13.2  HCT 38.9*  MCV 89.6  PLT [id number]   Basic Metabolic Panel:  Recent Labs Lab 05/17/16 1916 05/19/16 0519 05/20/16 0509  NA 140 142 140  K 3.8 4.0 4.4  CL 110 111 110  CO2 23 25 26   GLUCOSE 93 83 78  BUN 17 19 20   CREATININE 1.12 1.33* 1.17  CALCIUM 9.0 9.0 8.9   GFR: Estimated Creatinine Clearance: 45.8 mL/min (by C-G formula based on SCr of 1.17 mg/dL). Liver Function Tests: No results for input(s): AST, ALT, ALKPHOS, BILITOT, PROT, ALBUMIN in the last 168 hours. No results for input(s): LIPASE, AMYLASE in the last 168 hours. No results for input(s): AMMONIA in the last 168 hours. Coagulation Profile: No results for input(s): INR, PROTIME in the last 168 hours. Cardiac Enzymes:  Recent Labs Lab 05/17/16 1916 05/18/16 0111 05/18/16 0529 05/18/16 1228  TROPONINI 0.04* 0.04* 0.04* 0.04*   BNP (last 3 results) No results for input(s): PROBNP in the last 8760 hours. HbA1C: No results for input(s): HGBA1C in the last 72 hours. CBG: No results for input(s): GLUCAP in the last 168 hours. Lipid Profile: No results for input(s): CHOL, HDL, LDLCALC, TRIG, CHOLHDL, LDLDIRECT in the last 72 hours. Thyroid Function Tests: No results for input(s): TSH, T4TOTAL, FREET4, T3FREE, THYROIDAB in the last 72 hours. Anemia Panel: No results for input(s): VITAMINB12, FOLATE, FERRITIN, TIBC, IRON, RETICCTPCT in the last 72 hours. Sepsis Labs: No results for input(s): PROCALCITON, LATICACIDVEN in the last 168 hours.  Recent  Results (from the past 240 hour(s))  MRSA PCR Screening     Status: None   Collection Time: 05/18/16  5:55 AM  Result Value Ref Range Status   MRSA by PCR NEGATIVE NEGATIVE Final    Comment:        The GeneXpert MRSA Assay (FDA approved for NASAL specimens only), is one component of a comprehensive MRSA colonization surveillance program. It is not intended to diagnose MRSA infection nor to guide or monitor treatment for MRSA infections.      Radiology Studies: No results found.  Scheduled Meds: . aspirin  81 mg Oral Daily  . atorvastatin  10 mg Oral Daily  . carvedilol  6.25 mg Oral BID WC  . cholecalciferol  1,000 Units Oral Daily  . donepezil  10 mg Oral QHS  . enoxaparin (LOVENOX) injection  40 mg Subcutaneous QHS  . fluticasone furoate-vilanterol  1 puff Inhalation Q breakfast  . furosemide  40 mg Intravenous Daily  . mirabegron ER  50 mg Oral Q breakfast  . phenytoin  200 mg Oral BID  . potassium chloride  20 mEq Oral BID  . sodium chloride flush  3 mL Intravenous Q12H  . spironolactone  25 mg Oral q morning - 10a   Continuous Infusions:   LOS: 2 days   Codi Folkerts, Orpah Melter, MD [name] Pager [phone]  If 7PM-7AM, please contact night-coverage [email or web address] Password [facility] 05/20/2016, 5:53 PM

## 2016-05-21 LAB — BASIC METABOLIC PANEL
ANION GAP: 5 (ref 5–15)
BUN: 19 mg/dL (ref 6–20)
CHLORIDE: 110 mmol/L (ref 101–111)
CO2: 23 mmol/L (ref 22–32)
Calcium: 8.9 mg/dL (ref 8.9–10.3)
Creatinine, Ser: 1.09 mg/dL (ref 0.61–1.24)
GFR calc Af Amer: >60 mL/min (ref >60)
GLUCOSE: 79 mg/dL (ref 65–99)
POTASSIUM: 4.5 mmol/L (ref 3.5–5.1)
Sodium: 138 mmol/L (ref 135–145)

## 2016-05-21 MED ORDER — SIMETHICONE 80 MG PO CHEW
80.0000 mg | CHEWABLE_TABLET | Freq: Once | ORAL | Status: AC
  Administered 2016-05-21: 80 mg via ORAL
  Filled 2016-05-21: qty 1

## 2016-05-21 NOTE — Progress Notes (Signed)
PROGRESS NOTE    [name]  [id number] DOB: November 26, 1932 DOA: 05/17/2016 PCP: Tivis Ringer, MD    Brief Narrative:  83 y.o. male with medical history significant for COPD, coronary artery disease, chronic combined CHF, dementia, hypertension, seizure disorder, urethral stricture with chronic indwelling Foley catheter, and blindness who presents from his nursing facility for evaluation of progressive cough and dyspnea over the past 3 days. Patient reports being in his usual state of health until approximately 3 days ago when he noted the insidious development of a cough and dyspnea with exertion. There has been no fevers or chills and no chest pain or palpitations. He reports his cough to be productive and his stepdaughter at the bedside and notes that sputum has been green and brownish. Patient denies rhinorrhea, sore throat, aches, or malaise. He reports that for the past 2 days, he has been getting short of breath while playing the piano and just prior to arrival was dyspneic while at rest. Given his blindness, he is uncertain about increased swelling.  Assessment & Plan:   Principal Problem:   Acute on chronic combined systolic and diastolic CHF (congestive heart failure) (HCC) Active Problems:   Hypertension   Seizure disorder (HCC)   CAD (coronary artery disease)   Hypoxia   Dementia   Bilateral leg edema   Hypervolemia   1. Acute on chronic combined systolic/diastolic CHF  - Pt presents with worsening dyspnea, noted to have peripheral edema, JVD, crackles, interstitial edema on CXR  - Patient normally follows with cardiology and is managed with Lasix 40 mg qD, losartan, Aldactone, and Coreg - Continue Coreg and Aldactone as tolerated, hold losartan while diuresing and following daily chem panels  - Continue to follow daily wts and I/Os, fluid-restrict diet and SLIV - Repeat 2d echo reviewed. Findings of EF 15-20% -Weights seem to fluctuate daily, uncertain of accuracy   - Net neg 3.8L this AM -Given severity of heart failure, Cardiology was consulted. Cardiology had recommended continued lasix IV BID with possible change to PO lasix on 2/18 - Follow up BMET in AM  2. Hypertension  - BP at goal on admission  - Will continue patient on Coreg and Aldactone as tolerated; losartan has been held as above  - Currently stable  3. CAD  - History of stent to LAD, followed by cardiology  - No anginal complaints on admission  - Troponin is borderline elevated, likely secondary to AoC CHF, will trend - Continue to monitor on telemetry while undergoing diuresis  - Continue ASA 81, Lipitor, Coreg; losartan held on admission as above  - Denies chest pain, stable at present  4. Dementia - Appears to be stable on admission; pt calm, cooperative, provides history  - Plan to continue Aricept as tolerated - Currently stable  5. Seizure disorder  - No recent seizures  - Continue Dilantin  - Presently stable  DVT prophylaxis: Lovenox subQ Code Status: DNR Family Communication: Pt in room, family not at bedside Disposition Plan: Uncertain at this time  Consultants:   Cardiology  Procedures:     Antimicrobials: Anti-infectives    None      Subjective: Without complaints.  Objective: Vitals:   05/21/16 0523 05/21/16 0925 05/21/16 1433 05/21/16 1651  BP: 110/71  111/69 119/69  Pulse: 77 74 70 75  Resp: 20 17 18    Temp: 98.1 F (36.7 C)  98.5 F (36.9 C)   TempSrc: Oral  Oral   SpO2: 96% 91% 98% 97%  Weight: 67.5 kg (148 lb 13 oz)     Height:        Intake/Output Summary (Last 24 hours) at 05/21/16 1743 Last data filed at 05/21/16 1606  Gross per 24 hour  Intake              720 ml  Output             1475 ml  Net             -755 ml   Filed Weights   05/19/16 0647 05/20/16 0536 05/21/16 0523  Weight: 68.9 kg (151 lb 14.4 oz) 65.4 kg (144 lb 2.9 oz) 67.5 kg (148 lb 13 oz)    Examination:  General exam: asleep, easily  arousable, in nad Respiratory system: Normal respiratory effort, no wheezing Cardiovascular system: regular rate, s1-2 on auscultation Gastrointestinal system: soft, nondistended, pos bs Central nervous system: cn2-12 grossly intact, strength intact Extremities: perfused, no clubbing Skin: normal skin turgor, no notable skin lesions seen Psychiatry: mood appears normal/ no visual hallucinations.   Data Reviewed: I have personally reviewed following labs and imaging studies  CBC:  Recent Labs Lab 05/17/16 1916  WBC 7.6  NEUTROABS 4.9  HGB 13.2  HCT 38.9*  MCV 89.6  PLT [id number]   Basic Metabolic Panel:  Recent Labs Lab 05/17/16 1916 05/19/16 0519 05/20/16 0509 05/21/16 0509  NA 140 142 140 138  K 3.8 4.0 4.4 4.5  CL 110 111 110 110  CO2 23 25 26 23   GLUCOSE 93 83 78 79  BUN 17 19 20 19   CREATININE 1.12 1.33* 1.17 1.09  CALCIUM 9.0 9.0 8.9 8.9   GFR: Estimated Creatinine Clearance: 50.7 mL/min (by C-G formula based on SCr of 1.09 mg/dL). Liver Function Tests: No results for input(s): AST, ALT, ALKPHOS, BILITOT, PROT, ALBUMIN in the last 168 hours. No results for input(s): LIPASE, AMYLASE in the last 168 hours. No results for input(s): AMMONIA in the last 168 hours. Coagulation Profile: No results for input(s): INR, PROTIME in the last 168 hours. Cardiac Enzymes:  Recent Labs Lab 05/17/16 1916 05/18/16 0111 05/18/16 0529 05/18/16 1228  TROPONINI 0.04* 0.04* 0.04* 0.04*   BNP (last 3 results) No results for input(s): PROBNP in the last 8760 hours. HbA1C: No results for input(s): HGBA1C in the last 72 hours. CBG: No results for input(s): GLUCAP in the last 168 hours. Lipid Profile: No results for input(s): CHOL, HDL, LDLCALC, TRIG, CHOLHDL, LDLDIRECT in the last 72 hours. Thyroid Function Tests: No results for input(s): TSH, T4TOTAL, FREET4, T3FREE, THYROIDAB in the last 72 hours. Anemia Panel: No results for input(s): VITAMINB12, FOLATE, FERRITIN, TIBC,  IRON, RETICCTPCT in the last 72 hours. Sepsis Labs: No results for input(s): PROCALCITON, LATICACIDVEN in the last 168 hours.  Recent Results (from the past 240 hour(s))  MRSA PCR Screening     Status: None   Collection Time: 05/18/16  5:55 AM  Result Value Ref Range Status   MRSA by PCR NEGATIVE NEGATIVE Final    Comment:        The GeneXpert MRSA Assay (FDA approved for NASAL specimens only), is one component of a comprehensive MRSA colonization surveillance program. It is not intended to diagnose MRSA infection nor to guide or monitor treatment for MRSA infections.      Radiology Studies: No results found.  Scheduled Meds: . aspirin  81 mg Oral Daily  . atorvastatin  10 mg Oral Daily  . carvedilol  6.25 mg Oral BID  WC  . cholecalciferol  1,000 Units Oral Daily  . donepezil  10 mg Oral QHS  . enoxaparin (LOVENOX) injection  40 mg Subcutaneous QHS  . fluticasone furoate-vilanterol  1 puff Inhalation Q breakfast  . furosemide  40 mg Intravenous Daily  . mirabegron ER  50 mg Oral Q breakfast  . phenytoin  200 mg Oral BID  . potassium chloride  20 mEq Oral BID  . sodium chloride flush  3 mL Intravenous Q12H  . spironolactone  25 mg Oral q morning - 10a   Continuous Infusions:   LOS: 3 days   CHIU, Orpah Melter, MD [name] Pager [phone]  If 7PM-7AM, please contact night-coverage [email or web address] Password [facility] 05/21/2016, 5:43 PM

## 2016-05-22 DIAGNOSIS — R0902 Hypoxemia: Secondary | ICD-10-CM

## 2016-05-22 LAB — BASIC METABOLIC PANEL
ANION GAP: 4 — AB (ref 5–15)
BUN: 23 mg/dL — ABNORMAL HIGH (ref 6–20)
CALCIUM: 9.2 mg/dL (ref 8.9–10.3)
CO2: 24 mmol/L (ref 22–32)
Chloride: 109 mmol/L (ref 101–111)
Creatinine, Ser: 1.13 mg/dL (ref 0.61–1.24)
GFR calc Af Amer: >60 mL/min (ref >60)
GFR, EST NON AFRICAN AMERICAN: 59 mL/min — AB (ref >60)
Glucose, Bld: 80 mg/dL (ref 65–99)
Potassium: 4.9 mmol/L (ref 3.5–5.1)
Sodium: 137 mmol/L (ref 135–145)

## 2016-05-22 MED ORDER — FUROSEMIDE 40 MG PO TABS
40.0000 mg | ORAL_TABLET | Freq: Every day | ORAL | Status: DC
Start: 2016-05-23 — End: 2016-05-22

## 2016-05-22 NOTE — Progress Notes (Signed)
Physical Therapy Treatment Patient Details Name: [name] MRN: [id number] DOB: 09/30/1932 Today's Date: 05/22/2016    History of Present Illness Pt was admitted for SOB and diagnosed with acute or chronic CHF.  PMH:  dementia, HTN, COPD, seizues, indwelling catheter and legal blindness    PT Comments    Assisted with amb a great distance in hallway using a RW.  Fairly steady with walker.  Avg HR 82 and RA 97%.  Returned to room then assisted to bathroom.  MinGuard Assist for hygiene.   Assisted out of bathroom to recliner.  Pt stated he plans to go back to using his walker at ALF then return to his cane at a later time when "I feel more steady".   Therapist agrees.    Follow Up Recommendations  No PT follow up  Return to ALF on [place] Recommendations  None recommended by PT (has a walker and a cane)    Recommendations for Other Services       Precautions / Restrictions Precautions Precautions: Fall Precaution Comments: denies falls in past 1 year Restrictions Weight Bearing Restrictions: No    Mobility  Bed Mobility               General bed mobility comments: OOB in recliner  Transfers Overall transfer level: Needs assistance Equipment used: Rolling walker (2 wheeled) Transfers: Sit to/from Stand Sit to Stand: Min guard         General transfer comment: cues for UE placement, min/guard safety  Ambulation/Gait Ambulation/Gait assistance: Min guard Ambulation Distance (Feet): 165 Feet Assistive device: Rolling walker (2 wheeled) Gait Pattern/deviations: Step-to pattern;Step-through pattern;Shuffle;Trunk flexed Gait velocity: WFL   General Gait Details: 25% VC's on safety with turns using walker.  Pt admits to using his cane and wall rail at ALF prior to amb to dinning room.    Stairs            Wheelchair Mobility    Modified Rankin (Stroke Patients Only)       Balance                                     Cognition Arousal/Alertness: Awake/alert Behavior During Therapy: WFL for tasks assessed/performed Overall Cognitive Status: Within Functional Limits for tasks assessed                      Exercises      General Comments        Pertinent Vitals/Pain Pain Assessment: No/denies pain    Home Living                      Prior Function            PT Goals (current goals can now be found in the care plan section) Progress towards PT goals: Progressing toward goals    Frequency    Min 3X/week      PT Plan Current plan remains appropriate    Co-evaluation             End of Session Equipment Utilized During Treatment: Gait belt Activity Tolerance: Patient tolerated treatment well Patient left: in chair;with call bell/phone within reach;with nursing/sitter in room Nurse Communication: Mobility status PT Visit Diagnosis: Unsteadiness on feet (R26.81)     Time: [id number] PT Time Calculation (min) (ACUTE ONLY): 24 min  Charges:  $  Gait Training: 8-22 mins $Therapeutic Activity: 8-22 mins                    G Codes:       Rica Koyanagi  PTA [facility]  Acute  Rehab Pager      [phone]

## 2016-05-22 NOTE — Clinical Social Work Note (Signed)
Medical Social Worker facilitated patient discharge including contacting patient family (left a message with patient's dtr/POA) and facility to confirm patient discharge plans.  Clinical information faxed to facility and family agreeable with plan. Per RN, patient's dtr/POA will transport patient back to [facility].  RN to call report prior to discharge.  PTAR packet prepared and on chart in the event patient requires transportation via ambulance.   Medical Social Worker will sign off for now as social work intervention is no longer needed. Please consult [place] again if new need arises.  Glendon Axe, MSW [phone] 05/22/2016 2:33 PM

## 2016-05-22 NOTE — Progress Notes (Signed)
Occupational Therapy Treatment Patient Details Name: [name] MRN: [id number] DOB: 15-Oct-1932 Today's Date: 05/22/2016    History of present illness Pt was admitted for SOB and diagnosed with acute or chronic CHF.  PMH:  dementia, HTN, COPD, seizues, indwelling catheter and legal blindness   OT comments  Pt states therapist at ALF will work with him when he returns.  OT agrees with this plan           Precautions / Restrictions Precautions Precautions: Fall Precaution Comments: denies falls in past 1 year Restrictions Weight Bearing Restrictions: No       Mobility Bed Mobility               General bed mobility comments: oob  Transfers   Equipment used: Rolling walker (2 wheeled) Transfers: Sit to/from Stand Sit to Stand: Min guard         General transfer comment: cues for UE placement, min/guard safety    Balance                                   ADL Overall ADL's : Needs assistance/impaired                     Lower Body Dressing: Supervision/safety;Sit to/from stand;Cueing for safety;Cueing for sequencing   Toilet Transfer: Supervision/safety;RW;Ambulation;Cueing for sequencing;Comfort height toilet;BSC   Toileting- Clothing Manipulation and Hygiene: Supervision/safety;Sit to/from stand;Cueing for safety;Cueing for sequencing;Cueing for compensatory techniques       Functional mobility during ADLs: Supervision/safety;Cueing for safety;Cueing for sequencing;Caregiver able to provide necessary level of assistance;Rolling walker                  Cognition   Behavior During Therapy: [facility] for tasks assessed/performed Overall Cognitive Status: Within Functional Limits for tasks assessed (h/o dementia, oriented x 3)                              Frequency           Progress Toward Goals  OT Goals(current goals can now be found in the care plan section)  Progress towards OT goals: Progressing toward  goals     Plan Discharge plan remains appropriate       End of Session    OT Visit Diagnosis: Muscle weakness (generalized) (M62.81)   Activity Tolerance     Patient Left     Nurse Communication  in chair with family present with cal bell with in reach        Time: 1312-1325 OT Time Calculation (min): 13 min  Charges: OT General Charges $OT Visit: 1 Procedure OT Treatments $Self Care/Home Management : 8-22 mins     [name], [name] 05/22/2016, 1:51 PM

## 2016-05-22 NOTE — Discharge Summary (Signed)
Physician Discharge Summary  [name] [name] DOB: [birth year] DOA: 05/17/2016  PCP: Tivis Ringer, MD  Admit date: 05/17/2016 Discharge date: 05/22/2016  Admitted From: SNF Disposition:  SNF  Recommendations for Outpatient Follow-up:  1. Follow up with PCP in 1-2 weeks 2. Follow up with Dr. Johnsie Cancel as scheduled 3. Recommend repeat bmet in 1-2 weeks  Discharge Condition:Improved CODE STATUS:DNR Diet recommendation: Heart healthy/low sodium   Brief/Interim Summary: 83 y.o.malewith medical history significant for COPD, coronary artery disease, chronic combined CHF, dementia, hypertension, seizure disorder, urethral stricture with chronic indwelling Foley catheter, and blindness who presents from his nursing facility for evaluation of progressive cough and dyspnea over the past 3 days. Patient reports being in his usual state of health until approximately 3 days ago when he noted the insidious development of a cough and dyspnea with exertion. There has been no fevers or chills and no chest pain or palpitations. He reports his cough to be productive and his stepdaughter at the bedside and notes that sputum has been green and brownish. Patient denies rhinorrhea, sore throat, aches, or malaise. He reports that for the past 2 days, he has been getting short of breath while playing the pianoand just prior to arrival was dyspneic while at rest. Given his blindness, he is uncertain about increased swelling.  1. Acute on chronic combined systolic/diastolic CHF  - Pt presented with worsening dyspnea, noted to have peripheral edema, JVD, crackles, interstitial edema on CXR  - Patient normally follows with cardiology and is managed with Lasix 40 mg qD, losartan, Aldactone, and Coreg - Continued Coreg and Aldactone as tolerated, briefly held losartan while diuresing - Continue to follow daily wts and I/Os, fluid-restrict diet and SLIV - Repeat 2d echo reviewed. Findings of EF  15-20% -Weights seem to fluctuate daily, uncertain of accuracy  -Given severity of heart failure, Cardiology was consulted. Cardiology had recommended continued lasix IV BID with change to PO lasix on 2/19 -Discussed with Cardiology. Patient is clear for discharge today with close follow up with primary Cardiologist, Dr. Johnsie Cancel  2. Hypertension  - BP at goal on admission  - Will continue patient on Coreg and Aldactone as tolerated; losartan was briefly held as above  - Currently stable  3. CAD  - History of stent to LAD, followed by cardiology  - No anginal complaints on admission  - Troponin is borderline elevated, likely secondary to AoC CHF, will trend - Continued to monitor on telemetry while undergoing diuresis  - Continued ASA 81, Lipitor, Coreg; losartan held on admission as above - Denies chest pain, stable   4. Dementia - Appears to be stable on admission; pt calm, cooperative, provides history  - Plan to continue Aricept as tolerated - Currently stable  5. Seizure disorder  - No recent seizures  - Continue Dilantin  - Presently stable  Discharge Diagnoses:  Principal Problem:   Acute on chronic combined systolic and diastolic CHF (congestive heart failure) (HCC) Active Problems:   Hypertension   Seizure disorder (HCC)   CAD (coronary artery disease)   Hypoxia   Dementia   Bilateral leg edema   Hypervolemia    Discharge Instructions   Allergies as of 05/22/2016      Reactions   Red Dye Other (See Comments)   Per Mar.   Iodinated Diagnostic Agents Rash   Pt given 50 mg Benadryl prior to Omnipaque injection, tolerated well.      Medication List    TAKE these medications  acetaminophen 500 MG tablet Commonly known as:  TYLENOL Take 1,000 mg by mouth every 8 (eight) hours as needed for moderate pain.   aspirin 81 MG chewable tablet Chew 81 mg by mouth daily.   atorvastatin 10 MG tablet Commonly known as:  LIPITOR Take 10 mg by mouth daily.    bismuth subsalicylate [id number] suspension Commonly known as:  PEPTO BISMOL Take 30 mLs by mouth as needed for diarrhea or loose stools.   carvedilol 6.25 MG tablet Commonly known as:  COREG Take 6.25 mg by mouth 2 (two) times daily with a meal.   cholecalciferol 1000 units tablet Commonly known as:  VITAMIN D Take 1,000 Units by mouth daily.   DELSYM 30 MG/5ML liquid Generic drug:  dextromethorphan Take 10 mLs by mouth as needed for cough.   donepezil 10 MG tablet Commonly known as:  ARICEPT Take 10 mg by mouth at bedtime.   fluticasone furoate-vilanterol 100-25 MCG/INH Aepb Commonly known as:  BREO ELLIPTA Inhale 1 puff into the lungs daily with breakfast.   furosemide 40 MG tablet Commonly known as:  LASIX Take 1 tablet (40 mg total) by mouth daily.   loperamide 2 MG capsule Commonly known as:  IMODIUM Take 1 capsule (2 mg total) by mouth 4 (four) times daily as needed for diarrhea or loose stools.   losartan 25 MG tablet Commonly known as:  COZAAR Take 25 mg by mouth daily.   mirabegron ER 50 MG Tb24 tablet Commonly known as:  MYRBETRIQ Take 50 mg by mouth daily with breakfast.   nitroGLYCERIN 0.3 MG SL tablet Commonly known as:  NITROSTAT Place 0.3 mg under the tongue every 5 (five) minutes as needed for chest pain.   ORAJEL MOUTH-AID 20 % Gel Generic drug:  benzocaine Use as directed 1 application in the mouth or throat 4 (four) times daily as needed (gum pain).   phenytoin 100 MG ER capsule Commonly known as:  DILANTIN Take 200 mg by mouth 2 (two) times daily.   PREPARATION H HYDROCORTISONE 1 % Generic drug:  hydrocortisone cream Apply 1 application topically 2 (two) times daily as needed. For hemmoroids.   spironolactone 25 MG tablet Commonly known as:  ALDACTONE Take 25 mg by mouth every morning.      Follow-up Information    Tivis Ringer, MD. Schedule an appointment as soon as possible for a visit in 1 week(s).   Specialty:  Internal  Medicine Contact information: [place] [address] [phone]        Jenkins Rouge, MD Follow up.   Specialty:  Cardiology Why:  Follow up as scheduled Contact information: [address] [place] [address] [phone]          Allergies  Allergen Reactions  . Red Dye Other (See Comments)    Per Mar.  . Iodinated Diagnostic Agents Rash    Pt given 50 mg Benadryl prior to Omnipaque injection, tolerated well.    Consultations:  Cardiology  Procedures/Studies: Dg Chest 2 View  Result Date: 05/17/2016 CLINICAL DATA:  Dyspnea x3 days with cough.  No fever. EXAM: CHEST  2 VIEW COMPARISON:  12/04/2015 CXR FINDINGS: Stable cardiomegaly with tortuous thoracic aorta. Mild interstitial edema. Faint subpleural areas of interstitial fibrosis bilaterally especially at the lung bases. Trace edema along the minor fissure with evolving atelectatic change. Old right-sided rib fractures. No acute nor suspicious osseous abnormality. No pneumothorax or effusion. IMPRESSION: Cardiomegaly with mild interstitial edema since prior exam. Trace  edema/fluid in the minor fissure with adjacent area bandlike atelectasis. Electronically Signed   By: Ashley Royalty M.D.   On: 05/17/2016 17:50     Subjective: Eager to go home  Discharge Exam: Vitals:   05/22/16 0758 05/22/16 0849  BP: 116/72   Pulse: 67 77  Resp:  16  Temp:     Vitals:   05/21/16 1950 05/22/16 0531 05/22/16 0758 05/22/16 0849  BP: 110/66 120/77 116/72   Pulse: 74 72 67 77  Resp: 19 18  16   Temp: 97.6 F (36.4 C) 98.8 F (37.1 C)    TempSrc: Oral Oral    SpO2: 97% 98%  97%  Weight:  69 kg (152 lb 1.9 oz)    Height:        General: Pt is alert, awake, not in acute distress Cardiovascular: RRR, S1/S2 +, no rubs, no gallops Respiratory: CTA bilaterally, no wheezing, no rhonchi Abdominal: Soft, NT, ND, bowel sounds + Extremities: no edema, no cyanosis   The results of significant  diagnostics from this hospitalization (including imaging, microbiology, ancillary and laboratory) are listed below for reference.     Microbiology: Recent Results (from the past 240 hour(s))  MRSA PCR Screening     Status: None   Collection Time: 05/18/16  5:55 AM  Result Value Ref Range Status   MRSA by PCR NEGATIVE NEGATIVE Final    Comment:        The GeneXpert MRSA Assay (FDA approved for NASAL specimens only), is one component of a comprehensive MRSA colonization surveillance program. It is not intended to diagnose MRSA infection nor to guide or monitor treatment for MRSA infections.      Labs: BNP (last 3 results)  Recent Labs  12/03/15 0701 05/17/16 1916  BNP 787.8* 0000000*   Basic Metabolic Panel:  Recent Labs Lab 05/17/16 1916 05/19/16 0519 05/20/16 0509 05/21/16 0509 05/22/16 0451  NA 140 142 140 138 137  K 3.8 4.0 4.4 4.5 4.9  CL 110 111 110 110 109  CO2 23 25 26 23 24   GLUCOSE 93 83 78 79 80  BUN 17 19 20 19  23*  CREATININE 1.12 1.33* 1.17 1.09 1.13  CALCIUM 9.0 9.0 8.9 8.9 9.2   Liver Function Tests: No results for input(s): AST, ALT, ALKPHOS, BILITOT, PROT, ALBUMIN in the last 168 hours. No results for input(s): LIPASE, AMYLASE in the last 168 hours. No results for input(s): AMMONIA in the last 168 hours. CBC:  Recent Labs Lab 05/17/16 1916  WBC 7.6  NEUTROABS 4.9  HGB 13.2  HCT 38.9*  MCV 89.6  PLT 235   Cardiac Enzymes:  Recent Labs Lab 05/17/16 1916 05/18/16 0111 05/18/16 0529 05/18/16 1228  TROPONINI 0.04* 0.04* 0.04* 0.04*   BNP: Invalid input(s): POCBNP CBG: No results for input(s): GLUCAP in the last 168 hours. D-Dimer No results for input(s): DDIMER in the last 72 hours. Hgb A1c No results for input(s): HGBA1C in the last 72 hours. Lipid Profile No results for input(s): CHOL, HDL, LDLCALC, TRIG, CHOLHDL, LDLDIRECT in the last 72 hours. Thyroid function studies No results for input(s): TSH, T4TOTAL, T3FREE,  THYROIDAB in the last 72 hours.  Invalid input(s): FREET3 Anemia work up No results for input(s): VITAMINB12, FOLATE, FERRITIN, TIBC, IRON, RETICCTPCT in the last 72 hours. Urinalysis    Component Value Date/Time   COLORURINE YELLOW 02/27/2016 0015   APPEARANCEUR CLEAR 02/27/2016 0015   LABSPEC 1.009 02/27/2016 0015   PHURINE 5.5 02/27/2016 0015   GLUCOSEU NEGATIVE  02/27/2016 0015   HGBUR SMALL (A) 02/27/2016 0015   BILIRUBINUR NEGATIVE 02/27/2016 0015   KETONESUR NEGATIVE 02/27/2016 0015   PROTEINUR NEGATIVE 02/27/2016 0015   UROBILINOGEN 0.2 08/08/2014 1109   NITRITE NEGATIVE 02/27/2016 0015   LEUKOCYTESUR MODERATE (A) 02/27/2016 0015   Sepsis Labs Invalid input(s): PROCALCITONIN,  WBC,  LACTICIDVEN Microbiology Recent Results (from the past 240 hour(s))  MRSA PCR Screening     Status: None   Collection Time: 05/18/16  5:55 AM  Result Value Ref Range Status   MRSA by PCR NEGATIVE NEGATIVE Final    Comment:        The GeneXpert MRSA Assay (FDA approved for NASAL specimens only), is one component of a comprehensive MRSA colonization surveillance program. It is not intended to diagnose MRSA infection nor to guide or monitor treatment for MRSA infections.      SIGNED:   Donne Hazel, MD  [name] 05/22/2016, 12:43 PM  If 7PM-7AM, please contact night-coverage [email or web address] Password [email or web address]

## 2016-05-22 NOTE — Progress Notes (Addendum)
Patient discharge back to ALF and transported to the facility by step-daughter, reviewed discharge instructions with daughter over the phone, and discharge report given to [place] at facility. And discharge packet given to patient for the facility and daughter. Patient denies any pain/distress. Skin intact, no wound noted.

## 2016-05-29 ENCOUNTER — Telehealth: Payer: Self-pay | Admitting: Cardiovascular Disease

## 2016-05-29 NOTE — Telephone Encounter (Signed)
New Message  Pt c/o medication issue:  1. Name of Medication: lasix 40 mg tablet once daily  2. How are you currently taking this medication (dosage and times per day)? See above  3. Are you having a reaction (difficulty breathing--STAT)? N?A  4. What is your medication issue? Pts step daughter voiced calling in regards to dosage increase that has not happened and following up.

## 2016-05-29 NOTE — Telephone Encounter (Signed)
Called Jorge Mandril ([place]) patient's step daughter. She had thought Dr. Johnsie Cancel increased patient's Lasix at the hospital. Informed her that patient's IV lasix was increased while in the hospital, but on discharge patient was instructed to take lasix 40 mg by mouth daily. [name] verbalized understanding and had no other questions at this time.

## 2016-05-30 ENCOUNTER — Ambulatory Visit: Payer: Medicare Other | Admitting: Physician Assistant

## 2016-06-13 ENCOUNTER — Inpatient Hospital Stay (HOSPITAL_COMMUNITY)
Admission: EM | Admit: 2016-06-13 | Discharge: 2016-06-16 | DRG: 193 | Disposition: A | Discharge status: Home Health | Payer: Medicare Other | Attending: Internal Medicine | Admitting: Internal Medicine

## 2016-06-13 ENCOUNTER — Encounter (HOSPITAL_COMMUNITY): Payer: Self-pay | Admitting: Emergency Medicine

## 2016-06-13 ENCOUNTER — Emergency Department (HOSPITAL_COMMUNITY): Payer: Medicare Other

## 2016-06-13 DIAGNOSIS — H548 Legal blindness, as defined in USA: Secondary | ICD-10-CM | POA: Diagnosis present

## 2016-06-13 DIAGNOSIS — I10 Essential (primary) hypertension: Secondary | ICD-10-CM | POA: Diagnosis not present

## 2016-06-13 DIAGNOSIS — J440 Chronic obstructive pulmonary disease with acute lower respiratory infection: Secondary | ICD-10-CM | POA: Diagnosis present

## 2016-06-13 DIAGNOSIS — E7800 Pure hypercholesterolemia, unspecified: Secondary | ICD-10-CM | POA: Diagnosis present

## 2016-06-13 DIAGNOSIS — G40909 Epilepsy, unspecified, not intractable, without status epilepticus: Secondary | ICD-10-CM | POA: Diagnosis present

## 2016-06-13 DIAGNOSIS — Z7982 Long term (current) use of aspirin: Secondary | ICD-10-CM

## 2016-06-13 DIAGNOSIS — I252 Old myocardial infarction: Secondary | ICD-10-CM

## 2016-06-13 DIAGNOSIS — Z955 Presence of coronary angioplasty implant and graft: Secondary | ICD-10-CM

## 2016-06-13 DIAGNOSIS — J9601 Acute respiratory failure with hypoxia: Secondary | ICD-10-CM | POA: Diagnosis present

## 2016-06-13 DIAGNOSIS — Z9981 Dependence on supplemental oxygen: Secondary | ICD-10-CM | POA: Diagnosis present

## 2016-06-13 DIAGNOSIS — I110 Hypertensive heart disease with heart failure: Secondary | ICD-10-CM | POA: Diagnosis not present

## 2016-06-13 DIAGNOSIS — R0902 Hypoxemia: Secondary | ICD-10-CM | POA: Diagnosis not present

## 2016-06-13 DIAGNOSIS — Z79899 Other long term (current) drug therapy: Secondary | ICD-10-CM | POA: Diagnosis not present

## 2016-06-13 DIAGNOSIS — I5042 Chronic combined systolic (congestive) and diastolic (congestive) heart failure: Secondary | ICD-10-CM | POA: Diagnosis present

## 2016-06-13 DIAGNOSIS — I5043 Acute on chronic combined systolic (congestive) and diastolic (congestive) heart failure: Secondary | ICD-10-CM | POA: Diagnosis not present

## 2016-06-13 DIAGNOSIS — F0390 Unspecified dementia without behavioral disturbance: Secondary | ICD-10-CM | POA: Diagnosis present

## 2016-06-13 DIAGNOSIS — R338 Other retention of urine: Secondary | ICD-10-CM | POA: Diagnosis present

## 2016-06-13 DIAGNOSIS — J189 Pneumonia, unspecified organism: Secondary | ICD-10-CM | POA: Diagnosis present

## 2016-06-13 DIAGNOSIS — Y95 Nosocomial condition: Secondary | ICD-10-CM | POA: Diagnosis present

## 2016-06-13 DIAGNOSIS — Z66 Do not resuscitate: Secondary | ICD-10-CM | POA: Diagnosis present

## 2016-06-13 DIAGNOSIS — J449 Chronic obstructive pulmonary disease, unspecified: Secondary | ICD-10-CM | POA: Diagnosis not present

## 2016-06-13 DIAGNOSIS — Z87891 Personal history of nicotine dependence: Secondary | ICD-10-CM

## 2016-06-13 DIAGNOSIS — I2510 Atherosclerotic heart disease of native coronary artery without angina pectoris: Secondary | ICD-10-CM | POA: Diagnosis present

## 2016-06-13 DIAGNOSIS — R0602 Shortness of breath: Secondary | ICD-10-CM

## 2016-06-13 DIAGNOSIS — J9621 Acute and chronic respiratory failure with hypoxia: Secondary | ICD-10-CM | POA: Diagnosis not present

## 2016-06-13 DIAGNOSIS — I509 Heart failure, unspecified: Secondary | ICD-10-CM

## 2016-06-13 LAB — CBC WITH DIFFERENTIAL/PLATELET
BASOS ABS: 0.1 10*3/uL (ref 0.0–0.1)
Basophils Relative: 1 %
EOS ABS: 0.5 10*3/uL (ref 0.0–0.7)
EOS PCT: 4 %
HCT: 39.2 % (ref 39.0–52.0)
Hemoglobin: 12.9 g/dL — ABNORMAL LOW (ref 13.0–17.0)
LYMPHS ABS: 0.9 10*3/uL (ref 0.7–4.0)
LYMPHS PCT: 8 %
MCH: 29.7 pg (ref 26.0–34.0)
MCHC: 32.9 g/dL (ref 30.0–36.0)
MCV: 90.1 fL (ref 78.0–100.0)
MONO ABS: 1 10*3/uL (ref 0.1–1.0)
Monocytes Relative: 8 %
Neutro Abs: 9.5 10*3/uL — ABNORMAL HIGH (ref 1.7–7.7)
Neutrophils Relative %: 79 %
PLATELETS: 248 10*3/uL (ref 150–400)
RBC: 4.35 MIL/uL (ref 4.22–5.81)
RDW: 13 % (ref 11.5–15.5)
WBC: 12 10*3/uL — AB (ref 4.0–10.5)

## 2016-06-13 LAB — BRAIN NATRIURETIC PEPTIDE: B NATRIURETIC PEPTIDE 5: 1014.6 pg/mL — AB (ref 0.0–100.0)

## 2016-06-13 LAB — BLOOD GAS, ARTERIAL
ACID-BASE DEFICIT: 3.7 mmol/L — AB (ref 0.0–2.0)
BICARBONATE: 19.9 mmol/L — AB (ref 20.0–28.0)
DELIVERY SYSTEMS: POSITIVE
Drawn by: 11249
EXPIRATORY PAP: 6
FIO2: 60
INSPIRATORY PAP: 14
Mode: POSITIVE
O2 SAT: 94.6 %
PATIENT TEMPERATURE: 98.7
PCO2 ART: 33.2 mmHg (ref 32.0–48.0)
PH ART: 7.394 (ref 7.350–7.450)
PO2 ART: 79.8 mmHg — AB (ref 83.0–108.0)
RATE: 12 resp/min

## 2016-06-13 LAB — COMPREHENSIVE METABOLIC PANEL
ALT: 20 U/L (ref 17–63)
ANION GAP: 7 (ref 5–15)
AST: 29 U/L (ref 15–41)
Albumin: 4 g/dL (ref 3.5–5.0)
Alkaline Phosphatase: 85 U/L (ref 38–126)
BUN: 15 mg/dL (ref 6–20)
CHLORIDE: 112 mmol/L — AB (ref 101–111)
CO2: 21 mmol/L — AB (ref 22–32)
CREATININE: 1.03 mg/dL (ref 0.61–1.24)
Calcium: 9 mg/dL (ref 8.9–10.3)
Glucose, Bld: 108 mg/dL — ABNORMAL HIGH (ref 65–99)
Potassium: 3.6 mmol/L (ref 3.5–5.1)
Sodium: 140 mmol/L (ref 135–145)
Total Bilirubin: 0.7 mg/dL (ref 0.3–1.2)
Total Protein: 7.4 g/dL (ref 6.5–8.1)

## 2016-06-13 LAB — EXPECTORATED SPUTUM ASSESSMENT W REFEX TO RESP CULTURE

## 2016-06-13 LAB — MRSA PCR SCREENING: MRSA by PCR: NEGATIVE

## 2016-06-13 LAB — I-STAT TROPONIN, ED: Troponin i, poc: 0.01 ng/mL (ref 0.00–0.08)

## 2016-06-13 LAB — EXPECTORATED SPUTUM ASSESSMENT W GRAM STAIN, RFLX TO RESP C: Special Requests: NORMAL

## 2016-06-13 LAB — GLUCOSE, CAPILLARY: Glucose-Capillary: 100 mg/dL — ABNORMAL HIGH (ref 65–99)

## 2016-06-13 LAB — I-STAT CG4 LACTIC ACID, ED: LACTIC ACID, VENOUS: 2.23 mmol/L — AB (ref 0.5–1.9)

## 2016-06-13 LAB — STREP PNEUMONIAE URINARY ANTIGEN: Strep Pneumo Urinary Antigen: NEGATIVE

## 2016-06-13 MED ORDER — METHYLPREDNISOLONE SODIUM SUCC 125 MG IJ SOLR
125.0000 mg | Freq: Once | INTRAMUSCULAR | Status: AC
  Administered 2016-06-13: 125 mg via INTRAVENOUS
  Filled 2016-06-13: qty 2

## 2016-06-13 MED ORDER — HEPARIN SODIUM (PORCINE) 5000 UNIT/ML IJ SOLN
5000.0000 [IU] | Freq: Three times a day (TID) | INTRAMUSCULAR | Status: DC
  Administered 2016-06-13 – 2016-06-16 (×9): 5000 [IU] via SUBCUTANEOUS
  Filled 2016-06-13 (×9): qty 1

## 2016-06-13 MED ORDER — FUROSEMIDE 40 MG PO TABS
40.0000 mg | ORAL_TABLET | Freq: Every day | ORAL | Status: DC
  Administered 2016-06-13 – 2016-06-16 (×4): 40 mg via ORAL
  Filled 2016-06-13 (×4): qty 1

## 2016-06-13 MED ORDER — PHENYTOIN SODIUM EXTENDED 100 MG PO CAPS
200.0000 mg | ORAL_CAPSULE | Freq: Two times a day (BID) | ORAL | Status: DC
  Administered 2016-06-13 – 2016-06-16 (×7): 200 mg via ORAL
  Filled 2016-06-13 (×7): qty 2

## 2016-06-13 MED ORDER — DEXTROSE 5 % IV SOLN
1.0000 g | Freq: Two times a day (BID) | INTRAVENOUS | Status: DC
  Administered 2016-06-13 – 2016-06-15 (×6): 1 g via INTRAVENOUS
  Filled 2016-06-13 (×7): qty 1

## 2016-06-13 MED ORDER — NITROGLYCERIN 0.4 MG SL SUBL: 0.4000 mg | SUBLINGUAL_TABLET | SUBLINGUAL | Status: DC | PRN

## 2016-06-13 MED ORDER — FUROSEMIDE 10 MG/ML IJ SOLN
60.0000 mg | Freq: Once | INTRAMUSCULAR | Status: AC
  Administered 2016-06-13: 60 mg via INTRAVENOUS
  Filled 2016-06-13: qty 6

## 2016-06-13 MED ORDER — MIRABEGRON ER 50 MG PO TB24
50.0000 mg | ORAL_TABLET | Freq: Every day | ORAL | Status: DC
  Administered 2016-06-13 – 2016-06-16 (×4): 50 mg via ORAL
  Filled 2016-06-13 (×4): qty 1

## 2016-06-13 MED ORDER — ASPIRIN 81 MG PO CHEW
81.0000 mg | CHEWABLE_TABLET | Freq: Every day | ORAL | Status: DC
  Administered 2016-06-13 – 2016-06-16 (×4): 81 mg via ORAL
  Filled 2016-06-13 (×4): qty 1

## 2016-06-13 MED ORDER — SPIRONOLACTONE 25 MG PO TABS
25.0000 mg | ORAL_TABLET | Freq: Every morning | ORAL | Status: DC
  Administered 2016-06-13 – 2016-06-16 (×4): 25 mg via ORAL
  Filled 2016-06-13 (×4): qty 1

## 2016-06-13 MED ORDER — DONEPEZIL HCL 10 MG PO TABS
10.0000 mg | ORAL_TABLET | Freq: Every day | ORAL | Status: DC
  Administered 2016-06-13 – 2016-06-15 (×3): 10 mg via ORAL
  Filled 2016-06-13 (×3): qty 1

## 2016-06-13 MED ORDER — ALBUTEROL SULFATE (2.5 MG/3ML) 0.083% IN NEBU
5.0000 mg | INHALATION_SOLUTION | Freq: Once | RESPIRATORY_TRACT | Status: AC
  Administered 2016-06-13: 5 mg via RESPIRATORY_TRACT
  Filled 2016-06-13: qty 6

## 2016-06-13 MED ORDER — VANCOMYCIN HCL 500 MG IV SOLR
500.0000 mg | Freq: Two times a day (BID) | INTRAVENOUS | Status: DC
  Administered 2016-06-13 – 2016-06-14 (×2): 500 mg via INTRAVENOUS
  Filled 2016-06-13 (×3): qty 500

## 2016-06-13 MED ORDER — LOSARTAN POTASSIUM 50 MG PO TABS
25.0000 mg | ORAL_TABLET | Freq: Every day | ORAL | Status: DC
  Administered 2016-06-13 – 2016-06-16 (×4): 25 mg via ORAL
  Filled 2016-06-13 (×4): qty 1

## 2016-06-13 MED ORDER — NITROGLYCERIN 0.3 MG SL SUBL: 0.3000 mg | SUBLINGUAL_TABLET | SUBLINGUAL | Status: DC | PRN

## 2016-06-13 MED ORDER — VANCOMYCIN HCL IN DEXTROSE 1-5 GM/200ML-% IV SOLN
1000.0000 mg | Freq: Once | INTRAVENOUS | Status: AC
  Administered 2016-06-13: 1000 mg via INTRAVENOUS
  Filled 2016-06-13: qty 200

## 2016-06-13 MED ORDER — CARVEDILOL 6.25 MG PO TABS
6.2500 mg | ORAL_TABLET | Freq: Two times a day (BID) | ORAL | Status: DC
  Administered 2016-06-13 – 2016-06-16 (×6): 6.25 mg via ORAL
  Filled 2016-06-13 (×6): qty 1

## 2016-06-13 MED ORDER — DEXTROMETHORPHAN POLISTIREX ER 30 MG/5ML PO SUER
10.0000 mL | Freq: Every day | ORAL | Status: DC | PRN
  Administered 2016-06-15: 60 mg via ORAL
  Filled 2016-06-13 (×2): qty 10

## 2016-06-13 MED ORDER — IPRATROPIUM BROMIDE 0.02 % IN SOLN
0.5000 mg | Freq: Once | RESPIRATORY_TRACT | Status: AC
  Administered 2016-06-13: 0.5 mg via RESPIRATORY_TRACT
  Filled 2016-06-13: qty 2.5

## 2016-06-13 MED ORDER — FLUTICASONE FUROATE-VILANTEROL 100-25 MCG/INH IN AEPB
1.0000 | INHALATION_SPRAY | Freq: Every day | RESPIRATORY_TRACT | Status: DC
  Administered 2016-06-15 – 2016-06-16 (×2): 1 via RESPIRATORY_TRACT
  Filled 2016-06-13 (×2): qty 28

## 2016-06-13 MED ORDER — BISMUTH SUBSALICYLATE 262 MG/15ML PO SUSP
30.0000 mL | ORAL | Status: DC | PRN
Start: 2016-06-13 — End: 2016-06-16
  Administered 2016-06-15: 30 mL via ORAL
  Filled 2016-06-13: qty 118
  Filled 2016-06-13: qty 236

## 2016-06-13 MED ORDER — ATORVASTATIN CALCIUM 10 MG PO TABS
10.0000 mg | ORAL_TABLET | Freq: Every day | ORAL | Status: DC
  Administered 2016-06-13 – 2016-06-16 (×4): 10 mg via ORAL
  Filled 2016-06-13 (×4): qty 1

## 2016-06-13 MED ORDER — VITAMIN D3 25 MCG (1000 UNIT) PO TABS
1000.0000 [IU] | ORAL_TABLET | Freq: Every day | ORAL | Status: DC
  Administered 2016-06-13 – 2016-06-16 (×4): 1000 [IU] via ORAL
  Filled 2016-06-13 (×4): qty 1

## 2016-06-13 NOTE — Progress Notes (Signed)
Patient transported to ICU without complication. Patient on 8L [place] with sats of 93%. Patient states his breathing is better. BiPAP at bedside if needed. RT will continue to monitor patient.

## 2016-06-13 NOTE — Progress Notes (Signed)
Pharmacy Antibiotic Note  [name] is a 83 y.o. male admitted on 06/13/2016 with pneumonia.  Pharmacy has been consulted for Vancomycin, cefepime dosing.  Plan: Vancomycin 1gm iv x1, then Vancomycin 500mg  IV every 12 hours.  Goal trough 15-20 mcg/mL.  Cefepime 1gm iv q12hr  Height: 5\' 10"  (177.8 cm) Weight: 150 lb (68 kg) IBW/kg (Calculated) : 73  Temp (24hrs), Avg:98.7 F (37.1 C), Min:98.7 F (37.1 C), Max:98.7 F (37.1 C)   Recent Labs Lab 06/13/16 0519 06/13/16 0538 06/13/16 0545  WBC  --  12.0*  --   CREATININE 1.03  --   --   LATICACIDVEN  --   --  2.23*    Estimated Creatinine Clearance: 54.1 mL/min (by C-G formula based on SCr of 1.03 mg/dL).    Allergies  Allergen Reactions  . Red Dye Other (See Comments)    Per Mar.  . Iodinated Diagnostic Agents Rash    Pt given 50 mg Benadryl prior to Omnipaque injection, tolerated well.    Antimicrobials this admission: Vancomycin 06/13/2016 >> Cefepime 06/13/2016 >>   Dose adjustments this admission: -  Microbiology results: pending  Thank you for allowing pharmacy to be a part of this patient's care.  Nani Skillern Crowford 06/13/2016 6:05 AM

## 2016-06-13 NOTE — ED Triage Notes (Signed)
Per EMS pt from [place] at [facility] for evaluation of shortness of breath that started at [place] pt labored with breathing and had O2 saturation of 78% on room air. EMS started  Pt on CPAP and saturation increased to 94%. HX of COPD not on oxygen at residence. Albuterol 5mg  given during transport.

## 2016-06-13 NOTE — H&P (Signed)
TRH H&P   Patient Demographics:    [name], is a 83 y.o. male  MRN: [id number]   DOB - 1932/10/14  Admit Date - 06/13/2016  Outpatient Primary MD for the patient is Allen Ringer, MD  Referring MD/NP/PA: PA Hannah  Patient coming from: SNF  Chief Complaint  Patient presents with  . Shortness of Breath      HPI:    [name]  is a 83 y.o. male, with a hx of COPD, coronary artery disease, chronic combined CHF, dementia, hypertension, seizure disorder, urethral stricture with chronic indwelling Foley catheter, and blindness , who was recently discharged in February with acute CHF(known EF 15-20%), patient presents with progressive dyspnea, found to be hypoxic by EMS at SNF of 79%, requiring BiPAP in ED, patient denies any fever, chills, chest pain, coffee-ground emesis, nausea or vomiting but reports cough, productive with brown phlegm, and exertional dyspnea, denies any orthopnea, chest x-ray significant for pneumonia, no JVD or lower extremity edema, Patient  was started on IV vancomycin and cefepime for HCAP treatment, I was called to admit    Review of systems:    In addition to the HPI above,  No Fever-chills, No Headache, No changes with  hearing,Patient is blind No problems swallowing food or Liquids, No Chest pain, and planes of productive cough with yellow phlegm, reports exertional dyspnea No Abdominal pain, No Nausea or Vommitting, Bowel movements are regular, No Blood in stool or Urine, No dysuria, No new skin rashes or bruises, No new joints pains-aches,  No new weakness, tingling, numbness in any extremity, No recent weight gain or loss, No polyuria, polydypsia or polyphagia, No significant Mental Stressors.  A full 10 point Review of Systems was done, except as stated above, all other Review of Systems were negative.   With Past History of the  following :    Past Medical History:  Diagnosis Date  . Acute MI Dec 2010   cardiac catheriziation and stenting of the LAD (bare metal) 03/14/09              . Anemia   . Angina   . Blood transfusion   . CAD (coronary artery disease)   . COPD (chronic obstructive pulmonary disease) ([place])   . DEMENTIA   . High cholesterol   . Hypertension   . LV dysfunction    EF 25 to 30% per echo May 2012  . NSTEMI (non-ST elevated myocardial infarction) [facility] [place]) Nov 2011   with LV dysfunction  . Prostatic hypertrophy   . Seizures ([place]) ~ 2006   "when blood pressure went up too high"; on Dilantin  . Urethral stricture    with subsequent trauma and has an indwelling Foley catheter in place during his first MI                                                 .  Urinary catheter in place    chronic  . Vision impairment    "legally blind"      Past Surgical History:  Procedure Laterality Date  . CARDIAC CATHETERIZATION  02/2010  . 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 CHOLECYSTECTOMY  05/30/11  . CHOLECYSTECTOMY  05/30/2011   Procedure: LAPAROSCOPIC CHOLECYSTECTOMY;  Surgeon: Judieth Keens, DO;  Location: [facility];  Service: General;  Laterality: N/A;  . CORONARY ANGIOPLASTY WITH STENT PLACEMENT  03/2009   "1"; stent to LAD  . TONSILLECTOMY     "when I was small"      Social History:     Social History  Substance Use Topics  . Smoking status: Former Smoker    Packs/day: 0.00    Years: 50.00    Types: Cigarettes    Quit date: 08/22/2004  . Smokeless tobacco: Never Used  . Alcohol use No     Comment: "quit drinking 1980's"     Lives - At SNF  Mobility - with assistance    Family History :     Family History  Problem Relation Age of Onset  . Colon cancer Neg Hx       Home Medications:   Prior to Admission medications   Medication Sig Start Date End Date Taking? Authorizing Provider  acetaminophen (TYLENOL) 500 MG tablet Take  1,000 mg by mouth every 8 (eight) hours as needed for moderate pain.   Yes Historical Provider, MD  aspirin 81 MG chewable tablet Chew 81 mg by mouth daily.   Yes Historical Provider, MD  atorvastatin (LIPITOR) 10 MG tablet Take 10 mg by mouth daily.   Yes Historical Provider, MD  benzocaine (ORAJEL MOUTH-AID) 20 % GEL Use as directed 1 application in the mouth or throat 4 (four) times daily as needed (gum pain).   Yes Historical Provider, MD  bismuth subsalicylate (PEPTO BISMOL) 262 MG/15ML suspension Take 30 mLs by mouth as needed for diarrhea or loose stools.    Yes Historical Provider, MD  carvedilol (COREG) 6.25 MG tablet Take 6.25 mg by mouth 2 (two) times daily with a meal.   Yes Historical Provider, MD  cholecalciferol (VITAMIN D) 1000 UNITS tablet Take 1,000 Units by mouth daily.   Yes Historical Provider, MD  dextromethorphan (DELSYM) 30 MG/5ML liquid Take 10 mLs by mouth daily as needed for cough.    Yes Historical Provider, MD  donepezil (ARICEPT) 10 MG tablet Take 10 mg by mouth at bedtime.   Yes Historical Provider, MD  Fluticasone Furoate-Vilanterol 100-25 MCG/INH AEPB Inhale 1 puff into the lungs daily with breakfast.   Yes Historical Provider, MD  furosemide (LASIX) 40 MG tablet Take 1 tablet (40 mg total) by mouth daily. 12/06/15  Yes Albertine Patricia, MD  hydrocortisone cream (PREPARATION H HYDROCORTISONE) 1 % Apply 1 application topically 2 (two) times daily as needed. For hemmoroids.   Yes Historical Provider, MD  loperamide (IMODIUM) 2 MG capsule Take 1 capsule (2 mg total) by mouth 4 (four) times daily as needed for diarrhea or loose stools. 02/28/16  Yes Orpah Greek, MD  losartan (COZAAR) 25 MG tablet Take 25 mg by mouth daily.   Yes Historical Provider, MD  mirabegron ER (MYRBETRIQ) 50 MG TB24 tablet Take 50 mg by mouth daily with breakfast.   Yes Historical Provider, MD  nitroGLYCERIN (NITROSTAT) 0.3 MG SL tablet Place 0.3 mg under the tongue every 5 (five) minutes  as needed for chest pain.   Yes Historical Provider, MD  phenytoin (DILANTIN) 100 MG ER capsule Take 200 mg by mouth 2 (two) times daily.   Yes Historical Provider, MD  spironolactone (ALDACTONE) 25 MG tablet Take 25 mg by mouth every morning.   Yes Historical Provider, MD     Allergies:     Allergies  Allergen Reactions  . Red Dye Other (See Comments)    Per Mar.  . Iodinated Diagnostic Agents Rash    Pt given 50 mg Benadryl prior to Omnipaque injection, tolerated well.     Physical Exam:   Vitals  Blood pressure 145/90, pulse 82, temperature 98.7 F (37.1 C), temperature source Rectal, resp. rate 20, height 5\' 10"  (1.778 m), weight 68 kg (150 lb), SpO2 100 %.   1. General frail elderly male  lying in bed in NAD, chronically ill-appearing  2. Normal affect and insight, Not Suicidal or Homicidal, Awake Alert, Oriented X 3.  3. No F.N deficits, ALL C.Nerves Intact, Strength 5/5 all 4 extremities, Sensation intact all 4 extremities, Plantars down going.  4. Ears and Eyes appear Normal, Conjunctivae clear. Moist Oral Mucosa.  5. Supple Neck, No JVD, No cervical lymphadenopathy appriciated, No Carotid Bruits.  6. Symmetrical Chest wall movement, Good air movement bilaterally, scattered rales, but no wheezing  7. RRR, No Gallops, Rubs , No Parasternal Heave.  8. Positive Bowel Sounds, Abdomen Soft, No tenderness, No organomegaly appriciated,No rebound -guarding or rigidity.  9.  No Cyanosis, Normal Skin Turgor, No Skin Rash or Bruise.  10. Good muscle tone,  joints appear normal , no effusions, Normal ROM.  11. No Palpable Lymph Nodes in Neck    Data Review:    CBC  Recent Labs Lab 06/13/16 0538  WBC 12.0*  HGB 12.9*  HCT 39.2  PLT 248  MCV 90.1  MCH 29.7  MCHC 32.9  RDW 13.0  LYMPHSABS 0.9  MONOABS 1.0  EOSABS 0.5  BASOSABS 0.1    ------------------------------------------------------------------------------------------------------------------  Chemistries   Recent Labs Lab 06/13/16 0519  NA 140  K 3.6  CL 112*  CO2 21*  GLUCOSE 108*  BUN 15  CREATININE 1.03  CALCIUM 9.0  AST 29  ALT 20  ALKPHOS 85  BILITOT 0.7   ------------------------------------------------------------------------------------------------------------------ estimated creatinine clearance is 54.1 mL/min (by C-G formula based on SCr of 1.03 mg/dL). ------------------------------------------------------------------------------------------------------------------ No results for input(s): TSH, T4TOTAL, T3FREE, THYROIDAB in the last 72 hours.  Invalid input(s): FREET3  Coagulation profile No results for input(s): INR, PROTIME in the last 168 hours. ------------------------------------------------------------------------------------------------------------------- No results for input(s): DDIMER in the last 72 hours. -------------------------------------------------------------------------------------------------------------------  Cardiac Enzymes No results for input(s): CKMB, TROPONINI, MYOGLOBIN in the last 168 hours.  Invalid input(s): CK ------------------------------------------------------------------------------------------------------------------    Component Value Date/Time   BNP 1,014.6 (H) 06/13/2016 0538     ---------------------------------------------------------------------------------------------------------------  Urinalysis    Component Value Date/Time   COLORURINE YELLOW 02/27/2016 0015   APPEARANCEUR CLEAR 02/27/2016 0015   LABSPEC 1.009 02/27/2016 0015   PHURINE 5.5 02/27/2016 0015   GLUCOSEU NEGATIVE 02/27/2016 0015   HGBUR SMALL (A) 02/27/2016 0015   BILIRUBINUR NEGATIVE 02/27/2016 0015   KETONESUR NEGATIVE 02/27/2016 0015   PROTEINUR NEGATIVE 02/27/2016 0015   UROBILINOGEN 0.2 08/08/2014 1109    NITRITE NEGATIVE 02/27/2016 0015   LEUKOCYTESUR MODERATE (A) 02/27/2016 0015    ----------------------------------------------------------------------------------------------------------------   Imaging Results:    Dg Chest Port 1 View  Result Date: 06/13/2016 CLINICAL DATA:  Increasing shortness of breath, hypoxia beginning  at midnight. History of COPD. EXAM: PORTABLE CHEST 1 VIEW COMPARISON:  Chest radiograph May 17, 2016 FINDINGS: Cardiac silhouette is moderately enlarged. Tortuous calcified aorta. Diffuse interstitial prominence, patchy bibasilar and RIGHT mid lung zone airspace opacities and small pleural effusions. No pneumothorax. Soft tissue planes and included osseous structures are nonsuspicious. IMPRESSION: Severe interstitial prominence concerning for atypical infection, with bibasilar and RIGHT mid lung zone atelectasis versus focal consolidation/pneumonia. Small pleural effusions. Stable cardiomegaly and COPD. Electronically Signed   By: Elon Alas M.D.   On: 06/13/2016 05:18    My personal review of EKG: Rhythm NSR, Rate  100 /min, QTc 442 , old incomplete LBBB.   Assessment & Plan:    Active Problems:   Hypertension   Seizure disorder ([place])   CAD (coronary artery disease)   Dementia   Chronic CHF (HCC)  Acute hypoxic respiratory failure - Presents with hypoxia, requiring BiPAP , currently improving, probably can't take him off BiPAP soon this is most likely related to HCAP, with known history of combined systolic/diastolic CHF with EF 71-24%, but no convincing evidence of volume overload, will give one dose of IV diuresis, and repeat chest x-ray in a.m.  HCAP - Even though a febrile, has mild leukocytosis, reports cough with a productive sputum, chest x-ray suggestive of pneumonia, admitted on the pneumonia pathway, follow on blood cultures, sputum cultures, continue with IV vancomycin and cefepime. - Please see above discussion regarding repeating chest x-ray  in a.m.  Chronic combined systolic and diastolic CHF - Most recent echo with EF 15-20%, even significant elevated BNP, a slower than his baseline, no JVD, no LAD crackers, no lower extremity edema, appears to be euvolemic, will resume on home dose Lasix 40 mg oral once a day, will give only one dose of 60 mg IV Lasix once today. - Continue with Coreg, Aldactone and losartan  Hypertension - Continue with home medication Coreg, Aldactone and losartan  CAD  - History of stent to LAD - Continued ASA 81, Lipitor, Coreg and  losartan  - Denies chest pain, stable   Dementia - Appears to be stable on admission; pt calm, cooperative, provides history  - Plan to continue Aricept   Seizure disorder  - No recent seizures  - Continue Dilantin    DVT Prophylaxis Heparin -   SCDs   AM Labs Ordered, also please review Full Orders  Family Communication: Admission, patients condition and plan of care including tests being ordered have been discussed with the patient  who indicate understanding and agree with the plan and Code Status.  Code Status currently full code, has been DO NOT RESUSCITATE in the past  Likely DC to  Back to SNF  Condition GUARDED    Consults called: none  Admission status: inpatient  Time spent in minutes : 65 minutes   Jacklin Zwick M.D on 06/13/2016 at 7:24 AM  Between 7am to 7pm - Pager - [phone]. After 7pm go to [email or web address] - password [email or web address]  [name] - Office  [phone]

## 2016-06-13 NOTE — Care Management Note (Signed)
Case Management Note  Patient Details  Name: [name] MRN: [id number] Date of Birth: July 09, 1932  Subjective/Objective:     resp failure requiring bipap and hfnc.               Action/Plan:froDate:  June 13, 2016 Chart reviewed for concurrent status and case management needs. Will continue to follow patient progress. Discharge Planning: following for needs Expected discharge date: [id number] Velva Harman, BSN, Luis Llorons Torres, CCM   [phone] AFL    Expected Discharge Date:   (UNKNOWN)               Expected Discharge Plan:  Assisted Living / Rest Home  In-House Referral:  Clinical Social Work  Discharge planning Services     Post Acute Care Choice:    Choice offered to:     DME Arranged:    DME Agency:     HH Arranged:    [place] Agency:     Status of Service:  In process, will continue to follow  If discussed at Long Length of Stay Meetings, dates discussed:    Additional Comments:  Leeroy Cha, RN 06/13/2016, 9:51 AM

## 2016-06-13 NOTE — ED Provider Notes (Signed)
[facility] DEPT Provider Note   CSN: [id number] Arrival date & time: 06/13/16  0429     History   Chief Complaint Chief Complaint  Patient presents with  . Shortness of Breath    HPI Allen [name] is a 83 y.o. male with a hx of [name], Allen [name], Allen [name], Allen [name], Allen [name], Allen [name], Allen [name], Allen [name] presents to the Emergency Department complaining of gradual, persistent, progressively worsening Shortness of breath onset just after midnight. Per EMS, patient was found with oxygen saturation of 79% upon fire department arrival. This improved to 89% with nonrebreather. Patient was given albuterol 5 mg Allen enoute. No additional treatments. He denies known fever. He denies chest pain, abdominal pain, nausea or vomiting. Exertion makes the symptoms worse. At this time nothing makes them better.  He is a DO NOT RESUSCITATE.  Review shows that patient was admitted in mid February for [name] exacerbation.  That time his EF was 15-20%. Cardiology was consult Allen Allen patient was diuresed with IV Lasix.   The history is provided by the patient, medical records Allen the EMS personnel. No language interpreter was used.    Past Medical History:  Diagnosis Date  . Acute MI Dec 2010   cardiac catheriziation Allen stenting of the LAD (bare metal) 03/14/09              . Anemia   . Angina   . Blood transfusion   . CAD (Allen [name])   . [name] (Allen obstructive pulmonary [name]) ([place])   . Allen [name]   . High cholesterol   . Allen [name]   . LV dysfunction    EF 25 to 30% per echo May 2012  . NSTEMI (non-ST elevated myocardial infarction) [facility]) Nov 2011   with LV dysfunction  . Prostatic hypertrophy   . Seizures ([place]) ~ 2006   "when blood pressure went up too high"; on Dilantin  . Allen stricture    with subsequent trauma Allen has an indwelling Foley [name] in place during his first  MI                                                 . Urinary [name] in place    Allen  . Vision impairment    "legally blind"    Patient Active Problem List   Diagnosis Date Noted  . Bilateral leg edema   . Hypervolemia   . Allen [name] 05/18/2016  . Hypoxia 12/03/2015  . Acute on Allen combined systolic Allen diastolic [name] (congestive heart failure) ([place]) 12/03/2015  . Rectal bleeding 07/01/2015  . UTI (lower urinary tract infection) 03/07/2012  . Lower GI bleed = suspect diverticular 03/06/2012  . Chest pain, unspecified 05/11/2011  . Preop cardiovascular exam 12/07/2010  . CAD (Allen [name]) 08/24/2010  . Allen [name]   . Allen [name] ([place])   . Anemia   . LV dysfunction   . NSTEMI (non-ST elevated myocardial infarction) [facility])     Past Surgical History:  Procedure Laterality Date  . CARDIAC CATHETERIZATION  02/2010  . 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 CHOLECYSTECTOMY  05/30/11  . CHOLECYSTECTOMY  05/30/2011   Procedure: LAPAROSCOPIC CHOLECYSTECTOMY;  Surgeon: Judieth Keens, DO;  Location: [facility];  Service:  General;  Laterality: N/A;  . Allen ANGIOPLASTY WITH STENT PLACEMENT  03/2009   "1"; stent to LAD  . TONSILLECTOMY     "when I was small"       Home Medications    Prior to Admission medications   Medication Sig Start Date End Date Taking? Authorizing Provider  acetaminophen (TYLENOL) 500 MG tablet Take 1,000 mg by mouth every 8 (eight) hours as needed for moderate pain.   Yes Historical Provider, MD  aspirin 81 MG chewable tablet Chew 81 mg by mouth daily.   Yes Historical Provider, MD  atorvastatin (LIPITOR) 10 MG tablet Take 10 mg by mouth daily.   Yes Historical Provider, MD  benzocaine (ORAJEL MOUTH-AID) 20 % GEL Use as directed 1 application in the mouth or throat 4 (four) times daily as needed (gum pain).   Yes Historical Provider, MD  bismuth subsalicylate (PEPTO BISMOL) 262 MG/15ML  suspension Take 30 mLs by mouth as needed for diarrhea or loose stools.    Yes Historical Provider, MD  carvedilol (COREG) 6.25 MG tablet Take 6.25 mg by mouth 2 (two) times daily with a meal.   Yes Historical Provider, MD  cholecalciferol (VITAMIN D) 1000 UNITS tablet Take 1,000 Units by mouth daily.   Yes Historical Provider, MD  dextromethorphan (DELSYM) 30 MG/5ML liquid Take 10 mLs by mouth daily as needed for cough.    Yes Historical Provider, MD  donepezil (ARICEPT) 10 MG tablet Take 10 mg by mouth at bedtime.   Yes Historical Provider, MD  Fluticasone Furoate-Vilanterol 100-25 MCG/INH AEPB Inhale 1 puff into the lungs daily with breakfast.   Yes Historical Provider, MD  furosemide (LASIX) 40 MG tablet Take 1 tablet (40 mg total) by mouth daily. 12/06/15  Yes Albertine Patricia, MD  hydrocortisone cream (PREPARATION H HYDROCORTISONE) 1 % Apply 1 application topically 2 (two) times daily as needed. For hemmoroids.   Yes Historical Provider, MD  loperamide (IMODIUM) 2 MG capsule Take 1 capsule (2 mg total) by mouth 4 (four) times daily as needed for diarrhea or loose stools. 02/28/16  Yes Orpah Greek, MD  losartan (COZAAR) 25 MG tablet Take 25 mg by mouth daily.   Yes Historical Provider, MD  mirabegron ER (MYRBETRIQ) 50 MG TB24 tablet Take 50 mg by mouth daily with breakfast.   Yes Historical Provider, MD  nitroGLYCERIN (NITROSTAT) 0.3 MG SL tablet Place 0.3 mg under the tongue every 5 (five) minutes as needed for chest pain.   Yes Historical Provider, MD  phenytoin (DILANTIN) 100 MG ER capsule Take 200 mg by mouth 2 (two) times daily.   Yes Historical Provider, MD  spironolactone (ALDACTONE) 25 MG tablet Take 25 mg by mouth every morning.   Yes Historical Provider, MD    Family History Family History  Problem Relation Age of Onset  . Colon cancer Neg Hx     Social History Social History  Substance Use Topics  . Smoking status: Former Smoker    Packs/day: 0.00    Years: 50.00      Types: Cigarettes    Quit date: 08/22/2004  . Smokeless tobacco: Never Used  . Alcohol use No     Comment: "quit drinking 1980's"     Allergies   Red dye Allen Iodinated diagnostic agents   Review of Systems Review of Systems  Unable to perform ROS: Acuity of condition  Respiratory: Positive for shortness of breath.   Cardiovascular: Negative for chest pain Allen leg swelling.  Gastrointestinal: Negative  for abdominal pain, nausea Allen vomiting.     Physical Exam Updated Vital Signs BP (!) 150/114 (BP Location: Right Arm)   Pulse 101   Temp 98.7 F (37.1 C) (Rectal)   Resp (!) 32   Ht 5\' 10"  (1.778 m)   Wt 68 kg   SpO2 (!) 77% Comment: room air  BMI 21.52 kg/m   Physical Exam  Constitutional: He appears well-developed Allen well-nourished. He appears distressed.  Awake, alert,   HENT:  Head: Normocephalic Allen atraumatic.  Mouth/Throat: Oropharynx is clear Allen moist. No oropharyngeal exudate.  Eyes: Conjunctivae are normal. No scleral icterus.  Neck: Normal range of motion. Neck supple.  Cardiovascular: Normal rate, regular rhythm Allen intact distal pulses.   Pulmonary/Chest: Accessory muscle usage present. Tachypnea noted. He is in respiratory distress. He has decreased breath sounds in the right upper field, the right middle field, the left upper field Allen the left middle field. He has no wheezes. He has rhonchi in the right lower field Allen the left lower field.  Equal chest expansion  Abdominal: Soft. Bowel sounds are normal. He exhibits no mass. There is no tenderness. There is no rebound Allen no guarding.  Musculoskeletal: Normal range of motion. He exhibits no edema.  Neurological: He is alert.  Speech is clear  Moves extremities without ataxia  Skin: Skin is warm Allen dry. He is not diaphoretic. There is pallor.  Psychiatric: He has a normal mood Allen affect.  Nursing note Allen vitals reviewed.    ED Treatments / Results  Labs (all labs ordered are listed, but  only abnormal results are displayed) Labs Reviewed  BLOOD GAS, ARTERIAL - Abnormal; Notable for the following:       Result Value   pO2, Arterial 79.8 (*)    Bicarbonate 19.9 (*)    Acid-base deficit 3.7 (*)    All other components within normal limits  COMPREHENSIVE METABOLIC PANEL - Abnormal; Notable for the following:    Chloride 112 (*)    CO2 21 (*)    Glucose, Bld 108 (*)    All other components within normal limits  CBC WITH DIFFERENTIAL/PLATELET - Abnormal; Notable for the following:    WBC 12.0 (*)    Hemoglobin 12.9 (*)    Neutro Abs 9.5 (*)    All other components within normal limits  I-STAT CG4 LACTIC ACID, ED - Abnormal; Notable for the following:    Lactic Acid, Venous 2.23 (*)    All other components within normal limits  CULTURE, BLOOD (ROUTINE X 2)  CULTURE, BLOOD (ROUTINE X 2)  CULTURE, EXPECTORATED SPUTUM-ASSESSMENT  GRAM STAIN  CBC WITH DIFFERENTIAL/PLATELET  BRAIN NATRIURETIC PEPTIDE  HIV ANTIBODY (ROUTINE TESTING)  STREP PNEUMONIAE URINARY ANTIGEN  I-STAT TROPOININ, ED    EKG  EKG Interpretation  Date/Time:  Tuesday June 13 2016 04:47:39 EDT Ventricular Rate:  100 PR Interval:    QRS Duration: 109 QT Interval:  342 QTC Calculation: 442 R Axis:   33 Text Interpretation:  Normal sinus rhythm Paired ventricular premature complexes Incomplete left bundle branch block Probable left ventricular hypertrophy Anterior Q waves, possibly due to LVH Confirmed by Dina Rich  MD, [place] ([id number]) on 06/13/2016 4:52:12 AM       Radiology Dg Chest Port 1 View  Result Date: 06/13/2016 CLINICAL DATA:  Increasing shortness of breath, hypoxia beginning at midnight. History of [name]. EXAM: PORTABLE CHEST 1 VIEW COMPARISON:  Chest radiograph May 17, 2016 FINDINGS: Cardiac silhouette is moderately enlarged. Tortuous calcified aorta.  Diffuse interstitial prominence, patchy bibasilar Allen RIGHT mid lung zone airspace opacities Allen small pleural effusions. No  pneumothorax. Soft tissue planes Allen included osseous structures are nonsuspicious. IMPRESSION: Severe interstitial prominence concerning for atypical infection, with bibasilar Allen RIGHT mid lung zone atelectasis versus focal consolidation/pneumonia. Small pleural effusions. Stable cardiomegaly Allen [name]. Electronically Signed   By: Elon Alas M.D.   On: 06/13/2016 05:18    Procedures Procedures (including critical care time)  CRITICAL CARE Performed by: Abigail Butts Total critical care time: 45 minutes Critical care time was exclusive of separately billable procedures Allen treating other patients. Critical care was necessary to treat or prevent imminent or life-threatening deterioration. Critical care was time spent personally by me on the following activities: development of treatment plan with patient Allen/or surrogate as well as nursing, discussions with consultants, evaluation of patient's response to treatment, examination of patient, obtaining history from patient or surrogate, ordering Allen performing treatments Allen interventions, ordering Allen review of laboratory studies, ordering Allen review of radiographic studies, pulse oximetry Allen re-evaluation of patient's condition.   Medications Ordered in ED Medications  ceFEPIme (MAXIPIME) 1 g in dextrose 5 % 50 mL IVPB (1 g Intravenous New Bag/Given 06/13/16 0617)  vancomycin (VANCOCIN) IVPB 1000 mg/200 mL premix (not administered)  vancomycin (VANCOCIN) 500 mg in sodium chloride 0.9 % 100 mL IVPB (not administered)  albuterol (PROVENTIL) (2.5 MG/3ML) 0.083% nebulizer solution 5 mg (5 mg Nebulization Given 06/13/16 0506)  ipratropium (ATROVENT) nebulizer solution 0.5 mg (0.5 mg Nebulization Given 06/13/16 0506)  methylPREDNISolone sodium succinate (SOLU-MEDROL) 125 mg/2 mL injection 125 mg (125 mg Intravenous Given 06/13/16 0538)     Initial Impression / Assessment Allen Plan / ED Course  I have reviewed the triage vital signs Allen  the nursing notes.  Pertinent labs & imaging results that were available during my care of the patient were reviewed by me Allen considered in my medical decision making (see chart for details).  Clinical Course as of Jun 13 640  Tue Jun 13, 2016  0639 Discussed with Dr. Hal Hope who will admit  [HM]    Clinical Course User Index [HM] Jarrett Soho Justen Fonda, PA-C    History presents with severe hypoxia Allen increased work of breathing. Concern for [name] versus [name] exacerbation. Patient was hospitalized mid November. He denies fever Home. He is afebrile here. Significantly hypoxic Allen with severe acid or distress on arrival. This improved with BiPAP. Patient given albuterol, Atrovent Allen steroids. Chest x-ray shows pneumonia at this time likely healthcare acquired. He remains alert Allen oriented. Blood pressure without hypoxia. Cardiac improved. Antibiotics Allen Cultures started. Will admit.  BP 139/97   Pulse 85   Temp 98.7 F (37.1 C) (Rectal)   Resp 23   Ht 5\' 10"  (1.778 m)   Wt 68 kg   SpO2 100%   BMI 21.52 kg/m   The patient was discussed with Allen seen by Dr. Dina Rich who agrees with the treatment plan.     Final Clinical Impressions(s) / ED Diagnoses   Final diagnoses:  Hypoxia  SOB (shortness of breath)  HCAP (healthcare-associated pneumonia)    New Prescriptions New Prescriptions   No medications on file     Abigail Butts, PA-C 06/13/16 [place], MD 06/19/16 [phone]

## 2016-06-13 NOTE — ED Notes (Signed)
Bed: RESA Expected date:  Expected time:  Means of arrival:  Comments: 83 YO M/ Shortness of breath

## 2016-06-13 NOTE — ED Notes (Signed)
EKG given to EDP, Horton,MD., for review. 

## 2016-06-13 NOTE — ED Notes (Signed)
Spoke with nurse at ICU, ready for patient at 0830.

## 2016-06-14 ENCOUNTER — Inpatient Hospital Stay (HOSPITAL_COMMUNITY): Payer: Medicare Other

## 2016-06-14 DIAGNOSIS — J9621 Acute and chronic respiratory failure with hypoxia: Secondary | ICD-10-CM

## 2016-06-14 LAB — HIV ANTIBODY (ROUTINE TESTING W REFLEX): HIV Screen 4th Generation wRfx: NONREACTIVE

## 2016-06-14 LAB — URINALYSIS, ROUTINE W REFLEX MICROSCOPIC
BILIRUBIN URINE: NEGATIVE
Glucose, UA: NEGATIVE mg/dL
KETONES UR: 5 mg/dL — AB
Nitrite: NEGATIVE
PROTEIN: NEGATIVE mg/dL
Specific Gravity, Urine: 1.021 (ref 1.005–1.030)
pH: 5 (ref 5.0–8.0)

## 2016-06-14 LAB — BLOOD CULTURE ID PANEL (REFLEXED)
Acinetobacter baumannii: NOT DETECTED
CANDIDA GLABRATA: NOT DETECTED
CANDIDA PARAPSILOSIS: NOT DETECTED
CANDIDA TROPICALIS: NOT DETECTED
Candida albicans: NOT DETECTED
Candida krusei: NOT DETECTED
ENTEROBACTER CLOACAE COMPLEX: NOT DETECTED
Enterobacteriaceae species: NOT DETECTED
Enterococcus species: NOT DETECTED
Escherichia coli: NOT DETECTED
Haemophilus influenzae: NOT DETECTED
KLEBSIELLA OXYTOCA: NOT DETECTED
Klebsiella pneumoniae: NOT DETECTED
Listeria monocytogenes: NOT DETECTED
NEISSERIA MENINGITIDIS: NOT DETECTED
PROTEUS SPECIES: NOT DETECTED
Pseudomonas aeruginosa: NOT DETECTED
STAPHYLOCOCCUS SPECIES: NOT DETECTED
STREPTOCOCCUS AGALACTIAE: NOT DETECTED
Serratia marcescens: NOT DETECTED
Staphylococcus aureus (BCID): NOT DETECTED
Streptococcus pneumoniae: NOT DETECTED
Streptococcus pyogenes: NOT DETECTED
Streptococcus species: NOT DETECTED

## 2016-06-14 LAB — BASIC METABOLIC PANEL
Anion gap: 6 (ref 5–15)
BUN: 15 mg/dL (ref 6–20)
CHLORIDE: 107 mmol/L (ref 101–111)
CO2: 24 mmol/L (ref 22–32)
CREATININE: 1.27 mg/dL — AB (ref 0.61–1.24)
Calcium: 8.5 mg/dL — ABNORMAL LOW (ref 8.9–10.3)
GFR calc Af Amer: 59 mL/min — ABNORMAL LOW (ref >60)
GFR, EST NON AFRICAN AMERICAN: 51 mL/min — AB (ref >60)
GLUCOSE: 83 mg/dL (ref 65–99)
Potassium: 3.7 mmol/L (ref 3.5–5.1)
SODIUM: 137 mmol/L (ref 135–145)

## 2016-06-14 LAB — CBC
HCT: 35.3 % — ABNORMAL LOW (ref 39.0–52.0)
Hemoglobin: 11.6 g/dL — ABNORMAL LOW (ref 13.0–17.0)
MCH: 29 pg (ref 26.0–34.0)
MCHC: 32.9 g/dL (ref 30.0–36.0)
MCV: 88.3 fL (ref 78.0–100.0)
PLATELETS: 211 10*3/uL (ref 150–400)
RBC: 4 MIL/uL — ABNORMAL LOW (ref 4.22–5.81)
RDW: 12.8 % (ref 11.5–15.5)
WBC: 7.3 10*3/uL (ref 4.0–10.5)

## 2016-06-14 LAB — LACTIC ACID, PLASMA
Lactic Acid, Venous: 1 mmol/L (ref 0.5–1.9)
Lactic Acid, Venous: 1.9 mmol/L (ref 0.5–1.9)

## 2016-06-14 MED ORDER — LIP MEDEX EX OINT
TOPICAL_OINTMENT | CUTANEOUS | Status: AC
  Administered 2016-06-14: 22:00:00
  Filled 2016-06-14: qty 7

## 2016-06-14 NOTE — Clinical Social Work Note (Signed)
Clinical Social Work Assessment  Patient Details  Name: [name] MRN: [id number] Date of Birth: 14-Sep-1932  Date of referral:  06/14/16               Reason for consult:  Discharge Planning                Permission sought to share information with:  Facility Art therapist granted to share information::  Yes, Verbal Permission Granted  Name::        Agency::     Relationship::     Contact Information:     Housing/Transportation Living arrangements for the past 2 months:  [place] of Information:  Patient, Facility, Other (Comment Required) (relative) Patient Interpreter Needed:  None Criminal Activity/Legal Involvement Pertinent to Current Situation/Hospitalization:  No - Comment as needed Significant Relationships:  Other Family Members Lives with:  Facility Resident Do you feel safe going back to the place where you live?  Yes Need for family participation in patient care:  Yes (Comment)  Care giving concerns:  No concerns reported at this time.   Social Worker assessment / plan:  Pt hospitalized on 06/13/16 from [facility] ALF with Acute hypoxic respiratory failure. CSW contacted to assist with d/c planning. Pt reports that he plans to return to ALF at d/c. Pt gave CSW permission to contact his family, Jorge Mandril [phone], to confirm dc plan. Family is in agreement with this plan. CSW has sent clinicals to ALF for review. CSW will continue to follow to assist with d/c planning.  Employment status:  Retired Nurse, adult PT Recommendations:  Not assessed at this time Information / Referral to community resources:     Patient/Family's Response to care: Pt / family would like pt to return to ALF at d/c.  Patient/Family's Understanding of and Emotional Response to Diagnosis, Current Treatment, and Prognosis:  " I'm starting to feel better. Much better than yesterday. " Pt is looking forward  to returning to ALF and appreciates CSW visit.  Emotional Assessment Appearance:  Appears stated age Attitude/Demeanor/Rapport:  Other (cooperative) Affect (typically observed):  Pleasant, Appropriate Orientation:  Oriented to Self, Oriented to Place, Oriented to  Time, Oriented to Situation Alcohol / Substance use:  Not Applicable Psych involvement (Current and /or in the community):  No (Comment)  Discharge Needs  Concerns to be addressed:  Discharge Planning Concerns Readmission within the last 30 days:  Yes Current discharge risk:  None Barriers to Discharge:  No Barriers Identified   Luretha Rued, [place] 06/14/2016, 2:58 PM

## 2016-06-14 NOTE — Progress Notes (Signed)
PHARMACY - PHYSICIAN COMMUNICATION CRITICAL VALUE ALERT - BLOOD CULTURE IDENTIFICATION (BCID)  Results for orders placed or performed during the hospital encounter of 06/13/16  Blood Culture ID Panel (Reflexed) (Collected: 06/13/2016  5:54 AM)  Result Value Ref Range   Enterococcus species NOT DETECTED NOT DETECTED   Listeria monocytogenes NOT DETECTED NOT DETECTED   Staphylococcus species NOT DETECTED NOT DETECTED   Staphylococcus aureus NOT DETECTED NOT DETECTED   Streptococcus species NOT DETECTED NOT DETECTED   Streptococcus agalactiae NOT DETECTED NOT DETECTED   Streptococcus pneumoniae NOT DETECTED NOT DETECTED   Streptococcus pyogenes NOT DETECTED NOT DETECTED   Acinetobacter baumannii NOT DETECTED NOT DETECTED   Enterobacteriaceae species NOT DETECTED NOT DETECTED   Enterobacter cloacae complex NOT DETECTED NOT DETECTED   Escherichia coli NOT DETECTED NOT DETECTED   Klebsiella oxytoca NOT DETECTED NOT DETECTED   Klebsiella pneumoniae NOT DETECTED NOT DETECTED   Proteus species NOT DETECTED NOT DETECTED   Serratia marcescens NOT DETECTED NOT DETECTED   Haemophilus influenzae NOT DETECTED NOT DETECTED   Neisseria meningitidis NOT DETECTED NOT DETECTED   Pseudomonas aeruginosa NOT DETECTED NOT DETECTED   Candida albicans NOT DETECTED NOT DETECTED   Candida glabrata NOT DETECTED NOT DETECTED   Candida krusei NOT DETECTED NOT DETECTED   Candida parapsilosis NOT DETECTED NOT DETECTED   Candida tropicalis NOT DETECTED NOT DETECTED    Name of physician (or Provider) Contacted: Dr. [facility]  Changes to prescribed antibiotics required: continue cefepime  Lynelle Doctor 06/14/2016  1:52 PM

## 2016-06-14 NOTE — Progress Notes (Addendum)
PROGRESS NOTE                                                                                                                                                                                                             Patient Demographics:    [name], is a 83 y.o. male, DOB - 04/27/1932, YIF:[id number]  Admit date - 06/13/2016   Admitting Physician Albertine Patricia, MD  Outpatient Primary MD for the patient is Tivis Ringer, MD  LOS - 2  Chief Complaint  Patient presents with  . Shortness of Breath       Brief Narrative   83 y.o. male, with a hx of COPD, coronary artery disease, chronic combined CHF, dementia, hypertension, seizure disorder, urethral stricture with chronic indwelling Foley catheter, and blindness, who was recently discharged in February with acute CHF(known EF 15-20%), patient presents with progressive dyspnea, Workup significant of pneumonia.   Subjective:    [name] today has, No headache, No chest pain, No abdominal pain - Reports some cough, less productive, reports he is feeling much better today, dyspnea has improved .   Assessment  & Plan :    Active Problems:   Hypertension   Seizure disorder ([place])   CAD (coronary artery disease)   Dementia   Chronic CHF ([place])   HCAP (healthcare-associated pneumonia)   Acute hypoxic respiratory failure - Secondary to pneumonia - Presents with hypoxia, requiring BiPAP initially, significant improved, the requirement of BiPAP overnight, and on 4 L nasal cannula, wean as tolerated.  HCAP - Recent hospitalization, initial x-ray with multifocal pneumonia, appears to be improving and repeat chest x-ray today . - Empirically on IV vancomycin and Zosyn, blood cultures with no growth to date, sputum culture with no growth as well, will discontinue vancomycin, and continue with IV cefepime . - Addendum: One set of blood culture growing gram-negative rods, patient is on  cefepime  Chronic combined systolic and diastolic CHF - Most recent echo with EF 15-20%, even significant elevated BNP, no JVD,  no lower extremity edema, appears to be euvolemic, continue with home dose Lasix and Aldactone . - Continue with Coreg.  Hypertension - Continue with home medication Coreg, Aldactone and losartan  CAD  - History of stent to LAD - ContinuedASA 81, Lipitor, Coreg and  losartan  - Denies chest pain, stable  Dementia - Appears to be stable on admission; pt calm, cooperative, provides history and appropriate, awake alert 3 - Plan to continue Aricept   Seizure disorder  - No recent seizures  - Continue Dilantin    Code Status : Full code, this was confirmed by the patient(even though he had DO NOT RESUSCITATE form previous admission, he rescinded it)  Family Communication  : None at bedside  Disposition Plan  : We'll transfer to [place] [place] floor, he did go back to SNF when medically stable in 2-3 days  Consults  :  None  Procedures  : None  DVT Prophylaxis  :  Heparin - SCDs   Lab Results  Component Value Date   PLT 211 06/14/2016    Antibiotics  :    Anti-infectives    Start     Dose/Rate Route Frequency Ordered Stop   06/13/16 2200  vancomycin (VANCOCIN) 500 mg in sodium chloride 0.9 % 100 mL IVPB     500 mg 100 mL/hr over 60 Minutes Intravenous Every 12 hours 06/13/16 0559     06/13/16 0600  ceFEPIme (MAXIPIME) 1 g in dextrose 5 % 50 mL IVPB     1 g 100 mL/hr over 30 Minutes Intravenous 2 times daily 06/13/16 0549 06/21/16 0759   06/13/16 0600  vancomycin (VANCOCIN) IVPB 1000 mg/200 mL premix     1,000 mg 200 mL/hr over 60 Minutes Intravenous  Once 06/13/16 0558 06/13/16 0808        Objective:   Vitals:   06/14/16 0600 06/14/16 0700 06/14/16 0800 06/14/16 1200  BP:   134/89 103/63  Pulse: 75  74 65  Resp: 17  19 18   Temp:  98.7 F (37.1 C)    TempSrc:  Oral    SpO2: 96%  97% 91%  Weight:      Height:        Wt  Readings from Last 3 Encounters:  06/13/16 68 kg (150 lb)  05/22/16 69 kg (152 lb 1.9 oz)  02/27/16 69.9 kg (154 lb)     Intake/Output Summary (Last 24 hours) at 06/14/16 1234 Last data filed at 06/14/16 1200  Gross per 24 hour  Intake             1110 ml  Output             3690 ml  Net            -2580 ml     Physical Exam  Awake Alert, Oriented X 3, No new F.N deficits, Normal affect Supple Neck,No JVD,  Symmetrical Chest wall movement, Good air movement bilaterally, CTAB RRR,No Gallops,Rubs or new Murmurs, No Parasternal Heave +ve B.Sounds, Abd Soft, No tenderness,  No rebound - guarding or rigidity. No Cyanosis, Clubbing or edema, No new Rash or bruise      Data Review:    CBC  Recent Labs Lab 06/13/16 0538 06/14/16 0710  WBC 12.0* 7.3  HGB 12.9* 11.6*  HCT 39.2 35.3*  PLT 248 211  MCV 90.1 88.3  MCH 29.7 29.0  MCHC 32.9 32.9  RDW 13.0 12.8  LYMPHSABS 0.9  --   MONOABS 1.0  --   EOSABS 0.5  --   BASOSABS 0.1  --     Chemistries   Recent Labs Lab 06/13/16 0519 06/14/16 0710  NA 140 137  K 3.6 3.7  CL 112* 107  CO2 21* 24  GLUCOSE 108* 83  BUN 15 15  CREATININE 1.03 1.27*  CALCIUM 9.0 8.5*  AST 29  --   ALT 20  --   ALKPHOS 85  --   BILITOT 0.7  --    ------------------------------------------------------------------------------------------------------------------ No results for input(s): CHOL, HDL, LDLCALC, TRIG, CHOLHDL, LDLDIRECT in the last 72 hours.  Lab Results  Component Value Date   HGBA1C 5.3 07/01/2015   ------------------------------------------------------------------------------------------------------------------ No results for input(s): TSH, T4TOTAL, T3FREE, THYROIDAB in the last 72 hours.  Invalid input(s): FREET3 ------------------------------------------------------------------------------------------------------------------ No results for input(s): VITAMINB12, FOLATE, FERRITIN, TIBC, IRON, RETICCTPCT in the last 72  hours.  Coagulation profile No results for input(s): INR, PROTIME in the last 168 hours.  No results for input(s): DDIMER in the last 72 hours.  Cardiac Enzymes No results for input(s): CKMB, TROPONINI, MYOGLOBIN in the last 168 hours.  Invalid input(s): CK ------------------------------------------------------------------------------------------------------------------    Component Value Date/Time   BNP 1,014.6 (H) 06/13/2016 0538    Inpatient Medications  Scheduled Meds: . aspirin  81 mg Oral Daily  . atorvastatin  10 mg Oral Daily  . carvedilol  6.25 mg Oral BID WC  . ceFEPime (MAXIPIME) IV  1 g Intravenous BID  . cholecalciferol  1,000 Units Oral Daily  . donepezil  10 mg Oral QHS  . fluticasone furoate-vilanterol  1 puff Inhalation Q breakfast  . furosemide  40 mg Oral Daily  . heparin  5,000 Units Subcutaneous Q8H  . losartan  25 mg Oral Daily  . mirabegron ER  50 mg Oral Q breakfast  . phenytoin  200 mg Oral BID  . spironolactone  25 mg Oral q morning - 10a  . vancomycin  500 mg Intravenous Q12H   Continuous Infusions: PRN Meds:.bismuth subsalicylate, dextromethorphan, nitroGLYCERIN  Micro Results Recent Results (from the past 240 hour(s))  Culture, blood (routine x 2) Call MD if unable to obtain prior to antibiotics being given     Status: None (Preliminary result)   Collection Time: 06/13/16  5:54 AM  Result Value Ref Range Status   Specimen Description BLOOD LEFT HAND  Final   Special Requests IN PEDIATRIC BOTTLE 3CC  Final   Culture  Setup Time   Final    Organism ID to follow GRAM POSITIVE RODS IN PEDIATRIC BOTTLE    Culture   Final    NO GROWTH 1 DAY Performed at [facility], [place] [address] [place], [place] [address]    Report Status PENDING  Incomplete  Culture, blood (routine x 2) Call MD if unable to obtain prior to antibiotics being given     Status: None (Preliminary result)   Collection Time: 06/13/16  6:15 AM  Result Value Ref Range  Status   Specimen Description BLOOD LEFT HAND  Final   Special Requests IN PEDIATRIC BOTTLE 3CC  Final   Culture   Final    NO GROWTH 1 DAY Performed at [facility], [place] [address] [place], [place] [address]    Report Status PENDING  Incomplete  Culture, sputum-assessment     Status: None   Collection Time: 06/13/16  7:10 AM  Result Value Ref Range Status   Specimen Description SPUTUM  Final   Special Requests NONE  Final   Sputum evaluation   Final    Sputum specimen not acceptable for testing.  Please recollect.   Hurley Cisco, Darnell Level RN @0908  ON 3.13.18 BY NMCCOY    Report Status 06/13/2016 FINAL  Final  MRSA PCR Screening     Status: None   Collection Time: 06/13/16  9:30 AM  Result Value Ref Range Status   MRSA by PCR NEGATIVE NEGATIVE Final    Comment:        The GeneXpert MRSA Assay (FDA approved for NASAL specimens only), is one component of a comprehensive MRSA colonization surveillance program. It is not intended to diagnose MRSA infection nor to guide or monitor treatment for MRSA infections.   Culture, sputum-assessment     Status: None   Collection Time: 06/13/16  8:03 PM  Result Value Ref Range Status   Specimen Description SPU  Final   Special Requests Normal  Final   Sputum evaluation   Final    Sputum specimen not acceptable for testing.  Please recollect.     Report Status 06/13/2016 FINAL  Final    Radiology Reports Dg Chest 2 View  Result Date: 05/17/2016 CLINICAL DATA:  Dyspnea x3 days with cough.  No fever. EXAM: CHEST  2 VIEW COMPARISON:  12/04/2015 CXR FINDINGS: Stable cardiomegaly with tortuous thoracic aorta. Mild interstitial edema. Faint subpleural areas of interstitial fibrosis bilaterally especially at the lung bases. Trace edema along the minor fissure with evolving atelectatic change. Old right-sided rib fractures. No acute nor suspicious osseous abnormality. No pneumothorax or effusion. IMPRESSION: Cardiomegaly with mild  interstitial edema since prior exam. Trace edema/fluid in the minor fissure with adjacent area bandlike atelectasis. Electronically Signed   By: Ashley Royalty M.D.   On: 05/17/2016 17:50   Dg Chest Port 1 View  Result Date: 06/14/2016 CLINICAL DATA:  Pneumonia EXAM: PORTABLE CHEST 1 VIEW COMPARISON:  June 13, 2016 and May 17, 2016 FINDINGS: There is underlying interstitial fibrosis. There has been interval partial clearing of airspace opacity in the right mid lung peripherally. There are bilateral pleural effusions. There is cardiomegaly with pulmonary venous hypertension. No evident adenopathy. Bones appear somewhat osteoporotic. There is atherosclerotic calcification in the aorta. IMPRESSION: Interval partial clearing of airspace opacity from the right mid lung peripherally. Patchy opacity remains in this area, likely incomplete clearing of pneumonia in this area. Elsewhere, the interstitium is diffusely prominent which most likely is due to a combination of interstitial pulmonary edema and underlying fibrosis. There is cardiomegaly with pulmonary venous hypertension and bilateral pleural effusions. There is felt to be a degree of congestive heart failure. There is aortic atherosclerosis. No new opacity is evident compared to 1 day prior ; there appears to be slightly less interstitial edema compared to 1 day prior. Electronically Signed   By: Lowella Grip III M.D.   On: 06/14/2016 07:16   Dg Chest Port 1 View  Result Date: 06/13/2016 CLINICAL DATA:  Increasing shortness of breath, hypoxia beginning at midnight. History of COPD. EXAM: PORTABLE CHEST 1 VIEW COMPARISON:  Chest radiograph May 17, 2016 FINDINGS: Cardiac silhouette is moderately enlarged. Tortuous calcified aorta. Diffuse interstitial prominence, patchy bibasilar and RIGHT mid lung zone airspace opacities and small pleural effusions. No pneumothorax. Soft tissue planes and included osseous structures are nonsuspicious. IMPRESSION:  Severe interstitial prominence concerning for atypical infection, with bibasilar and RIGHT mid lung zone atelectasis versus focal consolidation/pneumonia. Small pleural effusions. Stable cardiomegaly and COPD. Electronically Signed   By: Elon Alas M.D.   On: 06/13/2016 05:18    Adalena Abdulla M.D on 06/14/2016 at 12:34 PM  Between 7am to 7pm - Pager - [phone]  After 7pm go to [email or web address] - password [facility]  [name] -  Office  [phone]

## 2016-06-15 ENCOUNTER — Encounter: Payer: Self-pay | Admitting: Cardiovascular Disease

## 2016-06-15 LAB — URINE CULTURE: Culture: NO GROWTH

## 2016-06-15 LAB — CULTURE, BLOOD (ROUTINE X 2)

## 2016-06-15 LAB — BASIC METABOLIC PANEL
Anion gap: 6 (ref 5–15)
BUN: 16 mg/dL (ref 6–20)
CALCIUM: 8.7 mg/dL — AB (ref 8.9–10.3)
CO2: 23 mmol/L (ref 22–32)
Chloride: 108 mmol/L (ref 101–111)
Creatinine, Ser: 1.17 mg/dL (ref 0.61–1.24)
GFR, EST NON AFRICAN AMERICAN: 57 mL/min — AB (ref >60)
GLUCOSE: 83 mg/dL (ref 65–99)
Potassium: 3.7 mmol/L (ref 3.5–5.1)
SODIUM: 137 mmol/L (ref 135–145)

## 2016-06-15 MED ORDER — LOPERAMIDE HCL 2 MG PO CAPS
4.0000 mg | ORAL_CAPSULE | Freq: Once | ORAL | Status: AC
  Administered 2016-06-15: 4 mg via ORAL
  Filled 2016-06-15: qty 2

## 2016-06-15 NOTE — Evaluation (Signed)
Physical Therapy Evaluation Patient Details Name: [name] MRN: [id number] DOB: 1932/11/25 Today's Date: 06/15/2016   History of Present Illness  Pt was admitted for SOB  Hypoxia, pneumonia on 06/13/16. [facility]cent admission  with acute or chronic CHF.  PMH:  dementia, HTN, COPD, seizues, indwelling catheter and legal blindness  Clinical Impression  The patient is very pleasant  And interested in ambulation in order to return to ALF.   Patient Saturations on Room Air at Rest = 96% Patient Saturations on [employer] while Ambulating =first short walk=94% after 120' =87%. Patient fatigued so did not ambulate again on O2. Pt admitted with above diagnosis. Pt currently with functional limitations due to the deficits listed below (see PT Problem List).  Pt will benefit from skilled PT to increase their independence and safety with mobility to allow discharge to the venue listed below.     :    Follow Up Recommendations Home health PT;Supervision - Intermittent    Equipment Recommendations  None recommended by PT    Recommendations for Other Services       Precautions / Restrictions Precautions Precautions: Fall Precaution Comments: monitor sats      Mobility  Bed Mobility               General bed mobility comments: in recliner  Transfers Overall transfer level: Needs assistance Equipment used: Rolling walker (2 wheeled) Transfers: Sit to/from Stand Sit to Stand: Supervision            Ambulation/Gait Ambulation/Gait assistance: Min guard Ambulation Distance (Feet): 70 Feet (then 125) Assistive device: Rolling walker (2 wheeled) Gait Pattern/deviations: Step-through pattern     General Gait Details: physical cues  to guide RW around obstacles. dyspnea 3/4, instructed in pursed lip breaths. No oxygen used with oxygen sats  95% first walk, 87% second, longer walk.   Stairs            Wheelchair Mobility    Modified Rankin (Stroke Patients Only)        Balance                                             Pertinent Vitals/Pain Pain Assessment: No/denies pain    Home Living Family/patient expects to be discharged to:: Assisted living     Type of Home: Assisted living Home Access: Level entry     Home Layout: One level Home Equipment: [place] - 2 wheels;Cane - single point;Shower seat      Prior Function Level of Independence: Independent with assistive device(s)         Comments: does own self care. 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-taken from previous encounter     Hand Dominance        Extremity/Trunk Assessment   Upper Extremity Assessment Upper Extremity Assessment: Generalized weakness    Lower Extremity Assessment Lower Extremity Assessment: Generalized weakness    Cervical / Trunk Assessment Cervical / Trunk Assessment: Kyphotic  Communication   Communication: HOH  Cognition Arousal/Alertness: Awake/alert Behavior During Therapy: WFL for tasks assessed/performed Overall Cognitive Status: Within Functional Limits for tasks assessed                      General Comments      Exercises     Assessment/Plan    PT Assessment Patient needs continued PT  services  PT Problem List Decreased strength;Decreased activity tolerance;Decreased mobility;Cardiopulmonary status limiting activity;Decreased knowledge of precautions;Decreased safety awareness;Decreased knowledge of use of DME       PT Treatment Interventions DME instruction;Gait training;Functional mobility training;Therapeutic activities;Patient/family education    PT Goals (Current goals can be found in the Care Plan section)  Acute Rehab PT Goals Patient Stated Goal: to walk without getting short of breath PT Goal Formulation: With patient Time For Goal Achievement: 06/29/16 Potential to Achieve Goals: Good    Frequency Min 3X/week   Barriers to discharge        Co-evaluation                End of Session Equipment Utilized During Treatment: Gait belt Activity Tolerance: Patient tolerated treatment well Patient left: in chair;with call bell/phone within reach;with chair alarm set;with nursing/sitter in room Nurse Communication: Mobility status PT Visit Diagnosis: Unsteadiness on feet (R26.81);Difficulty in walking, not elsewhere classified (R26.2)         Time: 3299-2426 PT Time Calculation (min) (ACUTE ONLY): 36 min   Charges:   PT Evaluation $PT Eval Low Complexity: 1 Procedure PT Treatments $Gait Training: 8-22 mins   PT G Codes:         Claretha Cooper 06/15/2016, 12:24 PM Tresa Endo PT [phone]

## 2016-06-15 NOTE — Progress Notes (Signed)
PROGRESS NOTE                                                                                                                                                                                                             Patient Demographics:    [name], is a 83 y.o. male, DOB - [birth year], WRU:[id number]  Admit date - 06/13/2016   Admitting Physician Albertine Patricia, MD  Outpatient Primary MD for the patient is Tivis Ringer, MD  LOS - 3  Chief Complaint  Patient presents with  . Shortness of Breath       Brief Narrative   83 y.o. male, with a hx of COPD, coronary artery disease, chronic combined CHF, dementia, hypertension, seizure disorder, urethral stricture with chronic indwelling Foley catheter, and blindness, who was recently discharged in February with acute CHF(known EF 15-20%), patient presents with progressive dyspnea, Workup significant of pneumonia.   Subjective:    [name] today has, No headache, No chest pain, No abdominal pain - Reports some cough, less productive, reports he is feeling much better today, dyspnea has improved .   Assessment  & Plan :    Active Problems:   Hypertension   Seizure disorder ([place])   CAD (coronary artery disease)   Dementia   Chronic CHF ([place])   HCAP (healthcare-associated pneumonia)   Acute hypoxic respiratory failure - Secondary to pneumonia - Presents with hypoxia, requiring BiPAP initially, significant improved, the requirement of BiPAP overnight, and on 3 L nasal cannula, wean as tolerated.  HCAP - Recent hospitalization, initial x-ray with multifocal pneumonia, appears to be improving and repeat chest x-ray today . - Empirically on IV vancomycin and cefepime, blood cultures with no growth to date, sputum culture with no growth as well, will discontinue vancomycin, and continue with IV cefepime . -  One set of blood culture growing Diphtheroids, this is most likely  contaminant  Chronic combined systolic and diastolic CHF - Most recent echo with EF 15-20%, even significant elevated BNP, no JVD,  no lower extremity edema, appears to be euvolemic, continue with home dose Lasix and Aldactone . - Continue with Coreg.  Hypertension - Continue with home medication Coreg, Aldactone and losartan  Urinary retention - Patient with chronic Foley, positive urinalysis, will change Foley catheter during hospital stay , discussed with Dr. Loel Lofty, will  follow on urine cultures  CAD  - History of stent to LAD - ContinuedASA 81, Lipitor, Coreg and  losartan  - Denies chest pain, stable   Dementia - Appears to be stable on admission; pt calm, cooperative, provides history and appropriate, awake alert 3 - Plan to continue Aricept   Seizure disorder  - No recent seizures  - Continue Dilantin   Code Status : Full code, this was confirmed by the patient(even though he had DO NOT RESUSCITATE form previous admission, he revoked it)  Family Communication  : None at bedside  Disposition Plan  : back to SNF in 1-2 days  Consults  :  None  Procedures  : None  DVT Prophylaxis  :  Heparin - SCDs   Lab Results  Component Value Date   PLT 211 06/14/2016    Antibiotics  :    Anti-infectives    Start     Dose/Rate Route Frequency Ordered Stop   06/13/16 2200  vancomycin (VANCOCIN) 500 mg in sodium chloride 0.9 % 100 mL IVPB  Status:  Discontinued     500 mg 100 mL/hr over 60 Minutes Intravenous Every 12 hours 06/13/16 0559 06/14/16 1236   06/13/16 0600  ceFEPIme (MAXIPIME) 1 g in dextrose 5 % 50 mL IVPB     1 g 100 mL/hr over 30 Minutes Intravenous 2 times daily 06/13/16 0549 06/21/16 0759   06/13/16 0600  vancomycin (VANCOCIN) IVPB 1000 mg/200 mL premix     1,000 mg 200 mL/hr over 60 Minutes Intravenous  Once 06/13/16 0558 06/13/16 0808        Objective:   Vitals:   06/15/16 0516 06/15/16 0754 06/15/16 1120 06/15/16 1337  BP: 124/68 132/79   107/62  Pulse: 74 75  74  Resp:    16  Temp: 98.2 F (36.8 C)   97.6 F (36.4 C)  TempSrc: Oral   Oral  SpO2: 97%  98% 98%  Weight:      Height:        Wt Readings from Last 3 Encounters:  06/13/16 68 kg (150 lb)  05/22/16 69 kg (152 lb 1.9 oz)  02/27/16 69.9 kg (154 lb)     Intake/Output Summary (Last 24 hours) at 06/15/16 1358 Last data filed at 06/15/16 0800  Gross per 24 hour  Intake              240 ml  Output             1175 ml  Net             -935 ml     Physical Exam  Awake Alert, Oriented X 3, No new F.N deficits, Normal affect Supple Neck,No JVD,  Symmetrical Chest wall movement, Good air movement bilaterally, CTAB RRR,No Gallops,Rubs or new Murmurs, No Parasternal Heave +ve B.Sounds, Abd Soft, No tenderness,  No rebound - guarding or rigidity. No Cyanosis, Clubbing or edema, No new Rash or bruise      Data Review:    CBC  Recent Labs Lab 06/13/16 0538 06/14/16 0710  WBC 12.0* 7.3  HGB 12.9* 11.6*  HCT 39.2 35.3*  PLT 248 211  MCV 90.1 88.3  MCH 29.7 29.0  MCHC 32.9 32.9  RDW 13.0 12.8  LYMPHSABS 0.9  --   MONOABS 1.0  --   EOSABS 0.5  --   BASOSABS 0.1  --     Chemistries   Recent Labs Lab 06/13/16 0519 06/14/16 0710 06/15/16 0541  NA 140 137 137  K 3.6 3.7 3.7  CL 112* 107 108  CO2 21* 24 23  GLUCOSE 108* 83 83  BUN 15 15 16   CREATININE 1.03 1.27* 1.17  CALCIUM 9.0 8.5* 8.7*  AST 29  --   --   ALT 20  --   --   ALKPHOS 85  --   --   BILITOT 0.7  --   --    ------------------------------------------------------------------------------------------------------------------ No results for input(s): CHOL, HDL, LDLCALC, TRIG, CHOLHDL, LDLDIRECT in the last 72 hours.  Lab Results  Component Value Date   HGBA1C 5.3 07/01/2015   ------------------------------------------------------------------------------------------------------------------ No results for input(s): TSH, T4TOTAL, T3FREE, THYROIDAB in the last 72  hours.  Invalid input(s): FREET3 ------------------------------------------------------------------------------------------------------------------ No results for input(s): VITAMINB12, FOLATE, FERRITIN, TIBC, IRON, RETICCTPCT in the last 72 hours.  Coagulation profile No results for input(s): INR, PROTIME in the last 168 hours.  No results for input(s): DDIMER in the last 72 hours.  Cardiac Enzymes No results for input(s): CKMB, TROPONINI, MYOGLOBIN in the last 168 hours.  Invalid input(s): CK ------------------------------------------------------------------------------------------------------------------    Component Value Date/Time   BNP 1,014.6 (H) 06/13/2016 0538    Inpatient Medications  Scheduled Meds: . aspirin  81 mg Oral Daily  . atorvastatin  10 mg Oral Daily  . carvedilol  6.25 mg Oral BID WC  . ceFEPime (MAXIPIME) IV  1 g Intravenous BID  . cholecalciferol  1,000 Units Oral Daily  . donepezil  10 mg Oral QHS  . fluticasone furoate-vilanterol  1 puff Inhalation Q breakfast  . furosemide  40 mg Oral Daily  . heparin  5,000 Units Subcutaneous Q8H  . losartan  25 mg Oral Daily  . mirabegron ER  50 mg Oral Q breakfast  . phenytoin  200 mg Oral BID  . spironolactone  25 mg Oral q morning - 10a   Continuous Infusions: PRN Meds:.bismuth subsalicylate, dextromethorphan, nitroGLYCERIN  Micro Results Recent Results (from the past 240 hour(s))  Culture, blood (routine x 2) Call MD if unable to obtain prior to antibiotics being given     Status: Abnormal   Collection Time: 06/13/16  5:54 AM  Result Value Ref Range Status   Specimen Description BLOOD LEFT HAND  Final   Special Requests IN PEDIATRIC BOTTLE 3CC  Final   Culture  Setup Time   Final    Organism ID to follow GRAM POSITIVE RODS IN PEDIATRIC BOTTLE CRITICAL RESULT CALLED TO, READ BACK BY AND VERIFIED WITH: N. Glogovac Pharm.D. 13:05 06/14/13 ([email or web address])    Culture (A)  Final    DIPHTHEROIDS(CORYNEBACTERIUM  SPECIES) Standardized susceptibility testing for this organism is not available. Performed at [facility], [place] [address] [place], [place] [address]    Report Status 06/15/2016 FINAL  Final  Blood Culture ID Panel (Reflexed)     Status: None   Collection Time: 06/13/16  5:54 AM  Result Value Ref Range Status   Enterococcus species NOT DETECTED NOT DETECTED Final   Listeria monocytogenes NOT DETECTED NOT DETECTED Final   Staphylococcus species NOT DETECTED NOT DETECTED Final   Staphylococcus aureus NOT DETECTED NOT DETECTED Final   Streptococcus species NOT DETECTED NOT DETECTED Final   Streptococcus agalactiae NOT DETECTED NOT DETECTED Final   Streptococcus pneumoniae NOT DETECTED NOT DETECTED Final   Streptococcus pyogenes NOT DETECTED NOT DETECTED Final   Acinetobacter baumannii NOT DETECTED NOT DETECTED Final   Enterobacteriaceae species NOT DETECTED NOT DETECTED Final   Enterobacter cloacae complex NOT  DETECTED NOT DETECTED Final   Escherichia coli NOT DETECTED NOT DETECTED Final   Klebsiella oxytoca NOT DETECTED NOT DETECTED Final   Klebsiella pneumoniae NOT DETECTED NOT DETECTED Final   Proteus species NOT DETECTED NOT DETECTED Final   Serratia marcescens NOT DETECTED NOT DETECTED Final   Haemophilus influenzae NOT DETECTED NOT DETECTED Final   Neisseria meningitidis NOT DETECTED NOT DETECTED Final   Pseudomonas aeruginosa NOT DETECTED NOT DETECTED Final   Candida albicans NOT DETECTED NOT DETECTED Final   Candida glabrata NOT DETECTED NOT DETECTED Final   Candida krusei NOT DETECTED NOT DETECTED Final   Candida parapsilosis NOT DETECTED NOT DETECTED Final   Candida tropicalis NOT DETECTED NOT DETECTED Final    Comment: Performed at [facility], [place] [address] [place], [place] [address]  Culture, blood (routine x 2) Call MD if unable to obtain prior to antibiotics being given     Status: None (Preliminary result)   Collection Time: 06/13/16  6:15 AM  Result  Value Ref Range Status   Specimen Description BLOOD LEFT HAND  Final   Special Requests IN PEDIATRIC BOTTLE 3CC  Final   Culture   Final    NO GROWTH 1 DAY Performed at [facility], [place] [address] [place], [place] [address]    Report Status PENDING  Incomplete  Culture, sputum-assessment     Status: None   Collection Time: 06/13/16  7:10 AM  Result Value Ref Range Status   Specimen Description SPUTUM  Final   Special Requests NONE  Final   Sputum evaluation   Final    Sputum specimen not acceptable for testing.  Please recollect.   INFORMED Glena Norfolk RN @0908  ON 3.13.18 BY NMCCOY    Report Status 06/13/2016 FINAL  Final  MRSA PCR Screening     Status: None   Collection Time: 06/13/16  9:30 AM  Result Value Ref Range Status   MRSA by PCR NEGATIVE NEGATIVE Final    Comment:        The GeneXpert MRSA Assay (FDA approved for NASAL specimens only), is one component of a comprehensive MRSA colonization surveillance program. It is not intended to diagnose MRSA infection nor to guide or monitor treatment for MRSA infections.   Culture, sputum-assessment     Status: None   Collection Time: 06/13/16  8:03 PM  Result Value Ref Range Status   Specimen Description SPU  Final   Special Requests Normal  Final   Sputum evaluation   Final    Sputum specimen not acceptable for testing.  Please recollect.     Report Status 06/13/2016 FINAL  Final    Radiology Reports Dg Chest 2 View  Result Date: 05/17/2016 CLINICAL DATA:  Dyspnea x3 days with cough.  No fever. EXAM: CHEST  2 VIEW COMPARISON:  12/04/2015 CXR FINDINGS: Stable cardiomegaly with tortuous thoracic aorta. Mild interstitial edema. Faint subpleural areas of interstitial fibrosis bilaterally especially at the lung bases. Trace edema along the minor fissure with evolving atelectatic change. Old right-sided rib fractures. No acute nor suspicious osseous abnormality. No pneumothorax or effusion. IMPRESSION: Cardiomegaly with  mild interstitial edema since prior exam. Trace edema/fluid in the minor fissure with adjacent area bandlike atelectasis. Electronically Signed   By: Ashley Royalty M.D.   On: 05/17/2016 17:50   Dg Chest Port 1 View  Result Date: 06/14/2016 CLINICAL DATA:  Pneumonia EXAM: PORTABLE CHEST 1 VIEW COMPARISON:  June 13, 2016 and May 17, 2016 FINDINGS: There is  underlying interstitial fibrosis. There has been interval partial clearing of airspace opacity in the right mid lung peripherally. There are bilateral pleural effusions. There is cardiomegaly with pulmonary venous hypertension. No evident adenopathy. Bones appear somewhat osteoporotic. There is atherosclerotic calcification in the aorta. IMPRESSION: Interval partial clearing of airspace opacity from the right mid lung peripherally. Patchy opacity remains in this area, likely incomplete clearing of pneumonia in this area. Elsewhere, the interstitium is diffusely prominent which most likely is due to a combination of interstitial pulmonary edema and underlying fibrosis. There is cardiomegaly with pulmonary venous hypertension and bilateral pleural effusions. There is felt to be a degree of congestive heart failure. There is aortic atherosclerosis. No new opacity is evident compared to 1 day prior ; there appears to be slightly less interstitial edema compared to 1 day prior. Electronically Signed   By: Lowella Grip III M.D.   On: 06/14/2016 07:16   Dg Chest Port 1 View  Result Date: 06/13/2016 CLINICAL DATA:  Increasing shortness of breath, hypoxia beginning at midnight. History of COPD. EXAM: PORTABLE CHEST 1 VIEW COMPARISON:  Chest radiograph May 17, 2016 FINDINGS: Cardiac silhouette is moderately enlarged. Tortuous calcified aorta. Diffuse interstitial prominence, patchy bibasilar and RIGHT mid lung zone airspace opacities and small pleural effusions. No pneumothorax. Soft tissue planes and included osseous structures are nonsuspicious.  IMPRESSION: Severe interstitial prominence concerning for atypical infection, with bibasilar and RIGHT mid lung zone atelectasis versus focal consolidation/pneumonia. Small pleural effusions. Stable cardiomegaly and COPD. Electronically Signed   By: Elon Alas M.D.   On: 06/13/2016 05:18    Ethelda Deangelo M.D on 06/15/2016 at 1:58 PM  Between 7am to 7pm - Pager - [phone]  After 7pm go to [email or web address] - password [facility]  [name] -  Office  [phone]

## 2016-06-16 ENCOUNTER — Emergency Department (HOSPITAL_COMMUNITY)
Admission: EM | Admit: 2016-06-16 | Discharge: 2016-06-16 | Disposition: A | Discharge status: Home / Self Care | Payer: Medicare Other | Attending: Emergency Medicine | Admitting: Emergency Medicine

## 2016-06-16 ENCOUNTER — Encounter (HOSPITAL_COMMUNITY): Payer: Self-pay | Admitting: Emergency Medicine

## 2016-06-16 DIAGNOSIS — I2510 Atherosclerotic heart disease of native coronary artery without angina pectoris: Secondary | ICD-10-CM | POA: Insufficient documentation

## 2016-06-16 DIAGNOSIS — I5043 Acute on chronic combined systolic (congestive) and diastolic (congestive) heart failure: Secondary | ICD-10-CM | POA: Insufficient documentation

## 2016-06-16 DIAGNOSIS — Z9981 Dependence on supplemental oxygen: Secondary | ICD-10-CM | POA: Insufficient documentation

## 2016-06-16 DIAGNOSIS — J9601 Acute respiratory failure with hypoxia: Secondary | ICD-10-CM

## 2016-06-16 DIAGNOSIS — Z7982 Long term (current) use of aspirin: Secondary | ICD-10-CM | POA: Insufficient documentation

## 2016-06-16 DIAGNOSIS — I110 Hypertensive heart disease with heart failure: Secondary | ICD-10-CM | POA: Insufficient documentation

## 2016-06-16 DIAGNOSIS — J449 Chronic obstructive pulmonary disease, unspecified: Secondary | ICD-10-CM | POA: Insufficient documentation

## 2016-06-16 DIAGNOSIS — Z955 Presence of coronary angioplasty implant and graft: Secondary | ICD-10-CM | POA: Insufficient documentation

## 2016-06-16 DIAGNOSIS — F0390 Unspecified dementia without behavioral disturbance: Secondary | ICD-10-CM

## 2016-06-16 DIAGNOSIS — I252 Old myocardial infarction: Secondary | ICD-10-CM | POA: Insufficient documentation

## 2016-06-16 DIAGNOSIS — Z87891 Personal history of nicotine dependence: Secondary | ICD-10-CM | POA: Insufficient documentation

## 2016-06-16 MED ORDER — LEVOFLOXACIN 750 MG PO TABS: 750.0000 mg | ORAL_TABLET | ORAL | 2 refills | Status: AC

## 2016-06-16 MED ORDER — LEVOFLOXACIN 750 MG PO TABS
750.0000 mg | ORAL_TABLET | ORAL | Status: DC
  Administered 2016-06-16: 750 mg via ORAL
  Filled 2016-06-16: qty 1

## 2016-06-16 MED ORDER — LEVOFLOXACIN 750 MG PO TABS: 750.0000 mg | ORAL_TABLET | ORAL | 2 refills | Status: DC

## 2016-06-16 MED ORDER — GUAIFENESIN ER 600 MG PO TB12: 600.0000 mg | ORAL_TABLET | Freq: Two times a day (BID) | ORAL | 0 refills | Status: AC

## 2016-06-16 NOTE — Progress Notes (Signed)
Pt transferred to [facility]. Report called. No questions or concerns at this time.  Tareva Leske [place], rN

## 2016-06-16 NOTE — ED Notes (Signed)
Called [facility] for transport.  Operator number is [employer]

## 2016-06-16 NOTE — Progress Notes (Signed)
SATURATION QUALIFICATIONS: (This note is used to comply with regulatory documentation for home oxygen)  Patient Saturations on Room Air at Rest = 93%  Patient Saturations on Room Air while Ambulating =84%  Patient Saturations on 3 Liters of oxygen while Ambulating = 89%  Please briefly explain why patient needs home oxygen:

## 2016-06-16 NOTE — ED Notes (Signed)
Facility called and let me know that oxygen as arrived and is being set up and that I could set up for transport.

## 2016-06-16 NOTE — Progress Notes (Signed)
[id number] at [facility]/unable to take patient due to the 1500cc fliud restriction/tct-Dr, Elgergawy-will take off fld. Restrictions so that patient may go to ALF./tct-Britt[place]/fld restriction remvoed / ALF is aware of the o2 needs./Rhonda Davis,BSN,RN3,CCM/[phone]

## 2016-06-16 NOTE — ED Notes (Signed)
Patient is resting comfortably. 

## 2016-06-16 NOTE — Discharge Summary (Addendum)
[name], is a 83 y.o. male  DOB 02-Mar-1933  MRN [id number].  Admission date:  06/13/2016  Admitting Physician  Allen Patricia, MD  Discharge Date:  06/16/2016   Primary MD  Allen Ringer, MD  Recommendations for primary care physician for things to follow:  - Please check CBC, BMP in 3 days, repeat 2 view chest x-ray in 1-2 weeks to ensure resolution of pneumonia - Patient is discharged on 3 L nasal cannula, wean as tolerated  CODE STATUS: DO NOT RESUSCITATE, confirmed by HCPOA  Admission Diagnosis  SOB (shortness of breath) [R06.02] Hypoxia [R09.02] HCAP (healthcare-associated pneumonia) [J18.9]   Discharge Diagnosis  SOB (shortness of breath) [R06.02] Hypoxia [R09.02] HCAP (healthcare-associated pneumonia) [J18.9]   Active Problems:   Hypertension   Seizure disorder ([place])   CAD (coronary artery disease)   Dementia   Chronic CHF ([place])   HCAP (healthcare-associated pneumonia)      Past Medical History:  Diagnosis Date  . Acute MI Dec 2010   cardiac catheriziation and stenting of the LAD (bare metal) 03/14/09              . Anemia   . Angina   . Blood transfusion   . CAD (coronary artery disease)   . COPD (chronic obstructive pulmonary disease) ([place])   . DEMENTIA   . High cholesterol   . Hypertension   . LV dysfunction    EF 25 to 30% per echo May 2012  . NSTEMI (non-ST elevated myocardial infarction) [facility]) Nov 2011   with LV dysfunction  . Prostatic hypertrophy   . Seizures ([place]) ~ 2006   "when blood pressure went up too high"; on Dilantin  . Urethral stricture    with subsequent trauma and has an indwelling Foley catheter in place during his first MI                                                 . Urinary catheter in place    chronic  . Vision impairment    "legally blind"    Past Surgical History:  Procedure Laterality Date  . CARDIAC CATHETERIZATION   02/2010  . 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 CHOLECYSTECTOMY  05/30/11  . CHOLECYSTECTOMY  05/30/2011   Procedure: LAPAROSCOPIC CHOLECYSTECTOMY;  Surgeon: Judieth Keens, DO;  Location: [facility];  Service: General;  Laterality: N/A;  . CORONARY ANGIOPLASTY WITH STENT PLACEMENT  03/2009   "1"; stent to LAD  . TONSILLECTOMY     "when I was small"       History of present illness and  Hospital Course:     Kindly see H&P for history of present illness and admission details, please review complete Labs, Consult reports and Test reports for all details in brief  HPI  from the  history and physical done on the day of admission 06/13/2016   [name]  is a 83 y.o. male, with a hx of COPD, coronary artery disease, chronic combined CHF, dementia, hypertension, seizure disorder, urethral stricture with chronic indwelling Foley catheter, and blindness, who was recently discharged in February with acute CHF(known EF 15-20%), patient presents with progressive dyspnea, found to be hypoxic by EMS at SNF of 79%, requiring BiPAP in ED, patient denies any fever, chills, chest pain, coffee-ground emesis, nausea or vomiting but reports cough, productive with brown phlegm, and exertional dyspnea, denies any orthopnea, chest x-ray significant for pneumonia, no JVD or lower extremity edema, Patient  was started on IV vancomycin and cefepime for HCAP treatment, I was called to admit   Hospital Course  83 y.o.male,with a hx of COPD, coronary artery disease, chronic combined CHF, dementia, hypertension, seizure disorder, urethral stricture with chronic indwelling Foley catheter, and blindness, who was recently discharged in February with acute CHF(known EF 15-20%), patient presents with progressive dyspnea, Workup significant of pneumonia.  Acute hypoxic respiratory failure - Secondary to pneumonia - Presents with hypoxia, requiring BiPAP initially, significant  improved, the requirement of BiPAP overnight, currently on 3 L nasal cannula, will discharge with oxygen, continue to wean as tolerated at accepting facility  HCAP - Recent hospitalization, initial x-ray with multifocal pneumonia, appears to be improving and repeat chest x-ray. - Empirically on IV vancomycin and cefepime, blood cultures with no growth to date, sputum culture with no growth as well, stopped vancomycin 3/14, treated with 7 pound during hospital stay, will discharge on oral levofloxacin.  -  One set of blood culture growing Diphtheroids, this is most likely contaminant  Chronic combined systolic and diastolic CHF - Most recent echo with EF 15-20%, even significant elevated BNP, no JVD,  no lower extremity edema, appears to be euvolemic, continue with home dose Lasix and Aldactone . - Continue with Coreg.  Hypertension - Continue with home medication Coreg, Aldactone and losartan  Urinary retention - Patient with chronic Foley, positive urinalysis, but urine cultures are negative, Foley catheter changed during hospital stay on 06/15/2016  CAD  - History of stent to LAD - ContinuedASA 81, Lipitor, Coreg and losartan  - Denies chest pain, stable   Dementia - Appears to be stable on admission; pt calm, cooperative, provides history and appropriate - Plan to continue Aricept   Seizure disorder  - No recent seizures  - Continue Dilantin   CODE STATUS is DO NOT RESUSCITATE, during hospital stay, patient expresses wishes to be full code, but this issue was readdressed prior to discharge with patient, and HCPOA and  DO NOT RESUSCITATE CODE STATUS was reestablished.  Discharge Condition:  Stable   Follow UP  Follow-up Information    Allen R, MD Follow up in 1 week(s).   Specialty:  Internal Medicine Contact information: [address] [place] [address] [phone]             Discharge Instructions  and  Discharge Medications      Discharge Instructions    Discharge instructions    Complete by:  As directed    Follow with Primary MD Allen Ringer, MD in 7 days   Get CBC, CMP, 2 view Chest X ray checked  by Primary MD next visit.    Activity: As tolerated with Full fall precautions use walker/cane & assistance as needed   Disposition SNF   Diet: Heart Healthy   For Heart failure patients - Check your  Weight same time everyday, if you gain over 2 pounds, or you develop in leg swelling, experience more shortness of breath or chest pain, call your Primary MD immediately. Follow Cardiac Low Salt Diet and 1.5 lit/day fluid restriction.   On your next visit with your primary care physician please Get Medicines reviewed and adjusted.   Please request your Prim.MD to go over all Hospital Tests and Procedure/Radiological results at the follow up, please get all Hospital records sent to your Prim MD by signing hospital release before you go home.   If you experience worsening of your admission symptoms, develop shortness of breath, life threatening emergency, suicidal or homicidal thoughts you must seek medical attention immediately by calling 911 or calling your MD immediately  if symptoms less severe.  You Must read complete instructions/literature along with all the possible adverse reactions/side effects for all the Medicines you take and that have been prescribed to you. Take any new Medicines after you have completely understood and accpet all the possible adverse reactions/side effects.   Do not drive, operating heavy machinery, perform activities at heights, swimming or participation in water activities or provide baby sitting services if your were admitted for syncope or siezures until you have seen by Primary MD or a Neurologist and advised to do so again.  Do not drive when taking Pain medications.    Do not take more than prescribed Pain, Sleep and Anxiety Medications  Special Instructions: If  you have smoked or chewed Tobacco  in the last 2 yrs please stop smoking, stop any regular Alcohol  and or any Recreational drug use.  Wear Seat belts while driving.   Please note  You were cared for by a hospitalist during your hospital stay. If you have any questions about your discharge medications or the care you received while you were in the hospital after you are discharged, you can call the unit and asked to speak with the hospitalist on call if the hospitalist that took care of you is not available. Once you are discharged, your primary care physician will handle any further medical issues. Please note that NO REFILLS for any discharge medications will be authorized once you are discharged, as it is imperative that you return to your primary care physician (or establish a relationship with a primary care physician if you do not have one) for your aftercare needs so that they can reassess your need for medications and monitor your lab values.   Increase activity slowly    Complete by:  As directed      Allergies as of 06/16/2016      Reactions   Red Dye Other (See Comments)   Per Mar.   Iodinated Diagnostic Agents Rash   Pt given 50 mg Benadryl prior to Omnipaque injection, tolerated well.      Medication List    TAKE these medications   acetaminophen 500 MG tablet Commonly known as:  TYLENOL Take 1,000 mg by mouth every 8 (eight) hours as needed for moderate pain.   aspirin 81 MG chewable tablet Chew 81 mg by mouth daily.   atorvastatin 10 MG tablet Commonly known as:  LIPITOR Take 10 mg by mouth daily.   bismuth subsalicylate 811 BJ/47WG suspension Commonly known as:  PEPTO BISMOL Take 30 mLs by mouth as needed for diarrhea or loose stools.   carvedilol 6.25 MG tablet Commonly known as:  COREG Take 6.25 mg by mouth 2 (two) times daily with a meal.   cholecalciferol  1000 units tablet Commonly known as:  VITAMIN D Take 1,000 Units by mouth daily.   DELSYM 30  MG/5ML liquid Generic drug:  dextromethorphan Take 10 mLs by mouth daily as needed for cough.   donepezil 10 MG tablet Commonly known as:  ARICEPT Take 10 mg by mouth at bedtime.   fluticasone furoate-vilanterol 100-25 MCG/INH Aepb Commonly known as:  BREO ELLIPTA Inhale 1 puff into the lungs daily with breakfast.   furosemide 40 MG tablet Commonly known as:  LASIX Take 1 tablet (40 mg total) by mouth daily.   guaiFENesin 600 MG 12 hr tablet Commonly known as:  MUCINEX Take 1 tablet (600 mg total) by mouth 2 (two) times daily.   levofloxacin 750 MG tablet Commonly known as:  LEVAQUIN Take 1 tablet (750 mg total) by mouth every other day. First dose on 06/18/2016 Start taking on:  06/18/2016   loperamide 2 MG capsule Commonly known as:  IMODIUM Take 1 capsule (2 mg total) by mouth 4 (four) times daily as needed for diarrhea or loose stools.   losartan 25 MG tablet Commonly known as:  COZAAR Take 25 mg by mouth daily.   mirabegron ER 50 MG Tb24 tablet Commonly known as:  MYRBETRIQ Take 50 mg by mouth daily with breakfast.   nitroGLYCERIN 0.3 MG SL tablet Commonly known as:  NITROSTAT Place 0.3 mg under the tongue every 5 (five) minutes as needed for chest pain.   ORAJEL MOUTH-AID 20 % Gel Generic drug:  benzocaine Use as directed 1 application in the mouth or throat 4 (four) times daily as needed (gum pain).   phenytoin 100 MG ER capsule Commonly known as:  DILANTIN Take 200 mg by mouth 2 (two) times daily.   PREPARATION H HYDROCORTISONE 1 % Generic drug:  hydrocortisone cream Apply 1 application topically 2 (two) times daily as needed. For hemmoroids.   spironolactone 25 MG tablet Commonly known as:  ALDACTONE Take 25 mg by mouth every morning.            Durable Medical Equipment        Start     Ordered   06/16/16 [phone]  DME Oxygen  Once    Question Answer Comment  Mode or (Route) Nasal cannula   Liters per Minute 3   Frequency Continuous  (stationary and portable oxygen unit needed)   Oxygen conserving device Yes   Oxygen delivery system Gas      06/16/16 0959        Diet and Activity recommendation: See Discharge Instructions above   Consults obtained -  None   Major procedures and Radiology Reports - PLEASE review detailed and final reports for all details, in brief -      Dg Chest 2 View  Result Date: 05/17/2016 CLINICAL DATA:  Dyspnea x3 days with cough.  No fever. EXAM: CHEST  2 VIEW COMPARISON:  12/04/2015 CXR FINDINGS: Stable cardiomegaly with tortuous thoracic aorta. Mild interstitial edema. Faint subpleural areas of interstitial fibrosis bilaterally especially at the lung bases. Trace edema along the minor fissure with evolving atelectatic change. Old right-sided rib fractures. No acute nor suspicious osseous abnormality. No pneumothorax or effusion. IMPRESSION: Cardiomegaly with mild interstitial edema since prior exam. Trace edema/fluid in the minor fissure with adjacent area bandlike atelectasis. Electronically Signed   By: Ashley Royalty M.D.   On: 05/17/2016 17:50   Dg Chest Port 1 View  Result Date: 06/14/2016 CLINICAL DATA:  Pneumonia EXAM: PORTABLE CHEST 1 VIEW COMPARISON:  June 13, 2016 and May 17, 2016 FINDINGS: There is underlying interstitial fibrosis. There has been interval partial clearing of airspace opacity in the right mid lung peripherally. There are bilateral pleural effusions. There is cardiomegaly with pulmonary venous hypertension. No evident adenopathy. Bones appear somewhat osteoporotic. There is atherosclerotic calcification in the aorta. IMPRESSION: Interval partial clearing of airspace opacity from the right mid lung peripherally. Patchy opacity remains in this area, likely incomplete clearing of pneumonia in this area. Elsewhere, the interstitium is diffusely prominent which most likely is due to a combination of interstitial pulmonary edema and underlying fibrosis. There is  cardiomegaly with pulmonary venous hypertension and bilateral pleural effusions. There is felt to be a degree of congestive heart failure. There is aortic atherosclerosis. No new opacity is evident compared to 1 day prior ; there appears to be slightly less interstitial edema compared to 1 day prior. Electronically Signed   By: Lowella Grip III M.D.   On: 06/14/2016 07:16   Dg Chest Port 1 View  Result Date: 06/13/2016 CLINICAL DATA:  Increasing shortness of breath, hypoxia beginning at midnight. History of COPD. EXAM: PORTABLE CHEST 1 VIEW COMPARISON:  Chest radiograph May 17, 2016 FINDINGS: Cardiac silhouette is moderately enlarged. Tortuous calcified aorta. Diffuse interstitial prominence, patchy bibasilar and RIGHT mid lung zone airspace opacities and small pleural effusions. No pneumothorax. Soft tissue planes and included osseous structures are nonsuspicious. IMPRESSION: Severe interstitial prominence concerning for atypical infection, with bibasilar and RIGHT mid lung zone atelectasis versus focal consolidation/pneumonia. Small pleural effusions. Stable cardiomegaly and COPD. Electronically Signed   By: Elon Alas M.D.   On: 06/13/2016 05:18    Micro Results     Recent Results (from the past 240 hour(s))  Culture, blood (routine x 2) Call MD if unable to obtain prior to antibiotics being given     Status: Abnormal   Collection Time: 06/13/16  5:54 AM  Result Value Ref Range Status   Specimen Description BLOOD LEFT HAND  Final   Special Requests IN PEDIATRIC BOTTLE 3CC  Final   Culture  Setup Time   Final    Organism ID to follow GRAM POSITIVE RODS IN PEDIATRIC BOTTLE CRITICAL RESULT CALLED TO, READ BACK BY AND VERIFIED WITH: N. Glogovac Pharm.D. 13:05 06/14/13 ([email or web address])    Culture (A)  Final    DIPHTHEROIDS(CORYNEBACTERIUM SPECIES) Standardized susceptibility testing for this organism is not available. Performed at [facility], [place] [address], [place],  [place] [address]    Report Status 06/15/2016 FINAL  Final  Blood Culture ID Panel (Reflexed)     Status: None   Collection Time: 06/13/16  5:54 AM  Result Value Ref Range Status   Enterococcus species NOT DETECTED NOT DETECTED Final   Listeria monocytogenes NOT DETECTED NOT DETECTED Final   Staphylococcus species NOT DETECTED NOT DETECTED Final   Staphylococcus aureus NOT DETECTED NOT DETECTED Final   Streptococcus species NOT DETECTED NOT DETECTED Final   Streptococcus agalactiae NOT DETECTED NOT DETECTED Final   Streptococcus pneumoniae NOT DETECTED NOT DETECTED Final   Streptococcus pyogenes NOT DETECTED NOT DETECTED Final   Acinetobacter baumannii NOT DETECTED NOT DETECTED Final   Enterobacteriaceae species NOT DETECTED NOT DETECTED Final   Enterobacter cloacae complex NOT DETECTED NOT DETECTED Final   Escherichia coli NOT DETECTED NOT DETECTED Final   Klebsiella oxytoca NOT DETECTED NOT DETECTED Final   Klebsiella pneumoniae NOT DETECTED NOT DETECTED Final   Proteus species NOT DETECTED NOT DETECTED Final   Serratia  marcescens NOT DETECTED NOT DETECTED Final   Haemophilus influenzae NOT DETECTED NOT DETECTED Final   Neisseria meningitidis NOT DETECTED NOT DETECTED Final   Pseudomonas aeruginosa NOT DETECTED NOT DETECTED Final   Candida albicans NOT DETECTED NOT DETECTED Final   Candida glabrata NOT DETECTED NOT DETECTED Final   Candida krusei NOT DETECTED NOT DETECTED Final   Candida parapsilosis NOT DETECTED NOT DETECTED Final   Candida tropicalis NOT DETECTED NOT DETECTED Final    Comment: Performed at [facility], [place] [address] [place], [place] [address]  Culture, blood (routine x 2) Call MD if unable to obtain prior to antibiotics being given     Status: None (Preliminary result)   Collection Time: 06/13/16  6:15 AM  Result Value Ref Range Status   Specimen Description BLOOD LEFT HAND  Final   Special Requests IN PEDIATRIC BOTTLE 3CC  Final   Culture   Final    NO  GROWTH 2 DAYS Performed at [facility], [place] [address], [place], [place] [address]    Report Status PENDING  Incomplete  Culture, sputum-assessment     Status: None   Collection Time: 06/13/16  7:10 AM  Result Value Ref Range Status   Specimen Description SPUTUM  Final   Special Requests NONE  Final   Sputum evaluation   Final    Sputum specimen not acceptable for testing.  Please recollect.   INFORMED Glena Norfolk RN @0908  ON 3.13.18 BY NMCCOY    Report Status 06/13/2016 FINAL  Final  MRSA PCR Screening     Status: None   Collection Time: 06/13/16  9:30 AM  Result Value Ref Range Status   MRSA by PCR NEGATIVE NEGATIVE Final    Comment:        The GeneXpert MRSA Assay (FDA approved for NASAL specimens only), is one component of a comprehensive MRSA colonization surveillance program. It is not intended to diagnose MRSA infection nor to guide or monitor treatment for MRSA infections.   Culture, sputum-assessment     Status: None   Collection Time: 06/13/16  8:03 PM  Result Value Ref Range Status   Specimen Description SPU  Final   Special Requests Normal  Final   Sputum evaluation   Final    Sputum specimen not acceptable for testing.  Please recollect.     Report Status 06/13/2016 FINAL  Final  Culture, Urine     Status: None   Collection Time: 06/14/16  1:54 PM  Result Value Ref Range Status   Specimen Description URINE, RANDOM  Final   Special Requests NONE  Final   Culture   Final    NO GROWTH Performed at [facility], [address], [place],  [address]    Report Status 06/15/2016 FINAL  Final       Today   Subjective:   Russ Looper today has no headache,no chest or abdominal pain, feels much better  today. Reports cough significantly improved, as well as productive today.  Objective:   Blood pressure 116/64, pulse 74, temperature 97.8 F (36.6 C), temperature source Oral, resp. rate 17, height 5\' 10"  (1.778 m), weight 68 kg (150  lb), SpO2 98 %.   Intake/Output Summary (Last 24 hours) at 06/16/16 1145 Last data filed at 06/16/16 1100  Gross per 24 hour  Intake              360 ml  Output  1885 ml  Net            -1525 ml    Exam Awake Alert, In no apparent distress Supple Neck,No JVD,  Symmetrical Chest wall movement, Good air movement bilaterally, CTAB RRR,No Gallops,Rubs or new Murmurs, No Parasternal Heave +ve B.Sounds, Abd Soft, Non tender, No rebound -guarding or rigidity. No Cyanosis, Clubbing or edema, No new Rash or bruise  Data Review   CBC w Diff:  Lab Results  Component Value Date   WBC 7.3 06/14/2016   HGB 11.6 (L) 06/14/2016   HCT 35.3 (L) 06/14/2016   PLT 211 06/14/2016   LYMPHOPCT 8 06/13/2016   MONOPCT 8 06/13/2016   EOSPCT 4 06/13/2016   BASOPCT 1 06/13/2016    CMP:  Lab Results  Component Value Date   NA 137 06/15/2016   K 3.7 06/15/2016   CL 108 06/15/2016   CO2 23 06/15/2016   BUN 16 06/15/2016   CREATININE 1.17 06/15/2016   PROT 7.4 06/13/2016   ALBUMIN 4.0 06/13/2016   BILITOT 0.7 06/13/2016   ALKPHOS 85 06/13/2016   AST 29 06/13/2016   ALT 20 06/13/2016  .   Total Time in preparing paper work, data evaluation and todays exam - 35 minutes  Mariajose Mow M.D on 06/16/2016 at [place]  [phone]

## 2016-06-16 NOTE — Clinical Social Work Note (Signed)
CSW received phone call from patient's relative [name]. [name] was very upset stating that the patient was at the facility [facility]) and that the patient's O2 had not been delivered to the facility. Per [name], EMS is currently holding the patient outside of the facility as the facility will not accept the patient until O2 has been setup with the facility. [name] states she is very upset because she recalls talking to CSW hours earlier and CSW explained that our CM department would arrange home O2 for the patient and once this was in place CSW would request transport.   CSW contacted RNCM to determine who arranged for transport. RNCM stated that she had made these arrangements. CSW explained that CSW's arrange EMS transports to facilities. CSW contacted RN who stated that she was told by [facility] that everything was arranged for the patient to go to ALF. CSW sent all necessary clinical documentation to facility (FL2, DC Summary, HH orders) this morning and was told by facility staff [facility] and [facility] [place]) that facility would call once home O2 was delivered so that transport could be arranged. CSW will notify ED social worker of patient's possible return to the ED if EMS will not wait for the patient's O2 to arrive.    Liz Beach MSW, [place], [employer], [phone]

## 2016-06-16 NOTE — ED Provider Notes (Signed)
[facility] DEPT Provider Note   CSN: [id number] Arrival date & time: 06/16/16  1501     History   Chief Complaint Chief Complaint  Patient presents with  . Boarder  . Oxygen Dependent    HPI [name] is a 83 y.o. male.  83 yo M with a chief complaint of needing oxygen. Patient is on 3 L of oxygen at all times. He was recently discharged from the hospital but on arrival to his assisted living facility there is no oxygen available. He was then sent back to the hospital. Patient has no other complaints.   The history is provided by the patient and the nursing home.  Illness  This is a new problem. The current episode started 2 days ago. The problem occurs constantly. The problem has not changed since onset.Pertinent negatives include no chest pain, no abdominal pain, no headaches and no shortness of breath. Nothing aggravates the symptoms. Nothing relieves the symptoms. He has tried nothing for the symptoms. The treatment provided no relief.    Past Medical History:  Diagnosis Date  . Acute MI Dec 2010   cardiac catheriziation and stenting of the LAD (bare metal) 03/14/09              . Anemia   . Angina   . Blood transfusion   . CAD (coronary artery disease)   . COPD (chronic obstructive pulmonary disease) ([place])   . DEMENTIA   . High cholesterol   . Hypertension   . LV dysfunction    EF 25 to 30% per echo May 2012  . NSTEMI (non-ST elevated myocardial infarction) [facility]) Nov 2011   with LV dysfunction  . Prostatic hypertrophy   . Seizures ([place]) ~ 2006   "when blood pressure went up too high"; on Dilantin  . Urethral stricture    with subsequent trauma and has an indwelling Foley catheter in place during his first MI                                                 . Urinary catheter in place    chronic  . Vision impairment    "legally blind"    Patient Active Problem List   Diagnosis Date Noted  . Chronic CHF ([place]) 06/13/2016  . HCAP  (healthcare-associated pneumonia) 06/13/2016  . Bilateral leg edema   . Hypervolemia   . Dementia 05/18/2016  . Hypoxia 12/03/2015  . Acute on chronic combined systolic and diastolic CHF (congestive heart failure) ([place]) 12/03/2015  . Rectal bleeding 07/01/2015  . UTI (lower urinary tract infection) 03/07/2012  . Lower GI bleed = suspect diverticular 03/06/2012  . Chest pain, unspecified 05/11/2011  . Preop cardiovascular exam 12/07/2010  . CAD (coronary artery disease) 08/24/2010  . Hypertension   . Seizure disorder ([place])   . Anemia   . LV dysfunction   . NSTEMI (non-ST elevated myocardial infarction) [facility])     Past Surgical History:  Procedure Laterality Date  . CARDIAC CATHETERIZATION  02/2010  . 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 CHOLECYSTECTOMY  05/30/11  . CHOLECYSTECTOMY  05/30/2011   Procedure: LAPAROSCOPIC CHOLECYSTECTOMY;  Surgeon: Judieth Keens, DO;  Location: [facility];  Service: General;  Laterality: N/A;  . CORONARY ANGIOPLASTY WITH STENT PLACEMENT  03/2009   "  1"; stent to LAD  . TONSILLECTOMY     "when I was small"       Home Medications    Prior to Admission medications   Medication Sig Start Date End Date Taking? Authorizing Provider  acetaminophen (TYLENOL) 500 MG tablet Take 1,000 mg by mouth every 8 (eight) hours as needed for moderate pain.   Yes Historical Provider, MD  aspirin 81 MG chewable tablet Chew 81 mg by mouth daily.   Yes Historical Provider, MD  atorvastatin (LIPITOR) 10 MG tablet Take 10 mg by mouth daily.   Yes Historical Provider, MD  benzocaine (ORAJEL MOUTH-AID) 20 % GEL Use as directed 1 application in the mouth or throat 4 (four) times daily as needed (gum pain).   Yes Historical Provider, MD  bismuth subsalicylate (PEPTO BISMOL) 262 MG/15ML suspension Take 30 mLs by mouth as needed for diarrhea or loose stools.    Yes Historical Provider, MD  carvedilol (COREG) 6.25 MG tablet Take 6.25 mg  by mouth 2 (two) times daily with a meal.   Yes Historical Provider, MD  cholecalciferol (VITAMIN D) 1000 UNITS tablet Take 1,000 Units by mouth daily.   Yes Historical Provider, MD  dextromethorphan (DELSYM) 30 MG/5ML liquid Take 10 mLs by mouth daily as needed for cough.    Yes Historical Provider, MD  donepezil (ARICEPT) 10 MG tablet Take 10 mg by mouth at bedtime.   Yes Historical Provider, MD  Fluticasone Furoate-Vilanterol 100-25 MCG/INH AEPB Inhale 1 puff into the lungs daily with breakfast.   Yes Historical Provider, MD  furosemide (LASIX) 40 MG tablet Take 1 tablet (40 mg total) by mouth daily. 12/06/15  Yes Albertine Patricia, MD  hydrocortisone cream (PREPARATION H HYDROCORTISONE) 1 % Apply 1 application topically 2 (two) times daily as needed. For hemmoroids.   Yes Historical Provider, MD  loperamide (IMODIUM) 2 MG capsule Take 1 capsule (2 mg total) by mouth 4 (four) times daily as needed for diarrhea or loose stools. 02/28/16  Yes Orpah Greek, MD  losartan (COZAAR) 25 MG tablet Take 25 mg by mouth daily.   Yes Historical Provider, MD  mirabegron ER (MYRBETRIQ) 50 MG TB24 tablet Take 50 mg by mouth daily with breakfast.   Yes Historical Provider, MD  nitroGLYCERIN (NITROSTAT) 0.3 MG SL tablet Place 0.3 mg under the tongue every 5 (five) minutes as needed for chest pain.   Yes Historical Provider, MD  phenytoin (DILANTIN) 100 MG ER capsule Take 200 mg by mouth 2 (two) times daily.   Yes Historical Provider, MD  spironolactone (ALDACTONE) 25 MG tablet Take 25 mg by mouth every morning.   Yes Historical Provider, MD  guaiFENesin (MUCINEX) 600 MG 12 hr tablet Take 1 tablet (600 mg total) by mouth 2 (two) times daily. 06/16/16   Silver Huguenin Elgergawy, MD  levofloxacin (LEVAQUIN) 750 MG tablet Take 1 tablet (750 mg total) by mouth every other day. First dose on 06/18/2016 06/18/16 06/20/16  Albertine Patricia, MD    Family History Family History  Problem Relation Age of Onset  . Colon  cancer Neg Hx     Social History Social History  Substance Use Topics  . Smoking status: Former Smoker    Packs/day: 0.00    Years: 50.00    Types: Cigarettes    Quit date: 08/22/2004  . Smokeless tobacco: Never Used  . Alcohol use No     Comment: "quit drinking 1980's"     Allergies   Red dye and  Iodinated diagnostic agents   Review of Systems Review of Systems  Constitutional: Negative for chills and fever.  HENT: Negative for congestion and facial swelling.   Eyes: Negative for discharge and visual disturbance.  Respiratory: Negative for shortness of breath.   Cardiovascular: Negative for chest pain and palpitations.  Gastrointestinal: Negative for abdominal pain, diarrhea and vomiting.  Musculoskeletal: Negative for arthralgias and myalgias.  Skin: Negative for color change and rash.  Neurological: Negative for tremors, syncope and headaches.  Psychiatric/Behavioral: Negative for confusion and dysphoric mood.     Physical Exam Updated Vital Signs BP 119/72 (BP Location: Right Arm)   Pulse 74   Temp 97.8 F (36.6 C) (Oral)   Resp 16   SpO2 97%   Physical Exam  Constitutional: He is oriented to person, place, and time. He appears well-developed and well-nourished.  HENT:  Head: Normocephalic and atraumatic.  Eyes: EOM are normal. Pupils are equal, round, and reactive to light.  Neck: Normal range of motion. Neck supple. No JVD present.  Cardiovascular: Normal rate and regular rhythm.  Exam reveals no gallop and no friction rub.   No murmur heard. Pulmonary/Chest: No respiratory distress. He has no wheezes.  Abdominal: He exhibits no distension. There is no rebound and no guarding.  Musculoskeletal: Normal range of motion.  Neurological: He is alert and oriented to person, place, and time.  Skin: No rash noted. No pallor.  Psychiatric: He has a normal mood and affect. His behavior is normal.  Nursing note and vitals reviewed.    ED Treatments / Results    Labs (all labs ordered are listed, but only abnormal results are displayed) Labs Reviewed - No data to display  EKG  EKG Interpretation None       Radiology No results found.  Procedures Procedures (including critical care time)  Medications Ordered in ED Medications - No data to display   Initial Impression / Assessment and Plan / ED Course  I have reviewed the triage vital signs and the nursing notes.  Pertinent labs & imaging results that were available during my care of the patient were reviewed by me and considered in my medical decision making (see chart for details).     83 yo M He needs oxygen at home. Social work is working on this issue and will notify me when he is able to be discharged.    I have discussed the diagnosis/risks/treatment options with the patient and believe the pt to be eligible for discharge home to follow-up with PCP. We also discussed returning to the ED immediately if new or worsening sx occur. We discussed the sx which are most concerning (e.g., sudden worsening pain, fever, inability to tolerate by mouth) that necessitate immediate return. Medications administered to the patient during their visit and any new prescriptions provided to the patient are listed below.  Medications given during this visit Medications - No data to display   The patient appears reasonably screen and/or stabilized for discharge and I doubt any other medical condition or other [facility] requiring further screening, evaluation, or treatment in the ED at this time prior to discharge.    Final Clinical Impressions(s) / ED Diagnoses   Final diagnoses:  Oxygen dependent    New Prescriptions Discharge Medication List as of 06/16/2016  5:27 PM       Deno Etienne, DO 06/16/16 2327

## 2016-06-16 NOTE — Progress Notes (Signed)
CSW received a request from pt's RN at approx 3:30pm requesting someone speak to the family about pt's admittance to pt's ALfF and pt's re-admittance to the [facility] ED.  CSW spoke to [facility] and requested CM speak to family regarding pt's admission to ALF and pt's CM spoke to pt's wife.    5:09 PM CSW received call from pt's on-duty CM stating arrangements are being made for pt's acommodations to ALF and CM will contact CSW when pt is ready to transport.   Alphonse Guild. Serine Kea, Latanya Presser, LCAS Clinical Social Worker Ph: [phone]

## 2016-06-16 NOTE — NC FL2 (Signed)
[place] LEVEL OF CARE SCREENING TOOL     IDENTIFICATION  Patient Name: [name] Birthdate: [birth year] Sex: male Admission Date (Current Location): 06/13/2016  [facility] and [place] Number:  Herbalist and Address:  [facility],  [place] [address], [place]      Provider Number: [id number]  Attending Physician Name and Address:  Albertine Patricia, MD  Relative Name and Phone Number:       Current Level of Care: Hospital Recommended Level of Care: [place] Prior Approval Number:    Date Approved/Denied:   PASRR Number:    Discharge Plan: Other (Comment) (ALF)    Current Diagnoses: Patient Active Problem List   Diagnosis Date Noted  . Chronic CHF ([place]) 06/13/2016  . HCAP (healthcare-associated pneumonia) 06/13/2016  . Bilateral leg edema   . Hypervolemia   . Dementia 05/18/2016  . Hypoxia 12/03/2015  . Acute on chronic combined systolic and diastolic CHF (congestive heart failure) ([place]) 12/03/2015  . Rectal bleeding 07/01/2015  . UTI (lower urinary tract infection) 03/07/2012  . Lower GI bleed = suspect diverticular 03/06/2012  . Chest pain, unspecified 05/11/2011  . Preop cardiovascular exam 12/07/2010  . CAD (coronary artery disease) 08/24/2010  . Hypertension   . Seizure disorder ([place])   . Anemia   . LV dysfunction   . NSTEMI (non-ST elevated myocardial infarction) ([place])     Orientation RESPIRATION BLADDER Height & Weight     Time, Situation, Self, Place  O2 (3L) Continent Weight: 68 kg (150 lb) Height:  5\' 10"  (177.8 cm)  BEHAVIORAL SYMPTOMS/MOOD NEUROLOGICAL BOWEL NUTRITION STATUS   (NONE)  (NONE) Continent Diet (Heart Healthy Diet)  AMBULATORY STATUS COMMUNICATION OF NEEDS Skin   Supervision Verbally Normal                       Personal Care Assistance Level of Assistance  Bathing, Feeding, Dressing Bathing Assistance: Independent Feeding assistance: Independent Dressing  Assistance: Independent     Functional Limitations Info  Sight, Speech, Hearing Sight Info: Adequate Hearing Info: Adequate Speech Info: Adequate    SPECIAL CARE FACTORS FREQUENCY  PT (By licensed PT)     PT Frequency: 3/week              Contractures Contractures Info: Not present    Additional Factors Info  Code Status, Allergies, Psychotropic Code Status Info: Full Code Allergies Info: Red Dye, Iodinated Diagnostic Agents Psychotropic Info: Aricept         Current Medications (06/16/2016):  This is the current hospital active medication list Current Facility-Administered Medications  Medication Dose Route Frequency Provider Last Rate Last Dose  . aspirin chewable tablet 81 mg  81 mg Oral Daily Albertine Patricia, MD   81 mg at 06/15/16 1007  . atorvastatin (LIPITOR) tablet 10 mg  10 mg Oral Daily Albertine Patricia, MD   10 mg at 06/15/16 1007  . bismuth subsalicylate (PEPTO BISMOL) 262 MG/15ML suspension 30 mL  30 mL Oral PRN Albertine Patricia, MD   30 mL at 06/15/16 0007  . carvedilol (COREG) tablet 6.25 mg  6.25 mg Oral BID WC Albertine Patricia, MD   6.25 mg at 06/16/16 0802  . ceFEPIme (MAXIPIME) 1 g in dextrose 5 % 50 mL IVPB  1 g Intravenous BID Hannah Muthersbaugh, PA-C   Stopped at 06/15/16 2100  . cholecalciferol (VITAMIN D) tablet 1,000 Units  1,000 Units Oral Daily Dawood  Graciela Husbands, MD   1,000 Units at 06/15/16 1008  . dextromethorphan (DELSYM) 30 MG/5ML liquid 60 mg  10 mL Oral Daily PRN Albertine Patricia, MD   60 mg at 06/15/16 0153  . donepezil (ARICEPT) tablet 10 mg  10 mg Oral QHS Albertine Patricia, MD   10 mg at 06/15/16 2223  . fluticasone furoate-vilanterol (BREO ELLIPTA) 100-25 MCG/INH 1 puff  1 puff Inhalation Q breakfast Albertine Patricia, MD   1 puff at 06/16/16 0848  . furosemide (LASIX) tablet 40 mg  40 mg Oral Daily Albertine Patricia, MD   40 mg at 06/15/16 1007  . heparin injection 5,000 Units  5,000 Units Subcutaneous Q8H Albertine Patricia, MD   5,000 Units at 06/16/16 [phone]  . losartan (COZAAR) tablet 25 mg  25 mg Oral Daily Albertine Patricia, MD   25 mg at 06/15/16 1008  . mirabegron ER (MYRBETRIQ) tablet 50 mg  50 mg Oral Q breakfast Albertine Patricia, MD   50 mg at 06/16/16 0802  . nitroGLYCERIN (NITROSTAT) SL tablet 0.4 mg  0.4 mg Sublingual Q5 min PRN Albertine Patricia, MD      . phenytoin (DILANTIN) ER capsule 200 mg  200 mg Oral BID Albertine Patricia, MD   200 mg at 06/15/16 2223  . spironolactone (ALDACTONE) tablet 25 mg  25 mg Oral q morning - 10a Albertine Patricia, MD   25 mg at 06/15/16 1008     Discharge Medications: Please see discharge summary for a list of discharge medications.  Relevant Imaging Results:  Relevant Lab Results:   Additional Information SSN [id number] Patient to receive HHPT services at ALF (please see order from MD).  Rigoberto Noel, LCSW

## 2016-06-16 NOTE — ED Notes (Signed)
Tammy, RN took a look at patient foley due to some leakage.  Foley draining appropriately.  Tammy believes it to have been a bladder spasm.

## 2016-06-16 NOTE — Discharge Instructions (Signed)
Follow with Primary MD Tivis Ringer, MD in 7 days   Get CBC, CMP, 2 view Chest X ray checked  by Primary MD next visit.    Activity: As tolerated with Full fall precautions use walker/cane & assistance as needed   Disposition SNF   Diet: Heart Healthy   For Heart failure patients - Check your Weight same time everyday, if you gain over 2 pounds, or you develop in leg swelling, experience more shortness of breath or chest pain, call your Primary MD immediately. Follow Cardiac Low Salt Diet and 1.5 lit/day fluid restriction.   On your next visit with your primary care physician please Get Medicines reviewed and adjusted.   Please request your Prim.MD to go over all Hospital Tests and Procedure/Radiological results at the follow up, please get all Hospital records sent to your Prim MD by signing hospital release before you go home.   If you experience worsening of your admission symptoms, develop shortness of breath, life threatening emergency, suicidal or homicidal thoughts you must seek medical attention immediately by calling 911 or calling your MD immediately  if symptoms less severe.  You Must read complete instructions/literature along with all the possible adverse reactions/side effects for all the Medicines you take and that have been prescribed to you. Take any new Medicines after you have completely understood and accpet all the possible adverse reactions/side effects.   Do not drive, operating heavy machinery, perform activities at heights, swimming or participation in water activities or provide baby sitting services if your were admitted for syncope or siezures until you have seen by Primary MD or a Neurologist and advised to do so again.  Do not drive when taking Pain medications.    Do not take more than prescribed Pain, Sleep and Anxiety Medications  Special Instructions: If you have smoked or chewed Tobacco  in the last 2 yrs please stop smoking, stop any regular  Alcohol  and or any Recreational drug use.  Wear Seat belts while driving.   Please note  You were cared for by a hospitalist during your hospital stay. If you have any questions about your discharge medications or the care you received while you were in the hospital after you are discharged, you can call the unit and asked to speak with the hospitalist on call if the hospitalist that took care of you is not available. Once you are discharged, your primary care physician will handle any further medical issues. Please note that NO REFILLS for any discharge medications will be authorized once you are discharged, as it is imperative that you return to your primary care physician (or establish a relationship with a primary care physician if you do not have one) for your aftercare needs so that they can reassess your need for medications and monitor your lab values.

## 2016-06-16 NOTE — Progress Notes (Signed)
[id number]/[facility] notified of need for transport/Sou Nohr,BSN,RN3,CCM

## 2016-06-16 NOTE — Care Management (Signed)
Patient being trasported back to facility [facility] has delivered oxygen to facility. No further ED CM needs identified.

## 2016-06-16 NOTE — ED Triage Notes (Signed)
Pt comes from [facility] assisted living on [address] via [place].  Was discharged from 1508 at 1400.  Facility did not have any oxygen set up for the patient who is dependent on 3L constantly. Pt has a foley cath and PTAR reports it must be leaking because he is wet. Paperwork in Special educational needs teacher at nurses station.  DNR located with them.

## 2016-06-18 LAB — CULTURE, BLOOD (ROUTINE X 2): CULTURE: NO GROWTH

## 2016-06-26 ENCOUNTER — Ambulatory Visit: Payer: Medicare Other | Admitting: Cardiovascular Disease

## 2016-07-16 ENCOUNTER — Encounter (HOSPITAL_COMMUNITY): Payer: Self-pay

## 2016-07-16 ENCOUNTER — Inpatient Hospital Stay (HOSPITAL_COMMUNITY)
Admission: EM | Admit: 2016-07-16 | Discharge: 2016-07-20 | DRG: 291 | Disposition: A | Discharge status: Skilled Nursing Facility | Payer: Medicare Other | Attending: Family Medicine | Admitting: Family Medicine

## 2016-07-16 ENCOUNTER — Emergency Department (HOSPITAL_COMMUNITY): Payer: Medicare Other

## 2016-07-16 DIAGNOSIS — K117 Disturbances of salivary secretion: Secondary | ICD-10-CM | POA: Diagnosis not present

## 2016-07-16 DIAGNOSIS — Z7982 Long term (current) use of aspirin: Secondary | ICD-10-CM

## 2016-07-16 DIAGNOSIS — G40909 Epilepsy, unspecified, not intractable, without status epilepticus: Secondary | ICD-10-CM | POA: Diagnosis present

## 2016-07-16 DIAGNOSIS — J9601 Acute respiratory failure with hypoxia: Secondary | ICD-10-CM | POA: Diagnosis not present

## 2016-07-16 DIAGNOSIS — F419 Anxiety disorder, unspecified: Secondary | ICD-10-CM | POA: Diagnosis present

## 2016-07-16 DIAGNOSIS — Z9981 Dependence on supplemental oxygen: Secondary | ICD-10-CM | POA: Diagnosis not present

## 2016-07-16 DIAGNOSIS — I509 Heart failure, unspecified: Secondary | ICD-10-CM

## 2016-07-16 DIAGNOSIS — Z87891 Personal history of nicotine dependence: Secondary | ICD-10-CM | POA: Diagnosis not present

## 2016-07-16 DIAGNOSIS — Z66 Do not resuscitate: Secondary | ICD-10-CM | POA: Diagnosis present

## 2016-07-16 DIAGNOSIS — Z955 Presence of coronary angioplasty implant and graft: Secondary | ICD-10-CM

## 2016-07-16 DIAGNOSIS — H548 Legal blindness, as defined in USA: Secondary | ICD-10-CM | POA: Diagnosis present

## 2016-07-16 DIAGNOSIS — N359 Urethral stricture, unspecified: Secondary | ICD-10-CM | POA: Diagnosis present

## 2016-07-16 DIAGNOSIS — N390 Urinary tract infection, site not specified: Secondary | ICD-10-CM | POA: Diagnosis present

## 2016-07-16 DIAGNOSIS — I252 Old myocardial infarction: Secondary | ICD-10-CM

## 2016-07-16 DIAGNOSIS — I6522 Occlusion and stenosis of left carotid artery: Secondary | ICD-10-CM

## 2016-07-16 DIAGNOSIS — I679 Cerebrovascular disease, unspecified: Secondary | ICD-10-CM

## 2016-07-16 DIAGNOSIS — T83518A Infection and inflammatory reaction due to other urinary catheter, initial encounter: Secondary | ICD-10-CM | POA: Diagnosis present

## 2016-07-16 DIAGNOSIS — R29810 Facial weakness: Secondary | ICD-10-CM | POA: Diagnosis present

## 2016-07-16 DIAGNOSIS — Y846 Urinary catheterization as the cause of abnormal reaction of the patient, or of later complication, without mention of misadventure at the time of the procedure: Secondary | ICD-10-CM | POA: Diagnosis present

## 2016-07-16 DIAGNOSIS — I255 Ischemic cardiomyopathy: Secondary | ICD-10-CM | POA: Diagnosis present

## 2016-07-16 DIAGNOSIS — Z91041 Radiographic dye allergy status: Secondary | ICD-10-CM

## 2016-07-16 DIAGNOSIS — Z79899 Other long term (current) drug therapy: Secondary | ICD-10-CM

## 2016-07-16 DIAGNOSIS — N400 Enlarged prostate without lower urinary tract symptoms: Secondary | ICD-10-CM | POA: Diagnosis present

## 2016-07-16 DIAGNOSIS — F0390 Unspecified dementia without behavioral disturbance: Secondary | ICD-10-CM | POA: Diagnosis present

## 2016-07-16 DIAGNOSIS — R338 Other retention of urine: Secondary | ICD-10-CM | POA: Diagnosis present

## 2016-07-16 DIAGNOSIS — R1313 Dysphagia, pharyngeal phase: Secondary | ICD-10-CM | POA: Diagnosis present

## 2016-07-16 DIAGNOSIS — I2510 Atherosclerotic heart disease of native coronary artery without angina pectoris: Secondary | ICD-10-CM | POA: Diagnosis present

## 2016-07-16 DIAGNOSIS — J449 Chronic obstructive pulmonary disease, unspecified: Secondary | ICD-10-CM | POA: Diagnosis present

## 2016-07-16 DIAGNOSIS — I10 Essential (primary) hypertension: Secondary | ICD-10-CM | POA: Diagnosis not present

## 2016-07-16 DIAGNOSIS — I110 Hypertensive heart disease with heart failure: Principal | ICD-10-CM | POA: Diagnosis present

## 2016-07-16 DIAGNOSIS — E7800 Pure hypercholesterolemia, unspecified: Secondary | ICD-10-CM | POA: Diagnosis present

## 2016-07-16 DIAGNOSIS — J9621 Acute and chronic respiratory failure with hypoxia: Secondary | ICD-10-CM | POA: Diagnosis present

## 2016-07-16 DIAGNOSIS — J811 Chronic pulmonary edema: Secondary | ICD-10-CM

## 2016-07-16 DIAGNOSIS — R0602 Shortness of breath: Secondary | ICD-10-CM | POA: Diagnosis not present

## 2016-07-16 DIAGNOSIS — I5043 Acute on chronic combined systolic (congestive) and diastolic (congestive) heart failure: Secondary | ICD-10-CM | POA: Diagnosis present

## 2016-07-16 LAB — COMPREHENSIVE METABOLIC PANEL
ALT: 18 U/L (ref 17–63)
ANION GAP: 7 (ref 5–15)
AST: 20 U/L (ref 15–41)
Albumin: 3.8 g/dL (ref 3.5–5.0)
Alkaline Phosphatase: 66 U/L (ref 38–126)
BUN: 15 mg/dL (ref 6–20)
CO2: 23 mmol/L (ref 22–32)
Calcium: 8.8 mg/dL — ABNORMAL LOW (ref 8.9–10.3)
Chloride: 111 mmol/L (ref 101–111)
Creatinine, Ser: 0.99 mg/dL (ref 0.61–1.24)
GFR calc non Af Amer: >60 mL/min (ref >60)
Glucose, Bld: 95 mg/dL (ref 65–99)
POTASSIUM: 3.6 mmol/L (ref 3.5–5.1)
SODIUM: 141 mmol/L (ref 135–145)
Total Bilirubin: 0.6 mg/dL (ref 0.3–1.2)
Total Protein: 6.5 g/dL (ref 6.5–8.1)

## 2016-07-16 LAB — CBC WITH DIFFERENTIAL/PLATELET
BASOS ABS: 0.1 10*3/uL (ref 0.0–0.1)
BASOS PCT: 1 %
Eosinophils Absolute: 0.2 10*3/uL (ref 0.0–0.7)
Eosinophils Relative: 4 %
HEMATOCRIT: 35.6 % — AB (ref 39.0–52.0)
Hemoglobin: 11.8 g/dL — ABNORMAL LOW (ref 13.0–17.0)
LYMPHS PCT: 14 %
Lymphs Abs: 0.9 10*3/uL (ref 0.7–4.0)
MCH: 30.2 pg (ref 26.0–34.0)
MCHC: 33.1 g/dL (ref 30.0–36.0)
MCV: 91 fL (ref 78.0–100.0)
Monocytes Absolute: 0.8 10*3/uL (ref 0.1–1.0)
Monocytes Relative: 12 %
NEUTROS ABS: 4.6 10*3/uL (ref 1.7–7.7)
Neutrophils Relative %: 69 %
PLATELETS: 198 10*3/uL (ref 150–400)
RBC: 3.91 MIL/uL — AB (ref 4.22–5.81)
RDW: 13.5 % (ref 11.5–15.5)
WBC: 6.6 10*3/uL (ref 4.0–10.5)

## 2016-07-16 LAB — EXPECTORATED SPUTUM ASSESSMENT W REFEX TO RESP CULTURE

## 2016-07-16 LAB — TROPONIN I
TROPONIN I: 0.03 ng/mL — AB (ref <0.03)
TROPONIN I: 0.03 ng/mL — AB (ref <0.03)
TROPONIN I: 0.03 ng/mL — AB (ref <0.03)

## 2016-07-16 LAB — URINALYSIS, ROUTINE W REFLEX MICROSCOPIC
BILIRUBIN URINE: NEGATIVE
Glucose, UA: NEGATIVE mg/dL
Ketones, ur: NEGATIVE mg/dL
Nitrite: NEGATIVE
PH: 5 (ref 5.0–8.0)
Protein, ur: NEGATIVE mg/dL
SPECIFIC GRAVITY, URINE: 1.005 (ref 1.005–1.030)

## 2016-07-16 LAB — BRAIN NATRIURETIC PEPTIDE: B NATRIURETIC PEPTIDE 5: 1572 pg/mL — AB (ref 0.0–100.0)

## 2016-07-16 LAB — MRSA PCR SCREENING: MRSA BY PCR: NEGATIVE

## 2016-07-16 MED ORDER — ACETAMINOPHEN 500 MG PO TABS: 1000.0000 mg | ORAL_TABLET | Freq: Three times a day (TID) | ORAL | Status: DC | PRN

## 2016-07-16 MED ORDER — ASPIRIN 81 MG PO CHEW
81.0000 mg | CHEWABLE_TABLET | Freq: Every day | ORAL | Status: DC
  Administered 2016-07-16 – 2016-07-20 (×5): 81 mg via ORAL
  Filled 2016-07-16 (×5): qty 1

## 2016-07-16 MED ORDER — POTASSIUM CHLORIDE CRYS ER 20 MEQ PO TBCR
40.0000 meq | EXTENDED_RELEASE_TABLET | Freq: Every day | ORAL | Status: DC
  Administered 2016-07-16 – 2016-07-20 (×5): 40 meq via ORAL
  Filled 2016-07-16 (×5): qty 2

## 2016-07-16 MED ORDER — MIRABEGRON ER 50 MG PO TB24
50.0000 mg | ORAL_TABLET | Freq: Every day | ORAL | Status: DC
  Administered 2016-07-17 – 2016-07-20 (×4): 50 mg via ORAL
  Filled 2016-07-16 (×4): qty 1

## 2016-07-16 MED ORDER — FUROSEMIDE 10 MG/ML IJ SOLN
40.0000 mg | Freq: Two times a day (BID) | INTRAMUSCULAR | Status: DC
  Administered 2016-07-16 – 2016-07-18 (×5): 40 mg via INTRAVENOUS
  Filled 2016-07-16 (×5): qty 4

## 2016-07-16 MED ORDER — ONDANSETRON HCL 4 MG/2ML IJ SOLN: 4.0000 mg | Freq: Four times a day (QID) | INTRAMUSCULAR | Status: DC | PRN

## 2016-07-16 MED ORDER — FLUTICASONE FUROATE-VILANTEROL 100-25 MCG/INH IN AEPB
1.0000 | INHALATION_SPRAY | Freq: Every day | RESPIRATORY_TRACT | Status: DC
  Administered 2016-07-17 – 2016-07-20 (×2): 1 via RESPIRATORY_TRACT
  Filled 2016-07-16: qty 28

## 2016-07-16 MED ORDER — FUROSEMIDE 10 MG/ML IJ SOLN
40.0000 mg | Freq: Once | INTRAMUSCULAR | Status: AC
  Administered 2016-07-16: 40 mg via INTRAVENOUS
  Filled 2016-07-16: qty 4

## 2016-07-16 MED ORDER — FUROSEMIDE 10 MG/ML IJ SOLN: 40.0000 mg | Freq: Two times a day (BID) | INTRAMUSCULAR | Status: DC

## 2016-07-16 MED ORDER — ONDANSETRON HCL 4 MG PO TABS: 4.0000 mg | ORAL_TABLET | Freq: Four times a day (QID) | ORAL | Status: DC | PRN

## 2016-07-16 MED ORDER — ATORVASTATIN CALCIUM 10 MG PO TABS
10.0000 mg | ORAL_TABLET | Freq: Every day | ORAL | Status: DC
  Administered 2016-07-16 – 2016-07-20 (×5): 10 mg via ORAL
  Filled 2016-07-16 (×5): qty 1

## 2016-07-16 MED ORDER — GUAIFENESIN ER 600 MG PO TB12
600.0000 mg | ORAL_TABLET | Freq: Two times a day (BID) | ORAL | Status: DC
  Administered 2016-07-16 – 2016-07-20 (×9): 600 mg via ORAL
  Filled 2016-07-16 (×9): qty 1

## 2016-07-16 MED ORDER — ENOXAPARIN SODIUM 40 MG/0.4ML ~~LOC~~ SOLN
40.0000 mg | SUBCUTANEOUS | Status: DC
Start: 2016-07-16 — End: 2016-07-20
  Administered 2016-07-16 – 2016-07-19 (×4): 40 mg via SUBCUTANEOUS
  Filled 2016-07-16 (×4): qty 0.4

## 2016-07-16 MED ORDER — BENZOCAINE 10 % MT GEL
Freq: Four times a day (QID) | OROMUCOSAL | Status: DC | PRN
  Filled 2016-07-16: qty 9.4

## 2016-07-16 MED ORDER — PHENYTOIN SODIUM EXTENDED 100 MG PO CAPS
200.0000 mg | ORAL_CAPSULE | Freq: Two times a day (BID) | ORAL | Status: DC
  Administered 2016-07-16 – 2016-07-20 (×9): 200 mg via ORAL
  Filled 2016-07-16 (×9): qty 2

## 2016-07-16 MED ORDER — SODIUM CHLORIDE 0.9% FLUSH
3.0000 mL | Freq: Two times a day (BID) | INTRAVENOUS | Status: DC
  Administered 2016-07-16 – 2016-07-17 (×3): 3 mL via INTRAVENOUS

## 2016-07-16 MED ORDER — SODIUM CHLORIDE 0.9% FLUSH: 3.0000 mL | INTRAVENOUS | Status: DC | PRN

## 2016-07-16 MED ORDER — SODIUM CHLORIDE 0.9 % IV SOLN: 250.0000 mL | INTRAVENOUS | Status: DC | PRN

## 2016-07-16 MED ORDER — BENZOCAINE 20 % MT GEL: 1.0000 "application " | Freq: Four times a day (QID) | OROMUCOSAL | Status: DC | PRN

## 2016-07-16 MED ORDER — CARVEDILOL 6.25 MG PO TABS
6.2500 mg | ORAL_TABLET | Freq: Two times a day (BID) | ORAL | Status: DC
  Administered 2016-07-16 – 2016-07-20 (×8): 6.25 mg via ORAL
  Filled 2016-07-16 (×9): qty 1

## 2016-07-16 MED ORDER — LOSARTAN POTASSIUM 50 MG PO TABS
25.0000 mg | ORAL_TABLET | Freq: Every day | ORAL | Status: DC
  Administered 2016-07-16 – 2016-07-20 (×5): 25 mg via ORAL
  Filled 2016-07-16 (×5): qty 1

## 2016-07-16 MED ORDER — DONEPEZIL HCL 10 MG PO TABS
10.0000 mg | ORAL_TABLET | Freq: Every day | ORAL | Status: DC
  Administered 2016-07-16 – 2016-07-19 (×4): 10 mg via ORAL
  Filled 2016-07-16 (×4): qty 1

## 2016-07-16 MED ORDER — ALBUTEROL SULFATE (2.5 MG/3ML) 0.083% IN NEBU
5.0000 mg | INHALATION_SOLUTION | Freq: Once | RESPIRATORY_TRACT | Status: DC
  Filled 2016-07-16: qty 6

## 2016-07-16 MED ORDER — VITAMIN D 1000 UNITS PO TABS
1000.0000 [IU] | ORAL_TABLET | Freq: Every day | ORAL | Status: DC
  Administered 2016-07-16 – 2016-07-20 (×5): 1000 [IU] via ORAL
  Filled 2016-07-16 (×5): qty 1

## 2016-07-16 MED ORDER — SPIRONOLACTONE 25 MG PO TABS
25.0000 mg | ORAL_TABLET | Freq: Every morning | ORAL | Status: DC
  Administered 2016-07-16 – 2016-07-20 (×5): 25 mg via ORAL
  Filled 2016-07-16 (×5): qty 1

## 2016-07-16 MED ORDER — ALBUTEROL SULFATE (2.5 MG/3ML) 0.083% IN NEBU
2.5000 mg | INHALATION_SOLUTION | Freq: Four times a day (QID) | RESPIRATORY_TRACT | Status: DC | PRN
Start: 2016-07-16 — End: 2016-07-20

## 2016-07-16 MED ORDER — SODIUM CHLORIDE 0.9% FLUSH
3.0000 mL | Freq: Two times a day (BID) | INTRAVENOUS | Status: DC
  Administered 2016-07-16 – 2016-07-20 (×6): 3 mL via INTRAVENOUS

## 2016-07-16 MED ORDER — NITROGLYCERIN 0.3 MG SL SUBL
0.3000 mg | SUBLINGUAL_TABLET | SUBLINGUAL | Status: DC | PRN
  Filled 2016-07-16: qty 100

## 2016-07-16 NOTE — H&P (Signed)
History and Physical    [name] CVE:[id number] DOB: [birth year] DOA: 07/16/2016  PCP: Tivis Ringer, MD Patient coming from: ALF  I have personally briefly reviewed patient's old medical records in [place]  Chief Complaint: SOB.   HPI: [name] is a 83 y.o. 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 He presents today with increase SOB, productive cough and increase oxygen requirement. He denies chest pain.  Per POA, patient has not been following instructions from Doctors, medications. , ALF has not been able to take care of him lately.   ED Course: patient found to by hypoxic, requiring 6 L of Oxygen. Chest x ray with Bilateral pulmonary edema, BNP: 1572, troponin 0.03, hb 11,   Review of Systems: As per HPI otherwise 10 point review of systems negative.    Past Medical History:  Diagnosis Date  . Acute MI [facility]) Dec 2010   cardiac catheriziation and stenting of the LAD (bare metal) 03/14/09              . Anemia   . Angina   . Blood transfusion   . CAD (coronary artery disease)   . COPD (chronic obstructive pulmonary disease) ([place])   . DEMENTIA   . High cholesterol   . Hypertension   . LV dysfunction    EF 25 to 30% per echo May 2012  . NSTEMI (non-ST elevated myocardial infarction) [facility]) Nov 2011   with LV dysfunction  . Prostatic hypertrophy   . Seizures ([place]) ~ 2006   "when blood pressure went up too high"; on Dilantin  . Urethral stricture    with subsequent trauma and has an indwelling Foley catheter in place during his first MI                                                 . Urinary catheter in place    chronic  . Vision impairment    "legally blind"    Past Surgical History:  Procedure Laterality Date  . CARDIAC CATHETERIZATION   02/2010  . 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 CHOLECYSTECTOMY  05/30/11  . CHOLECYSTECTOMY  05/30/2011   Procedure: LAPAROSCOPIC CHOLECYSTECTOMY;  Surgeon: Judieth Keens, DO;  Location: [place];  Service: General;  Laterality: N/A;  . CORONARY ANGIOPLASTY WITH STENT PLACEMENT  03/2009   "1"; stent to LAD  . TONSILLECTOMY     "when I was small"     reports that he quit smoking about 11 years ago. His smoking use included Cigarettes. He smoked 0.00 packs per day for 50.00 years. He has never used smokeless tobacco. He reports that he does not drink alcohol or use drugs.  Allergies  Allergen Reactions  . Red Dye Other (See Comments)    Per Mar.  . Iodinated Diagnostic Agents Rash    Pt given 50 mg Benadryl prior to Omnipaque injection, tolerated well.    Family History  Problem Relation Age of Onset  . Colon cancer Neg Hx      Prior to Admission medications   Medication Sig Start Date  End Date Taking? Authorizing Provider  acetaminophen (TYLENOL) 500 MG tablet Take 1,000 mg by mouth every 8 (eight) hours as needed for moderate pain.   Yes Historical Provider, MD  aspirin 81 MG chewable tablet Chew 81 mg by mouth daily.   Yes Historical Provider, MD  atorvastatin (LIPITOR) 10 MG tablet Take 10 mg by mouth daily.   Yes Historical Provider, MD  benzocaine (ORAJEL MOUTH-AID) 20 % GEL Use as directed 1 application in the mouth or throat 4 (four) times daily as needed (gum pain).   Yes Historical Provider, MD  bismuth subsalicylate (PEPTO BISMOL) 262 MG/15ML suspension Take 30 mLs by mouth as needed for diarrhea or loose stools.    Yes Historical Provider, MD  carvedilol (COREG) 6.25 MG tablet Take 6.25 mg by mouth 2 (two) times daily with a meal.   Yes Historical Provider, MD  cholecalciferol (VITAMIN D) 1000 UNITS tablet Take 1,000 Units by mouth daily.   Yes Historical Provider, MD  dextromethorphan (DELSYM) 30 MG/5ML liquid Take 10 mLs by  mouth daily as needed for cough.    Yes Historical Provider, MD  donepezil (ARICEPT) 10 MG tablet Take 10 mg by mouth at bedtime.   Yes Historical Provider, MD  Fluticasone Furoate-Vilanterol 100-25 MCG/INH AEPB Inhale 1 puff into the lungs daily with breakfast.   Yes Historical Provider, MD  furosemide (LASIX) 40 MG tablet Take 1 tablet (40 mg total) by mouth daily. 12/06/15  Yes Albertine Patricia, MD  loperamide (IMODIUM) 2 MG capsule Take 1 capsule (2 mg total) by mouth 4 (four) times daily as needed for diarrhea or loose stools. 02/28/16  Yes Orpah Greek, MD  losartan (COZAAR) 25 MG tablet Take 25 mg by mouth daily.   Yes Historical Provider, MD  mirabegron ER (MYRBETRIQ) 50 MG TB24 tablet Take 50 mg by mouth daily with breakfast.   Yes Historical Provider, MD  nitroGLYCERIN (NITROSTAT) 0.3 MG SL tablet Place 0.3 mg under the tongue every 5 (five) minutes as needed for chest pain.   Yes Historical Provider, MD  phenytoin (DILANTIN) 100 MG ER capsule Take 200 mg by mouth 2 (two) times daily.   Yes Historical Provider, MD  spironolactone (ALDACTONE) 25 MG tablet Take 25 mg by mouth every morning.   Yes Historical Provider, MD  guaiFENesin (MUCINEX) 600 MG 12 hr tablet Take 1 tablet (600 mg total) by mouth 2 (two) times daily. Patient not taking: Reported on 07/16/2016 06/16/16   Silver Huguenin Elgergawy, MD  hydrocortisone cream (PREPARATION H HYDROCORTISONE) 1 % Apply 1 application topically 2 (two) times daily as needed. For hemmoroids.    Historical Provider, MD    Physical Exam: Vitals:   07/16/16 0900 07/16/16 0915 07/16/16 0930 07/16/16 0945  BP: (!) 144/91  123/80   Pulse: 70 74 70 78  Resp:      Temp:      TempSrc:      SpO2: 91% 90% 91% 90%  Weight:      Height:        Constitutional: NAD, calm, comfortable Vitals:   07/16/16 0900 07/16/16 0915 07/16/16 0930 07/16/16 0945  BP: (!) 144/91  123/80   Pulse: 70 74 70 78  Resp:      Temp:      TempSrc:      SpO2: 91% 90%  91% 90%  Weight:      Height:       Eyes: PERRL, lids and conjunctivae normal ENMT: Mucous membranes are moist.  Posterior pharynx clear of any exudate or lesions.Normal dentition.  Neck: normal, supple, no masses, no thyromegaly Respiratory: clear to auscultation bilaterally, no wheezing, no crackles. Normal respiratory effort. No accessory muscle use.  Cardiovascular: Regular rate and rhythm, no murmurs / rubs / gallops. No extremity edema. 2+ pedal pulses. No carotid bruits.  Abdomen: no tenderness, no masses palpated. No hepatosplenomegaly. Bowel sounds positive.  Musculoskeletal: no clubbing / cyanosis. No joint deformity upper and lower extremities. Good ROM, no contractures. Normal muscle tone.  Skin: no rashes, lesions, ulcers. No induration Neurologic: CN 2-12 grossly intact. Sensation intact, DTR normal. Strength 5/5 in all 4.  Psychiatric: Normal judgment and insight. Alert and oriented x 3. Normal mood.     Labs on Admission: I have personally reviewed following labs and imaging studies  CBC:  Recent Labs Lab 07/16/16 0525  WBC 6.6  NEUTROABS 4.6  HGB 11.8*  HCT 35.6*  MCV 91.0  PLT 532   Basic Metabolic Panel:  Recent Labs Lab 07/16/16 0525  NA 141  K 3.6  CL 111  CO2 23  GLUCOSE 95  BUN 15  CREATININE 0.99  CALCIUM 8.8*   GFR: Estimated Creatinine Clearance: 56.3 mL/min (by C-G formula based on SCr of 0.99 mg/dL). Liver Function Tests:  Recent Labs Lab 07/16/16 0525  AST 20  ALT 18  ALKPHOS 66  BILITOT 0.6  PROT 6.5  ALBUMIN 3.8   No results for input(s): LIPASE, AMYLASE in the last 168 hours. No results for input(s): AMMONIA in the last 168 hours. Coagulation Profile: No results for input(s): INR, PROTIME in the last 168 hours. Cardiac Enzymes:  Recent Labs Lab 07/16/16 0525  TROPONINI 0.03*   BNP (last 3 results) No results for input(s): PROBNP in the last 8760 hours. HbA1C: No results for input(s): HGBA1C in the last 72  hours. CBG: No results for input(s): GLUCAP in the last 168 hours. Lipid Profile: No results for input(s): CHOL, HDL, LDLCALC, TRIG, CHOLHDL, LDLDIRECT in the last 72 hours. Thyroid Function Tests: No results for input(s): TSH, T4TOTAL, FREET4, T3FREE, THYROIDAB in the last 72 hours. Anemia Panel: No results for input(s): VITAMINB12, FOLATE, FERRITIN, TIBC, IRON, RETICCTPCT in the last 72 hours. Urine analysis:    Component Value Date/Time   COLORURINE YELLOW 06/14/2016 1354   APPEARANCEUR HAZY (A) 06/14/2016 1354   LABSPEC 1.021 06/14/2016 1354   PHURINE 5.0 06/14/2016 1354   GLUCOSEU NEGATIVE 06/14/2016 1354   HGBUR SMALL (A) 06/14/2016 1354   BILIRUBINUR NEGATIVE 06/14/2016 1354   KETONESUR 5 (A) 06/14/2016 1354   PROTEINUR NEGATIVE 06/14/2016 1354   UROBILINOGEN 0.2 08/08/2014 1109   NITRITE NEGATIVE 06/14/2016 1354   LEUKOCYTESUR LARGE (A) 06/14/2016 1354    Radiological Exams on Admission: Dg Chest 2 View  Result Date: 07/16/2016 CLINICAL DATA:  Acute onset of shortness of breath. Initial encounter. EXAM: CHEST  2 VIEW COMPARISON:  Chest radiograph performed 06/14/2016 FINDINGS: The lungs are well-aerated. Small bilateral pleural effusions noted. Patchy bibasilar airspace opacities may reflect pulmonary edema or pneumonia. Underlying vascular congestion is seen. No pneumothorax is identified. The heart is enlarged.  No acute osseous abnormalities are seen. IMPRESSION: Small bilateral pleural effusions. Underlying vascular congestion and cardiomegaly. Patchy bibasilar airspace opacities may reflect pulmonary edema or pneumonia. Electronically Signed   By: Garald Balding M.D.   On: 07/16/2016 06:02    EKG: Independently reviewed. Sinus, PVC  Assessment/Plan Active Problems:   Hypertension   Seizure disorder (HCC)   CAD (coronary artery disease)  Acute on chronic combined systolic and diastolic CHF (congestive heart failure) (HCC)   Dementia   Heart failure (HCC)   Acute  on chronic respiratory failure with hypoxemia (HCC)  1-Acute hypoxic respiratory failure;  Likely related to CHF exacerbation. Chest x ray with patchy bilateral airspace diseases, Pulmonary edema vs PNA. Elevated BNP at 1500. EF 15-20 % by ECHO; 05-2016 Admit to step down unit./  IV lasix 40 mg IV BID> Nebulizer treatment PRN.  Strict I and O.  Patient without leukocytosis, afebrile. If spike fever will need to start IV antibiotics.  Will order Swallow evaluation. Sputum culture.   2-Acute combine HF exacerbation;   IV lasix 40 mg IV BID.  Continue with ACE, spironolactone and coreg.  Monitor renal function.  Cycle cardiac enzymes.   3-Dementia; continue Aricept.   4-COPD; nebulizer Tx.  5-Seizure disorder; Continue with Dilantin.  6--Chronic foley catheter.       DVT prophylaxis: Lovenox Code Status: DNR Family Communication: POA Disposition Plan: Will need SNF at discharge, expect discharge in 2 to 3 days.  Consults called: none Admission status: step down, inpatient/    Niel Hummer A MD [name] Pager [phone]  If 7PM-7AM, please contact night-coverage [email or web address] Password [email or web address]  07/16/2016, 10:12 AM

## 2016-07-16 NOTE — Clinical Social Work Note (Signed)
Clinical Social Work Assessment  Patient Details  Name: [name] MRN: [id number] Date of Birth: January 05, 1933  Date of referral:  07/16/16               Reason for consult:  Facility Placement                Permission sought to share information with:  Facility Sport and exercise psychologist, Family Supports Permission granted to share information::  Yes, Verbal Permission Granted  Name::     Jorge Mandril  Agency::     Relationship::  Step-daughter [employer] Information:  [phone]  Housing/Transportation Living arrangements for the past 2 months:  ([place]) Source of Information:  Patient, Power of Forensic psychologist (Step Daughter [employer]) Patient Interpreter Needed:  None Criminal Activity/Legal Involvement Pertinent to Current Situation/Hospitalization:  No - Comment as needed Significant Relationships:  Adult Children (Step Daughter- Jorge Mandril) Lives with:  Facility Resident Do you feel safe going back to the place where you live?  Yes Need for family participation in patient care:  Yes (Comment)  Care giving concerns:  Patient currently resides at ALF. Patient's step daughter reports concerns about patient's diet and eating habits noting his sodium intake is impacting his fluid retention. Patient's step daughter reported that she feels the patient will benefit from being at SNF with a proper diet and also if he needs continuous oxygen moving forward.    Social Worker assessment / plan:  CSW spoke with patient at bedside, patient alert and oriented x 4. Patient currently resides at [facility] and reports that he likes it there and would like to return. CSW inquired about patient's thoughts on SNF if medically necessary. Patient reports that he would be agreeable if he needed a higher level of care. Patient inquired about receiving a bible for people that have a hard time reading, CSW paged [facility]. CSW spoke with patient's step  daughter Chauncey Reading) on the phone. Patient's step daughter reports that she prefers [facility] or [facility] if patient needs to go to SNF. Patient's step daughter reports that she'll be out of state for a few more weeks and can be reached via telephone [phone]). CSW will continue to follow patient to assist with SNF placement or discharge back to ALF.   Employment status:  Retired Forensic scientist:  Medicaid [place], [employer] PT Recommendations:  Not assessed at this time Information / Referral to community resources:  [place]  Patient/Family's Response to care:  Patient agreeable to SNF if necessary. Patient's step daughter HCPOA agreeable to SNF.   Patient/Family's Understanding of and Emotional Response to Diagnosis, Current Treatment, and Prognosis:  Patient reports that he is here due to shortness of breath that started late yesterday. Patient reports that he spoke with the doctor and is awaiting a room to be moved from the ED. Patient reports that he has been receiving good care so far and is hoping that he can get some medication and start to feel better. Patient presented pleasant and hopeful when speaking with CSW. Patient not aware of prognosis at this point. Patient's step daughter reported that she had an in depth conversation with doctor and is happy with the care that patient is receiving.   Emotional Assessment Appearance:  Appears stated age Attitude/Demeanor/Rapport:  Other (Cooperative) Affect (typically observed):  Pleasant Orientation:  Oriented to Self, Oriented to Place, Oriented to  Time, Oriented to Situation Alcohol / Substance use:  Not Applicable  Psych involvement (Current and /or in the community):  No (Comment)  Discharge Needs  Concerns to be addressed:  No discharge needs identified Readmission within the last 30 days:  No Current discharge risk:  None Barriers to Discharge:  No Barriers Identified   Burnis Medin,  LCSW 07/16/2016, 2:30 PM

## 2016-07-16 NOTE — ED Notes (Signed)
He is comfortable in appearance and is eating breakfast with enthusiasm.

## 2016-07-16 NOTE — ED Provider Notes (Signed)
[facility] DEPT Provider Note   CSN: [id number] Arrival date & time: 07/16/16  0445   LEVEL 5 CAVEAT - DEMENTIA  History   Chief Complaint Chief Complaint  Patient presents with  . Shortness of Breath    HPI FOREST REDWINE is a 83 y.o. male.  HPI  53 Male presents with dyspnea. History is taken from the patient and EMS. The patient has a history of dementia and in his living facility has been complaining of shortness of breath the last 3 days. EMS reports he is currently being treated for pneumonia. He chronically wears 2 L nasal cannula and was found to be 86% by EMS. They placed him on a nonrebreather. Patient states he's been having some left leg swelling, which is typically how he presents when he has to much fluid. Denies any chest pain. No wheezing. Endorses a mild cough. Patient states he thinks his oxygen was working as he was on his oxygen but still becoming short of breath tonight.  Past Medical History:  Diagnosis Date  . Acute MI [facility]) Dec 2010   cardiac catheriziation and stenting of the LAD (bare metal) 03/14/09              . Anemia   . Angina   . Blood transfusion   . CAD (coronary artery disease)   . COPD (chronic obstructive pulmonary disease) ([place])   . DEMENTIA   . High cholesterol   . Hypertension   . LV dysfunction    EF 25 to 30% per echo May 2012  . NSTEMI (non-ST elevated myocardial infarction) [facility]) Nov 2011   with LV dysfunction  . Prostatic hypertrophy   . Seizures ([place]) ~ 2006   "when blood pressure went up too high"; on Dilantin  . Urethral stricture    with subsequent trauma and has an indwelling Foley catheter in place during his first MI                                                 . Urinary catheter in place    chronic  . Vision impairment    "legally blind"    Patient Active Problem List   Diagnosis Date Noted  . Chronic CHF ([place]) 06/13/2016  . HCAP (healthcare-associated pneumonia) 06/13/2016  . Bilateral leg edema   .  Hypervolemia   . Dementia 05/18/2016  . Hypoxia 12/03/2015  . Acute on chronic combined systolic and diastolic CHF (congestive heart failure) ([place]) 12/03/2015  . Rectal bleeding 07/01/2015  . UTI (lower urinary tract infection) 03/07/2012  . Lower GI bleed = suspect diverticular 03/06/2012  . Chest pain, unspecified 05/11/2011  . Preop cardiovascular exam 12/07/2010  . CAD (coronary artery disease) 08/24/2010  . Hypertension   . Seizure disorder ([place])   . Anemia   . LV dysfunction   . NSTEMI (non-ST elevated myocardial infarction) [facility])     Past Surgical History:  Procedure Laterality Date  . CARDIAC CATHETERIZATION  02/2010  . 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 CHOLECYSTECTOMY  05/30/11  . CHOLECYSTECTOMY  05/30/2011   Procedure: LAPAROSCOPIC CHOLECYSTECTOMY;  Surgeon: Judieth Keens, DO;  Location: [place];  Service: General;  Laterality: N/A;  . CORONARY ANGIOPLASTY WITH STENT PLACEMENT  03/2009   "1"; stent to LAD  .  TONSILLECTOMY     "when I was small"       Home Medications    Prior to Admission medications   Medication Sig Start Date End Date Taking? Authorizing Provider  acetaminophen (TYLENOL) 500 MG tablet Take 1,000 mg by mouth every 8 (eight) hours as needed for moderate pain.    Historical Provider, MD  aspirin 81 MG chewable tablet Chew 81 mg by mouth daily.    Historical Provider, MD  atorvastatin (LIPITOR) 10 MG tablet Take 10 mg by mouth daily.    Historical Provider, MD  benzocaine (ORAJEL MOUTH-AID) 20 % GEL Use as directed 1 application in the mouth or throat 4 (four) times daily as needed (gum pain).    Historical Provider, MD  bismuth subsalicylate (PEPTO BISMOL) 262 MG/15ML suspension Take 30 mLs by mouth as needed for diarrhea or loose stools.     Historical Provider, MD  carvedilol (COREG) 6.25 MG tablet Take 6.25 mg by mouth 2 (two) times daily with a meal.    Historical Provider, MD  cholecalciferol  (VITAMIN D) 1000 UNITS tablet Take 1,000 Units by mouth daily.    Historical Provider, MD  dextromethorphan (DELSYM) 30 MG/5ML liquid Take 10 mLs by mouth daily as needed for cough.     Historical Provider, MD  donepezil (ARICEPT) 10 MG tablet Take 10 mg by mouth at bedtime.    Historical Provider, MD  Fluticasone Furoate-Vilanterol 100-25 MCG/INH AEPB Inhale 1 puff into the lungs daily with breakfast.    Historical Provider, MD  furosemide (LASIX) 40 MG tablet Take 1 tablet (40 mg total) by mouth daily. 12/06/15   Silver Huguenin Elgergawy, MD  guaiFENesin (MUCINEX) 600 MG 12 hr tablet Take 1 tablet (600 mg total) by mouth 2 (two) times daily. 06/16/16   Silver Huguenin Elgergawy, MD  hydrocortisone cream (PREPARATION H HYDROCORTISONE) 1 % Apply 1 application topically 2 (two) times daily as needed. For hemmoroids.    Historical Provider, MD  loperamide (IMODIUM) 2 MG capsule Take 1 capsule (2 mg total) by mouth 4 (four) times daily as needed for diarrhea or loose stools. 02/28/16   Orpah Greek, MD  losartan (COZAAR) 25 MG tablet Take 25 mg by mouth daily.    Historical Provider, MD  mirabegron ER (MYRBETRIQ) 50 MG TB24 tablet Take 50 mg by mouth daily with breakfast.    Historical Provider, MD  nitroGLYCERIN (NITROSTAT) 0.3 MG SL tablet Place 0.3 mg under the tongue every 5 (five) minutes as needed for chest pain.    Historical Provider, MD  phenytoin (DILANTIN) 100 MG ER capsule Take 200 mg by mouth 2 (two) times daily.    Historical Provider, MD  spironolactone (ALDACTONE) 25 MG tablet Take 25 mg by mouth every morning.    Historical Provider, MD    Family History Family History  Problem Relation Age of Onset  . Colon cancer Neg Hx     Social History Social History  Substance Use Topics  . Smoking status: Former Smoker    Packs/day: 0.00    Years: 50.00    Types: Cigarettes    Quit date: 08/22/2004  . Smokeless tobacco: Never Used  . Alcohol use No     Comment: "quit drinking 1980's"      Allergies   Red dye and Iodinated diagnostic agents   Review of Systems Review of Systems  Unable to perform ROS: Dementia  Respiratory: Positive for cough and shortness of breath.   Cardiovascular: Positive for leg swelling.  Physical Exam Updated Vital Signs BP (!) 150/87 (BP Location: Right Arm)   Pulse (!) 110   Temp 97.9 F (36.6 C) (Oral)   Resp 20   Ht 5\' 8"  (1.727 m)   Wt 150 lb (68 kg)   SpO2 92%   BMI 22.81 kg/m   Physical Exam  Constitutional: He appears well-developed and well-nourished. No distress.  HENT:  Head: Normocephalic and atraumatic.  Right Ear: External ear normal.  Left Ear: External ear normal.  Nose: Nose normal.  Eyes: Right eye exhibits no discharge. Left eye exhibits no discharge.  Neck: Neck supple.  Cardiovascular: Normal rate, regular rhythm and normal heart sounds.   Pulmonary/Chest: Effort normal. He has rales.  Abdominal: Soft. There is no tenderness.  Musculoskeletal: He exhibits edema (L>right foot).  Neurological: He is alert.  Skin: Skin is warm and dry. He is not diaphoretic.  Nursing note and vitals reviewed.    ED Treatments / Results  Labs (all labs ordered are listed, but only abnormal results are displayed) Labs Reviewed  COMPREHENSIVE METABOLIC PANEL - Abnormal; Notable for the following:       Result Value   Calcium 8.8 (*)    All other components within normal limits  TROPONIN I - Abnormal; Notable for the following:    Troponin I 0.03 (*)    All other components within normal limits  BRAIN NATRIURETIC PEPTIDE - Abnormal; Notable for the following:    B Natriuretic Peptide 1,572.0 (*)    All other components within normal limits  CBC WITH DIFFERENTIAL/PLATELET - Abnormal; Notable for the following:    RBC 3.91 (*)    Hemoglobin 11.8 (*)    HCT 35.6 (*)    All other components within normal limits  URINALYSIS, ROUTINE W REFLEX MICROSCOPIC - Abnormal; Notable for the following:    Color, Urine RED  (*)    APPearance CLOUDY (*)    Hgb urine dipstick SMALL (*)    Leukocytes, UA LARGE (*)    Bacteria, UA MANY (*)    Squamous Epithelial / LPF 0-5 (*)    All other components within normal limits  CULTURE, EXPECTORATED SPUTUM-ASSESSMENT  TROPONIN I  TROPONIN I  TROPONIN I    EKG  EKG Interpretation  Date/Time:  Sunday July 16 2016 05:35:12 EDT Ventricular Rate:  72 PR Interval:    QRS Duration: 114 QT Interval:  448 QTC Calculation: 491 R Axis:   34 Text Interpretation:  Sinus rhythm Ventricular premature complex Borderline prolonged PR interval Incomplete left bundle branch block Probable left ventricular hypertrophy Borderline prolonged QT interval no significant change since Mar 2018 Confirmed by Regenia Skeeter MD, [place] [phone]) on 07/16/2016 5:45:53 AM Also confirmed by Regenia Skeeter MD, [place] [phone]), editor Drema Pry [phone])  on 07/16/2016 7:46:02 AM       Radiology Dg Chest 2 View  Result Date: 07/16/2016 CLINICAL DATA:  Acute onset of shortness of breath. Initial encounter. EXAM: CHEST  2 VIEW COMPARISON:  Chest radiograph performed 06/14/2016 FINDINGS: The lungs are well-aerated. Small bilateral pleural effusions noted. Patchy bibasilar airspace opacities may reflect pulmonary edema or pneumonia. Underlying vascular congestion is seen. No pneumothorax is identified. The heart is enlarged.  No acute osseous abnormalities are seen. IMPRESSION: Small bilateral pleural effusions. Underlying vascular congestion and cardiomegaly. Patchy bibasilar airspace opacities may reflect pulmonary edema or pneumonia. Electronically Signed   By: Garald Balding M.D.   On: 07/16/2016 06:02    Procedures Procedures (including critical care time)  Medications Ordered  in ED Medications - No data to display   Initial Impression / Assessment and Plan / ED Course  I have reviewed the triage vital signs and the nursing notes.  Pertinent labs & imaging results that were available during my  care of the patient were reviewed by me and considered in my medical decision making (see chart for details).     Patient has an increased O2 requirement (from 3 to 6L). No distress. No significant increased WOB on the oxygen. I believe his CXR represents CHF as there is currently no significant cough in ED, normal WBC, no fevers and overall appears to have fluid overload c/w CHF. Will give IV lasix and admit to hospitalist.  Final Clinical Impressions(s) / ED Diagnoses   Final diagnoses:  Acute congestive heart failure, unspecified heart failure type [facility])    New Prescriptions New Prescriptions   No medications on file     Sherwood Gambler, MD 07/16/16 1324

## 2016-07-16 NOTE — ED Notes (Signed)
Patient's step daughter Ronnald Collum [phone]  Healthcare POA

## 2016-07-16 NOTE — ED Notes (Signed)
Bed: RESB Expected date:  Expected time:  Means of arrival:  Comments: EMS shortness of breath

## 2016-07-16 NOTE — ED Notes (Signed)
Dr Regenia Skeeter informed pts troponin 0.03 and informed Armed forces operational officer.

## 2016-07-16 NOTE — ED Triage Notes (Signed)
Shortness of breath since yesterday in hospital in February 2018 for CHF with rales bil. sats 86 on 2l at [facility] able to speak in full sentences on NRB mask on arrival.

## 2016-07-16 NOTE — Evaluation (Signed)
Clinical/Bedside Swallow Evaluation Patient Details  Name: [name] MRN: [id number] Date of Birth: 02-01-1933  Today's Date: 07/16/2016 Time: SLP Start Time (ACUTE ONLY): 65 SLP Stop Time (ACUTE ONLY): 1610 SLP Time Calculation (min) (ACUTE ONLY): 20 min  Past Medical History:  Past Medical History:  Diagnosis Date  . Acute MI [facility]) Dec 2010   cardiac catheriziation and stenting of the LAD (bare metal) 03/14/09              . Anemia   . Angina   . Blood transfusion   . CAD (coronary artery disease)   . COPD (chronic obstructive pulmonary disease) ([place])   . DEMENTIA   . High cholesterol   . Hypertension   . LV dysfunction    EF 25 to 30% per echo May 2012  . NSTEMI (non-ST elevated myocardial infarction) [facility]) Nov 2011   with LV dysfunction  . Prostatic hypertrophy   . Seizures ([place]) ~ 2006   "when blood pressure went up too high"; on Dilantin  . Urethral stricture    with subsequent trauma and has an indwelling Foley catheter in place during his first MI                                                 . Urinary catheter in place    chronic  . Vision impairment    "legally blind"   Past Surgical History:  Past Surgical History:  Procedure Laterality Date  . CARDIAC CATHETERIZATION  02/2010  . 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 CHOLECYSTECTOMY  05/30/11  . CHOLECYSTECTOMY  05/30/2011   Procedure: LAPAROSCOPIC CHOLECYSTECTOMY;  Surgeon: Judieth Keens, DO;  Location: [place];  Service: General;  Laterality: N/A;  . CORONARY ANGIOPLASTY WITH STENT PLACEMENT  03/2009   "1"; stent to LAD  . TONSILLECTOMY     "when I was small"   HPI:  83 y.o. 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 He presents today with increase  SOB, productive cough and increase oxygen requirement. He denies chest pain; BSE ordered d/t hx of PNA and possible lack of reciprocity with deglutition and respiration.   Assessment / Plan / Recommendation Clinical Impression   Pt without overt s/s of aspiration with any consistency during BSE; however, due to probable lack of reciprocity with deglutition and respiration d/t respiratory compromise and impaired mastication, pt may be at mild risk for aspiration.  Recommend Dysphagia 3 (mechanical/soft) diet with thin liquids with smaller volumes (no straws); ST will f/u while in house for diet tolerance/aspiration precaution training. SLP Visit Diagnosis: Dysphagia, oral phase (R13.11)    Aspiration Risk  No limitations    Diet Recommendation   Dysphagia 3 (mechanical/soft)/thin liquids  Medication Administration: Other (Comment) (whole with liquids as tolerated)    Other  Recommendations Oral Care Recommendations: Oral care BID   Follow up Recommendations Skilled Nursing facility      Frequency and Duration min 2x/week  1 week       Prognosis Prognosis for Safe Diet Advancement: Good      Swallow Study  General Date of Onset: 07/16/16 HPI: 83 y.o. 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 He presents today with increase SOB, productive cough and increase oxygen requirement. He denies chest pain. Type of Study: Bedside Swallow Evaluation Diet Prior to this Study: Regular;Thin liquids Temperature Spikes Noted: No Respiratory Status: Nasal cannula History of Recent Intubation: No Behavior/Cognition: Alert;Cooperative Oral Cavity Assessment: Within Functional Limits Oral Care Completed by SLP: No Oral Cavity - Dentition: Missing dentition Vision: Functional for  self-feeding Self-Feeding Abilities: Able to feed self;Needs set up Patient Positioning: Upright in bed Baseline Vocal Quality: Low vocal intensity Volitional Cough: Strong Volitional Swallow: Able to elicit    Oral/Motor/Sensory Function     Ice Chips Ice chips: Within functional limits Presentation: Spoon   Thin Liquid Thin Liquid: Within functional limits Presentation: Cup;Spoon;Straw    Nectar Thick Nectar Thick Liquid: Not tested   Honey Thick Honey Thick Liquid: Not tested   Puree Puree: Within functional limits Presentation: Spoon   Solid      Solid: Impaired Presentation: Self Fed Oral Phase Impairments: Impaired mastication Oral Phase Functional Implications: Impaired mastication    Functional Assessment Tool Used:  (NOMS; clinical judgment) Functional Limitations: Swallowing Swallow Current Status (P7793): At least 1 percent but less than 20 percent impaired, limited or restricted Swallow Goal Status [phone]): At least 1 percent but less than 20 percent impaired, limited or restricted   [name], M.S., CCC-SLP 07/16/2016,4:13 PM

## 2016-07-17 DIAGNOSIS — F0390 Unspecified dementia without behavioral disturbance: Secondary | ICD-10-CM

## 2016-07-17 DIAGNOSIS — I2510 Atherosclerotic heart disease of native coronary artery without angina pectoris: Secondary | ICD-10-CM

## 2016-07-17 DIAGNOSIS — J9621 Acute and chronic respiratory failure with hypoxia: Secondary | ICD-10-CM

## 2016-07-17 DIAGNOSIS — G40909 Epilepsy, unspecified, not intractable, without status epilepticus: Secondary | ICD-10-CM

## 2016-07-17 DIAGNOSIS — I5043 Acute on chronic combined systolic (congestive) and diastolic (congestive) heart failure: Secondary | ICD-10-CM

## 2016-07-17 LAB — CBC
HCT: 37.3 % — ABNORMAL LOW (ref 39.0–52.0)
Hemoglobin: 12.3 g/dL — ABNORMAL LOW (ref 13.0–17.0)
MCH: 29.9 pg (ref 26.0–34.0)
MCHC: 33 g/dL (ref 30.0–36.0)
MCV: 90.5 fL (ref 78.0–100.0)
PLATELETS: 193 10*3/uL (ref 150–400)
RBC: 4.12 MIL/uL — ABNORMAL LOW (ref 4.22–5.81)
RDW: 13.2 % (ref 11.5–15.5)
WBC: 7.4 10*3/uL (ref 4.0–10.5)

## 2016-07-17 LAB — BASIC METABOLIC PANEL
Anion gap: 6 (ref 5–15)
BUN: 15 mg/dL (ref 6–20)
CHLORIDE: 109 mmol/L (ref 101–111)
CO2: 25 mmol/L (ref 22–32)
CREATININE: 1.18 mg/dL (ref 0.61–1.24)
Calcium: 8.5 mg/dL — ABNORMAL LOW (ref 8.9–10.3)
GFR calc Af Amer: >60 mL/min (ref >60)
GFR calc non Af Amer: 56 mL/min — ABNORMAL LOW (ref >60)
GLUCOSE: 85 mg/dL (ref 65–99)
POTASSIUM: 4 mmol/L (ref 3.5–5.1)
Sodium: 140 mmol/L (ref 135–145)

## 2016-07-17 LAB — TROPONIN I: Troponin I: 0.03 ng/mL (ref <0.03)

## 2016-07-17 NOTE — NC FL2 (Signed)
[place] LEVEL OF CARE SCREENING TOOL     IDENTIFICATION  Patient Name: [name] Birthdate: [birth year] Sex: male Admission Date (Current Location): 07/16/2016  [facility] and [place] Number:  Herbalist and Address:  [facility],  [place] [address], [place]      Provider Number: [id number]  Attending Physician Name and Address:  Patrecia Pour, MD  Relative Name and Phone Number:       Current Level of Care: SNF Recommended Level of Care: [place] Prior Approval Number:    Date Approved/Denied:   PASRR Number: [id number]  Discharge Plan: SNF    Current Diagnoses: Patient Active Problem List   Diagnosis Date Noted  . Heart failure ([place]) 07/16/2016  . Acute on chronic respiratory failure with hypoxemia ([place]) 07/16/2016  . Chronic CHF ([place]) 06/13/2016  . HCAP (healthcare-associated pneumonia) 06/13/2016  . Bilateral leg edema   . Hypervolemia   . Dementia 05/18/2016  . Hypoxia 12/03/2015  . Acute on chronic combined systolic and diastolic CHF (congestive heart failure) ([place]) 12/03/2015  . Rectal bleeding 07/01/2015  . UTI (lower urinary tract infection) 03/07/2012  . Lower GI bleed = suspect diverticular 03/06/2012  . Chest pain, unspecified 05/11/2011  . Preop cardiovascular exam 12/07/2010  . CAD (coronary artery disease) 08/24/2010  . Hypertension   . Seizure disorder ([place])   . Anemia   . LV dysfunction   . NSTEMI (non-ST elevated myocardial infarction) ([place])     Orientation RESPIRATION BLADDER Height & Weight     Self, Time, Situation, Place  O2 (3L [place]) Indwelling catheter, Incontinent Weight: 139 lb 15.9 oz (63.5 kg) Height:  5\' 8"  (172.7 cm)  BEHAVIORAL SYMPTOMS/MOOD NEUROLOGICAL BOWEL NUTRITION STATUS      Continent Diet (see DC summary)  AMBULATORY STATUS COMMUNICATION OF NEEDS Skin   Extensive Assist Verbally Normal                       Personal Care Assistance Level of  Assistance  Bathing, Dressing Bathing Assistance: Maximum assistance   Dressing Assistance: Maximum assistance     Functional Limitations Info             SPECIAL CARE FACTORS FREQUENCY  PT (By licensed PT), OT (By licensed OT)     PT Frequency: 5/wk OT Frequency: 5/wk            Contractures      Additional Factors Info  Code Status, Allergies Code Status Info: DNR Allergies Info: Red Dye, Iodinated Diagnostic Agents           Current Medications (07/17/2016):  This is the current hospital active medication list Current Facility-Administered Medications  Medication Dose Route Frequency Provider Last Rate Last Dose  . 0.9 %  sodium chloride infusion  250 mL Intravenous PRN Belkys A Regalado, MD      . acetaminophen (TYLENOL) tablet 1,000 mg  1,000 mg Oral Q8H PRN Belkys A Regalado, MD      . albuterol (PROVENTIL) (2.5 MG/3ML) 0.083% nebulizer solution 2.5 mg  2.5 mg Nebulization Q6H PRN Belkys A Regalado, MD      . aspirin chewable tablet 81 mg  81 mg Oral Daily Belkys A Regalado, MD   81 mg at 07/17/16 0923  . atorvastatin (LIPITOR) tablet 10 mg  10 mg Oral Daily Belkys A Regalado, MD   10 mg at 07/17/16 0923  . benzocaine (ORAJEL) 10 % mucosal gel  Mouth/Throat QID PRN Belkys A Regalado, MD      . carvedilol (COREG) tablet 6.25 mg  6.25 mg Oral BID WC Belkys A Regalado, MD   6.25 mg at 07/17/16 0800  . cholecalciferol (VITAMIN D) tablet 1,000 Units  1,000 Units Oral Daily Elmarie Shiley, MD   1,000 Units at 07/17/16 0923  . donepezil (ARICEPT) tablet 10 mg  10 mg Oral QHS Belkys A Regalado, MD   10 mg at 07/16/16 2114  . enoxaparin (LOVENOX) injection 40 mg  40 mg Subcutaneous Q24H Belkys A Regalado, MD   40 mg at 07/16/16 2114  . fluticasone furoate-vilanterol (BREO ELLIPTA) 100-25 MCG/INH 1 puff  1 puff Inhalation Q breakfast Belkys A Regalado, MD   1 puff at 07/17/16 0821  . furosemide (LASIX) injection 40 mg  40 mg Intravenous Q12H Belkys A Regalado, MD   40  mg at 07/17/16 0924  . guaiFENesin (MUCINEX) 12 hr tablet 600 mg  600 mg Oral BID Belkys A Regalado, MD   600 mg at 07/17/16 0923  . losartan (COZAAR) tablet 25 mg  25 mg Oral Daily Belkys A Regalado, MD   25 mg at 07/17/16 0922  . mirabegron ER (MYRBETRIQ) tablet 50 mg  50 mg Oral Q breakfast Belkys A Regalado, MD      . nitroGLYCERIN (NITROSTAT) SL tablet 0.3 mg  0.3 mg Sublingual Q5 min PRN Belkys A Regalado, MD      . ondansetron (ZOFRAN) tablet 4 mg  4 mg Oral Q6H PRN Belkys A Regalado, MD       Or  . ondansetron (ZOFRAN) injection 4 mg  4 mg Intravenous Q6H PRN Belkys A Regalado, MD      . phenytoin (DILANTIN) ER capsule 200 mg  200 mg Oral BID Belkys A Regalado, MD   200 mg at 07/17/16 0921  . potassium chloride SA (K-DUR,KLOR-CON) CR tablet 40 mEq  40 mEq Oral Daily Belkys A Regalado, MD   40 mEq at 07/17/16 0921  . sodium chloride flush (NS) 0.9 % injection 3 mL  3 mL Intravenous Q12H Belkys A Regalado, MD   3 mL at 07/17/16 1000  . sodium chloride flush (NS) 0.9 % injection 3 mL  3 mL Intravenous Q12H Belkys A Regalado, MD   3 mL at 07/16/16 1327  . sodium chloride flush (NS) 0.9 % injection 3 mL  3 mL Intravenous PRN Belkys A Regalado, MD      . spironolactone (ALDACTONE) tablet 25 mg  25 mg Oral q morning - 10a Belkys A Regalado, MD   25 mg at 07/17/16 5670     Discharge Medications: Please see discharge summary for a list of discharge medications.  Relevant Imaging Results:  Relevant Lab Results:   Additional Information SS#: [id number]  Jorge Ny, LCSW

## 2016-07-17 NOTE — Evaluation (Signed)
Occupational Therapy Evaluation Patient Details Name: [name] MRN: [id number] DOB: 05/02/1932 Today's Date: 07/17/2016    History of Present Illness 83 y.o. 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 He presents today with increase SOB, productive cough and increase oxygen requirement.   patient found to by hypoxic, requiring 6 L of Oxygen. Chest x ray with Bilateral pulmonary edema, BNP: 1572   Clinical Impression   Pt admitted with  Increased SOB.  Pt currently with functional limitations due to the deficits listed below (see OT Problem List).  Pt will benefit from skilled OT to increase their safety and independence with ADL and functional mobility for ADL to facilitate discharge to venue listed below.      Follow Up Recommendations  Home health OT;Other (comment);SNF (HH at ALF or SNF depending on progress)    Equipment Recommendations  None recommended by OT       Precautions / Restrictions Precautions Precautions: Fall Precaution Comments: monitor vitals      Mobility Bed Mobility Overal bed mobility: Needs Assistance Bed Mobility: Supine to Sit     Supine to sit: Supervision        Transfers Overall transfer level: Needs assistance Equipment used: Rolling walker (2 wheeled) Transfers: Sit to/from Stand Sit to Stand: Min assist         General transfer comment: steady assist upon standing up    Balance                                           ADL either performed or assessed with clinical judgement   ADL Overall ADL's : Needs assistance/impaired Eating/Feeding: Set up;Sitting   Grooming: Set up;Sitting   Upper Body Bathing: Minimal assistance;Sitting   Lower Body Bathing: Maximal assistance;Sit to/from stand;Cueing for  safety;Cueing for sequencing   Upper Body Dressing : Minimal assistance;Sitting   Lower Body Dressing: Maximal assistance;Sit to/from stand;Cueing for safety;Cueing for sequencing   Toilet Transfer: Moderate assistance;RW;Ambulation Toilet Transfer Details (indicate cue type and reason): chair to chair Toileting- Clothing Manipulation and Hygiene: Maximal assistance;Sit to/from stand;Cueing for safety;Cueing for compensatory techniques         General ADL Comments: pt in chair upon OT arrival.  At end of OT session pt was moving to another room - Aed pt get to [place] Vision/History: Wears glasses              Pertinent Vitals/Pain Pain Assessment: No/denies pain     Hand Dominance     Extremity/Trunk Assessment Upper Extremity Assessment Upper Extremity Assessment: Generalized weakness           Communication Communication Communication: HOH   Cognition Arousal/Alertness: Awake/alert Behavior During Therapy: WFL for tasks assessed/performed Overall Cognitive Status: Within Functional Limits for tasks assessed                                                Home Living Family/patient expects to be discharged to:: Assisted living  Home Equipment: [place] - 2 wheels;Cane - single point;Shower seat   Additional Comments: needed a RW to ambulate last admission, went home on oxygen.       Prior Functioning/Environment Level of Independence: Needs assistance  Gait / Transfers Assistance Needed: patient astates that he was not able to ambulate withput assistnace due to need  oxygen and RW. Was receiving HHPT.              OT Problem List: Decreased strength;Decreased activity tolerance;Decreased knowledge of use of DME or AE;Decreased safety awareness      OT Treatment/Interventions: Self-care/ADL training;DME and/or AE instruction;Patient/family education    OT Goals(Current goals can  be found in the care plan section) Acute Rehab OT Goals Patient Stated Goal: I want to have a small oxygen tank so I can walk bymyself OT Goal Formulation: With patient Time For Goal Achievement: 07/31/16 Potential to Achieve Goals: Good  OT Frequency: Min 2X/week           End of Session Equipment Utilized During Treatment: Rolling walker;Oxygen Nurse Communication: Mobility status  Activity Tolerance: Patient tolerated treatment well Patient left: in chair  OT Visit Diagnosis: Unsteadiness on feet (R26.81);Muscle weakness (generalized) (M62.81);Other abnormalities of gait and mobility (R26.89)                Time: 2426-8341 OT Time Calculation (min): 30 min Charges:  OT General Charges $OT Visit: 1 Procedure OT Evaluation $OT Eval Moderate Complexity: 1 Procedure OT Treatments $Self Care/Home Management : 8-22 mins G-Codes:     Kari Baars, OT [phone]  Payton Mccallum D 07/17/2016, 4:47 PM

## 2016-07-17 NOTE — Progress Notes (Signed)
PROGRESS NOTE  [name]  WOE:[id number] DOB: Jun 05, 1932 DOA: 07/16/2016 PCP: Tivis Ringer, MD   Brief Narrative: [name] is an 83 y.o. male with medical history significant of combined CHF (EF 15-20%, seizure disorder, and COPD on 3L home oxygen, recently discharged 3/16 after HCAP Tx, who presented from ALF with dyspnea and increased oxygen requirement, up to 6L by [place] on arrival. His POA reports he does not follow low salt diet and doubts he can return to ALF. PT evaluation recommended SNF.   CXR showed bilateral pulmonary edema. BNP was elevated to 1572. IV lasix was started with improvement in respiratory status. He will be transferred to medical floor 4/16 and diuresis ongoing.   Assessment & Plan: Active Problems:   Hypertension   Seizure disorder (HCC)   CAD (coronary artery disease)   Acute on chronic combined systolic and diastolic CHF (congestive heart failure) (HCC)   Dementia   Heart failure (HCC)   Acute on chronic respiratory failure with hypoxemia (HCC)  Acute hypoxic respiratory failure: Likely related to CHF exacerbation. Chest x ray with patchy bilateral airspace diseases, Pulmonary edema vs PNA. Elevated BNP at 1500.  - No impending respiratory distress, improving, will transfer to floor - No abx at this time as there is no fever or leukocytosis. Monitor sputum culture.  - SLP evaluation  Acute combined HF exacerbation: EF 15-20% by ECHO in Feb 2018. 150lbs on admission, which was discharge weight at last hospitalization. Has significant crackles on exam and pulm edema on CXR w/elevated BNP. Troponins 0.03 x3, no ACS. - Continue lasix 40 mg IV BID.  - Continue with ACE, spironolactone and coreg.  - Monitor renal function, daily weights, I/O  Dementia: Chronic, stable. Continue Aricept.  COPD: No evidence of exacerbation. Continue nebulizer Tx.  Seizure disorder: Stable. Continue with dilantin.  Chronic foley catheter: Gets changed out q3wk at  [facility]. Will continue here.   DVT prophylaxis: Lovenox Code Status: DNR Family Communication: None at bedside Disposition Plan: SNF, likely medically stable in 48 hours.  Consultants:   None  Procedures:   None  Antimicrobials:  None   Subjective: Dyspnea improving, still coughing up brown sputum occasionally. No fever, chest pain or wheezing.   Objective: Vitals:   07/17/16 0800 07/17/16 0822 07/17/16 1000 07/17/16 1200  BP: (!) 147/70  115/63 (!) 99/55  Pulse: 79  (!) 108 (!) 57  Resp:      Temp: 98.2 F (36.8 C)   97.9 F (36.6 C)  TempSrc: Oral   Oral  SpO2: 95% 90% 95% 95%  Weight:      Height:        Intake/Output Summary (Last 24 hours) at 07/17/16 1528 Last data filed at 07/17/16 0545  Gross per 24 hour  Intake             1160 ml  Output              450 ml  Net              710 ml   Filed Weights   07/16/16 0448 07/17/16 0413  Weight: 68 kg (150 lb) 63.5 kg (139 lb 15.9 oz)    Examination: General exam: Pleasant elderly male in no distress. Respiratory system: Nonlabored on 5L. Crackles bilaterally to midlung zones. Cardiovascular system: Regular rate and rhythm. No murmur, rub, or gallop. No JVD, and no pedal edema. Gastrointestinal system: Abdomen soft, non-tender, non-distended, with normoactive bowel sounds. No organomegaly  or masses felt. GU: Foley in place without irritation. Central nervous system: Alert and conversant. No focal neurological deficits. Extremities: Warm, no deformities Skin: No rashes, lesions no ulcers Psychiatry: Judgement and insight appear impaired. Mood & affect appropriate.   Data Reviewed: I have personally reviewed following labs and imaging studies  CBC:  Recent Labs Lab 07/16/16 0525 07/17/16 0353  WBC 6.6 7.4  NEUTROABS 4.6  --   HGB 11.8* 12.3*  HCT 35.6* 37.3*  MCV 91.0 90.5  PLT 198 347   Basic Metabolic Panel:  Recent Labs Lab 07/16/16 0525 07/17/16 0353  NA 141 140  K 3.6 4.0    CL 111 109  CO2 23 25  GLUCOSE 95 85  BUN 15 15  CREATININE 0.99 1.18  CALCIUM 8.8* 8.5*   GFR: Estimated Creatinine Clearance: 44.1 mL/min (by C-G formula based on SCr of 1.18 mg/dL). Liver Function Tests:  Recent Labs Lab 07/16/16 0525  AST 20  ALT 18  ALKPHOS 66  BILITOT 0.6  PROT 6.5  ALBUMIN 3.8   No results for input(s): LIPASE, AMYLASE in the last 168 hours. No results for input(s): AMMONIA in the last 168 hours. Coagulation Profile: No results for input(s): INR, PROTIME in the last 168 hours. Cardiac Enzymes:  Recent Labs Lab 07/16/16 0525 07/16/16 1239 07/16/16 1843 07/17/16 0353  TROPONINI 0.03* 0.03* 0.03* 0.03*   BNP (last 3 results) No results for input(s): PROBNP in the last 8760 hours. HbA1C: No results for input(s): HGBA1C in the last 72 hours. CBG: No results for input(s): GLUCAP in the last 168 hours. Lipid Profile: No results for input(s): CHOL, HDL, LDLCALC, TRIG, CHOLHDL, LDLDIRECT in the last 72 hours. Thyroid Function Tests: No results for input(s): TSH, T4TOTAL, FREET4, T3FREE, THYROIDAB in the last 72 hours. Anemia Panel: No results for input(s): VITAMINB12, FOLATE, FERRITIN, TIBC, IRON, RETICCTPCT in the last 72 hours. Urine analysis:    Component Value Date/Time   COLORURINE RED (A) 07/16/2016 1009   APPEARANCEUR CLOUDY (A) 07/16/2016 1009   LABSPEC 1.005 07/16/2016 1009   PHURINE 5.0 07/16/2016 1009   GLUCOSEU NEGATIVE 07/16/2016 1009   HGBUR SMALL (A) 07/16/2016 1009   BILIRUBINUR NEGATIVE 07/16/2016 1009   KETONESUR NEGATIVE 07/16/2016 1009   PROTEINUR NEGATIVE 07/16/2016 1009   UROBILINOGEN 0.2 08/08/2014 1109   NITRITE NEGATIVE 07/16/2016 1009   LEUKOCYTESUR LARGE (A) 07/16/2016 1009   Recent Results (from the past 240 hour(s))  MRSA PCR Screening     Status: None   Collection Time: 07/16/16  3:55 PM  Result Value Ref Range Status   MRSA by PCR NEGATIVE NEGATIVE Final    Comment:        The GeneXpert MRSA Assay  (FDA approved for NASAL specimens only), is one component of a comprehensive MRSA colonization surveillance program. It is not intended to diagnose MRSA infection nor to guide or monitor treatment for MRSA infections.   Culture, expectorated sputum-assessment     Status: None   Collection Time: 07/16/16  6:06 PM  Result Value Ref Range Status   Specimen Description SPUTUM  Final   Special Requests NONE  Final   Sputum evaluation THIS SPECIMEN IS ACCEPTABLE FOR SPUTUM CULTURE  Final   Report Status 07/16/2016 FINAL  Final  Culture, respiratory (NON-Expectorated)     Status: None (Preliminary result)   Collection Time: 07/16/16  6:06 PM  Result Value Ref Range Status   Specimen Description SPUTUM  Final   Special Requests NONE Reflexed from [phone]  Final   Gram Stain   Final    FEW WBC PRESENT, PREDOMINANTLY PMN FEW GRAM POSITIVE COCCI IN PAIRS RARE GRAM POSITIVE COCCI IN CHAINS RARE GRAM NEGATIVE RODS    Culture   Final    TOO YOUNG TO READ Performed at [facility], [address] [place], [place] [address]    Report Status PENDING  Incomplete      Radiology Studies: Dg Chest 2 View  Result Date: 07/16/2016 CLINICAL DATA:  Acute onset of shortness of breath. Initial encounter. EXAM: CHEST  2 VIEW COMPARISON:  Chest radiograph performed 06/14/2016 FINDINGS: The lungs are well-aerated. Small bilateral pleural effusions noted. Patchy bibasilar airspace opacities may reflect pulmonary edema or pneumonia. Underlying vascular congestion is seen. No pneumothorax is identified. The heart is enlarged.  No acute osseous abnormalities are seen. IMPRESSION: Small bilateral pleural effusions. Underlying vascular congestion and cardiomegaly. Patchy bibasilar airspace opacities may reflect pulmonary edema or pneumonia. Electronically Signed   By: Garald Balding M.D.   On: 07/16/2016 06:02    Scheduled Meds: . aspirin  81 mg Oral Daily  . atorvastatin  10 mg Oral Daily  . carvedilol   6.25 mg Oral BID WC  . cholecalciferol  1,000 Units Oral Daily  . donepezil  10 mg Oral QHS  . enoxaparin (LOVENOX) injection  40 mg Subcutaneous Q24H  . fluticasone furoate-vilanterol  1 puff Inhalation Q breakfast  . furosemide  40 mg Intravenous Q12H  . guaiFENesin  600 mg Oral BID  . losartan  25 mg Oral Daily  . mirabegron ER  50 mg Oral Q breakfast  . phenytoin  200 mg Oral BID  . potassium chloride  40 mEq Oral Daily  . sodium chloride flush  3 mL Intravenous Q12H  . sodium chloride flush  3 mL Intravenous Q12H  . spironolactone  25 mg Oral q morning - 10a   Continuous Infusions:   LOS: 1 day   Time spent: 25 minutes.  Vance Gather, MD [name] Pager [phone]  If 7PM-7AM, please contact night-coverage [email or web address] Password [facility] 07/17/2016, 3:28 PM

## 2016-07-17 NOTE — Evaluation (Signed)
Physical Therapy Evaluation Patient Details Name: [name] MRN: [id number] DOB: 1932/06/04 Today's Date: 07/17/2016   History of Present Illness  83 y.o. 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 He presents today with increase SOB, productive cough and increase oxygen requirement.   patient found to by hypoxic, requiring 6 L of Oxygen. Chest x ray with Bilateral pulmonary edema, BNP: 1572  Clinical Impression  The patient is known to this PT from previous admission. The patient is requiring oxygen to maintain  Saturation with dropping to 86% with minimal activity. He also appears  Unsteady when up with the walker. Pt admitted with above diagnosis. Pt currently with functional limitations due to the deficits listed below (see PT Problem List). * Pt will benefit from skilled PT to increase their independence and safety with mobility to allow discharge to the venue listed below.       Follow Up Recommendations SNF;Supervision/Assistance - 24 hour    Equipment Recommendations  None recommended by PT    Recommendations for Other Services       Precautions / Restrictions Precautions Precautions: Fall Restrictions Weight Bearing Restrictions: No      Mobility  Bed Mobility Overal bed mobility: Needs Assistance Bed Mobility: Supine to Sit     Supine to sit: Supervision     General bed mobility comments: no assistanc erequired  Transfers Overall transfer level: Needs assistance Equipment used: Rolling walker (2 wheeled) Transfers: Sit to/from Stand Sit to Stand: Min assist         General transfer comment: steady assist upon standing up  Ambulation/Gait Ambulation/Gait assistance: Mod assist Ambulation Distance (Feet): 5 Feet Assistive device: Rolling walker (2  wheeled) Gait Pattern/deviations: Step-to pattern     General Gait Details: gait unsteady, shuffling to recliner. Steady assist for balance  Stairs            Wheelchair Mobility    Modified Rankin (Stroke Patients Only)       Balance Overall balance assessment: Needs assistance Sitting-balance support: Bilateral upper extremity supported Sitting balance-Leahy Scale: Good     Standing balance support: During functional activity;Bilateral upper extremity supported Standing balance-Leahy Scale: Poor Standing balance comment: relies on  UE support                             Pertinent Vitals/Pain Pain Assessment: No/denies pain    Home Living Family/patient expects to be discharged to:: Assisted living                 Additional Comments: needed a RW to ambulate last admission, went home on oxygen.     Prior Function Level of Independence: Needs assistance   Gait / Transfers Assistance Needed: patient astates that he was not able to ambulate withput assistnace due to need  oxygen and RW. Was receiving HHPT.           Hand Dominance        Extremity/Trunk Assessment   Upper Extremity Assessment Upper Extremity Assessment: Generalized weakness    Lower Extremity Assessment Lower Extremity Assessment: Generalized weakness    Cervical / Trunk Assessment Cervical / Trunk Assessment: Kyphotic  Communication      Cognition Arousal/Alertness: Awake/alert Behavior During Therapy: WFL for tasks assessed/performed Overall Cognitive  Status: Within Functional Limits for tasks assessed                                        General Comments      Exercises     Assessment/Plan    PT Assessment Patient needs continued PT services  PT Problem List Decreased strength;Decreased activity tolerance;Decreased balance;Decreased mobility;Cardiopulmonary status limiting activity;Decreased safety awareness;Decreased knowledge of  use of DME;Decreased knowledge of precautions       PT Treatment Interventions DME instruction;Gait training;Functional mobility training;Therapeutic activities;Patient/family education    PT Goals (Current goals can be found in the Care Plan section)  Acute Rehab PT Goals Patient Stated Goal: I want to have a small oxygen tank so I can walk bymyself PT Goal Formulation: With patient Time For Goal Achievement: 07/31/16 Potential to Achieve Goals: Good    Frequency Min 3X/week   Barriers to discharge Decreased caregiver support      Co-evaluation               End of Session   Activity Tolerance: Treatment limited secondary to medical complications (Comment) (SOB) Patient left: in chair;with call bell/phone within reach;with chair alarm set Nurse Communication: Mobility status PT Visit Diagnosis: Difficulty in walking, not elsewhere classified (R26.2);Muscle weakness (generalized) (M62.81)    Time: 9201-0071 PT Time Calculation (min) (ACUTE ONLY): 31 min   Charges:   PT Evaluation $PT Eval Low Complexity: 1 Procedure PT Treatments $Therapeutic Activity: 8-22 mins   PT G CodesTresa Endo PT [phone]    Claretha Cooper 07/17/2016, 9:45 AM

## 2016-07-17 NOTE — Care Management Note (Signed)
Case Management Note  Patient Details  Name: [name] MRN: [id number] Date of Birth: 03/13/1933  Subjective/Objective:           pna and noncompliance by patient for home care.         Action/Plan:  From ALF Date:  July 17, 2016 Chart reviewed for concurrent status and case management needs. Will continue to follow patient progress. Discharge Planning: following for needs Expected discharge date: [id number] Velva Harman, BSN, [place], [place]   Expected Discharge Date:                  Expected Discharge Plan:     In-House Referral:     Discharge planning Services     Post Acute Care Choice:    Choice offered to:     DME Arranged:    DME Agency:     HH Arranged:    [place] Agency:     Status of Service:     If discussed at [place] of Stay Meetings, dates discussed:    Additional Comments:  Leeroy Cha, RN 07/17/2016, 10:46 AM

## 2016-07-18 ENCOUNTER — Encounter (HOSPITAL_COMMUNITY): Payer: Self-pay | Admitting: Radiology

## 2016-07-18 ENCOUNTER — Inpatient Hospital Stay (HOSPITAL_COMMUNITY): Payer: Medicare Other

## 2016-07-18 DIAGNOSIS — I509 Heart failure, unspecified: Secondary | ICD-10-CM

## 2016-07-18 DIAGNOSIS — K117 Disturbances of salivary secretion: Secondary | ICD-10-CM

## 2016-07-18 LAB — BASIC METABOLIC PANEL
Anion gap: 8 (ref 5–15)
BUN: 20 mg/dL (ref 6–20)
CALCIUM: 8.6 mg/dL — AB (ref 8.9–10.3)
CHLORIDE: 109 mmol/L (ref 101–111)
CO2: 22 mmol/L (ref 22–32)
CREATININE: 1.14 mg/dL (ref 0.61–1.24)
GFR calc Af Amer: >60 mL/min (ref >60)
GFR calc non Af Amer: 58 mL/min — ABNORMAL LOW (ref >60)
Glucose, Bld: 86 mg/dL (ref 65–99)
Potassium: 4 mmol/L (ref 3.5–5.1)
Sodium: 139 mmol/L (ref 135–145)

## 2016-07-18 LAB — CBC
HCT: 35.8 % — ABNORMAL LOW (ref 39.0–52.0)
Hemoglobin: 11.9 g/dL — ABNORMAL LOW (ref 13.0–17.0)
MCH: 30.4 pg (ref 26.0–34.0)
MCHC: 33.2 g/dL (ref 30.0–36.0)
MCV: 91.6 fL (ref 78.0–100.0)
PLATELETS: 196 10*3/uL (ref 150–400)
RBC: 3.91 MIL/uL — ABNORMAL LOW (ref 4.22–5.81)
RDW: 13.4 % (ref 11.5–15.5)
WBC: 6.6 10*3/uL (ref 4.0–10.5)

## 2016-07-18 LAB — URINALYSIS, ROUTINE W REFLEX MICROSCOPIC
GLUCOSE, UA: NEGATIVE mg/dL
Ketones, ur: NEGATIVE mg/dL
Nitrite: POSITIVE — AB
Protein, ur: 30 mg/dL — AB
SPECIFIC GRAVITY, URINE: 1.025 (ref 1.005–1.030)
pH: 5 (ref 5.0–8.0)

## 2016-07-18 NOTE — Progress Notes (Addendum)
  Speech Language Pathology Treatment: Dysphagia  Patient Details Name: [name] MRN: [id number] DOB: Sep 23, 1932 Today's Date: 07/18/2016 Time: 1215-     Assessment / Plan / Recommendation Clinical Impression  Pt seen eating lunch (meat, pudding, cranberry juice) - intermittent wet voice noted during meal without awareness.  Voice cleared with speaking- ? Secretion retention in larynx.  Pt expectorated meat that pt reports tired of masticating - conducted independently- advised him to continue to expectorate as needed rather than risk aspiration.     Oral retention of secretions noted x1 when on phone with drooling from right side of labia with pt using tissue to clear.  Pt reports drooling for a long time prior to this admission- uncertain of source?  Six months of drooling on the right side reported by pt - but reports his MD does NOT know.    A church friend called the pt and confirmed for this SLP that the pt's voice is normal strength.    No overt indication of aspiration however pt may have low grade chronic aspiration given lack of sensation to secretion retention in larynx - ? CN X compromise.  Of note, pt had prior MBS in 2007- no results available.  Pt able to return demonstration of compensation strategies of throat clear/cough/speaking when voice is wet.   Pt prior recording heard from YouTube with pt singing song in the 60s, demonstrating strong voice with significant range.     Options include proceed with MBS to allow instrumental swallow test or proceed with diet with precautions to mitigate risk.      Spoke to MD and he is agreeable for MBS.  Will attempt this pm or next date in am.     HPI HPI: 83 y.o. 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 He presents today  with increase SOB, productive cough and increase oxygen requirement. He denies chest pain.      SLP Plan  Continue with current plan of care       Recommendations  Diet recommendations: Dysphagia 3 (mechanical soft);Thin liquid Liquids provided via: Cup;Straw Medication Administration: Other (Comment) (whole with liquids as tolerated) Supervision: Patient able to self feed Compensations: Slow rate;Small sips/bites Postural Changes and/or Swallow Maneuvers: Seated upright 90 degrees;Upright 30-60 min after meal                Oral Care Recommendations: Oral care BID Follow up Recommendations: Skilled Nursing facility SLP Visit Diagnosis: Dysphagia, oropharyngeal phase (R13.12) Plan: Continue with current plan of care       [place], [place] [facility] SLP [phone]

## 2016-07-18 NOTE — Progress Notes (Signed)
Report for Heat CT was called to RN by Radiology per protocol. RN made MD aware of results. MD ordered MRI. Will continue to monitor patient and test results.

## 2016-07-18 NOTE — Progress Notes (Signed)
PROGRESS NOTE  [name]  OJJ:[id number] DOB: 1932/12/02 DOA: 07/16/2016 PCP: Tivis Ringer, MD   Brief Narrative: [name] is an 83 y.o. male with medical history significant of combined CHF (EF 15-20%, seizure disorder, and COPD on 3L home oxygen, recently discharged 3/16 after HCAP Tx, who presented from ALF with dyspnea and increased oxygen requirement, up to 6L by [place] on arrival. His POA reports he does not follow low salt diet and doubts he can return to ALF. PT evaluation recommended SNF.   CXR showed bilateral pulmonary edema. BNP was elevated to 1572. IV lasix was started with improvement in respiratory status. He will be transferred to medical floor 4/16 and diuresis ongoing.   Assessment & Plan: Active Problems:   Hypertension   Seizure disorder (HCC)   CAD (coronary artery disease)   Acute on chronic combined systolic and diastolic CHF (congestive heart failure) (HCC)   Dementia   Heart failure (HCC)   Acute on chronic respiratory failure with hypoxemia (HCC)  Acute hypoxic respiratory failure: Likely related to CHF exacerbation. Chest x ray with patchy bilateral airspace diseases, Pulmonary edema vs PNA. Elevated BNP at 1500.  - Wean oxygen as able with diuresis to home dependence of 3LPM.  - No abx at this time as there is no fever or leukocytosis. Monitor sputum culture.  - SLP evaluation: Will proceed to MBSS in AM for concern of chronic aspiration.   Drooling: Chronic, per patient. Pt attributes to 9 teeth extractions. No other evidence of parkinsonian symptoms.  - MBSS as above.  - Will check CT head. Prior MRI in 2013 showed extensive small vessel infarctions throughout the brain and cerebellum on background of atrophy.   Acute combined HF exacerbation: EF 15-20% by ECHO in Feb 2018. 150lbs on admission, which was discharge weight at last hospitalization. Has significant crackles on exam and pulm edema on CXR w/elevated BNP. Troponins 0.03 x3, no  ACS. - Continue lasix 40 mg IV BID. Repeat exam improving still with significant crackles. XR improved without resolution of edema, still with pleural effusion, though this seems to be improving. - Continue with ACE, spironolactone and coreg.  - Monitor renal function, daily weights, I/O.   Chronic foley catheter:  - Draining green urine without obvious causative medications: Recheck UA and urine culture.  - Gets changed out q3wk at [facility]. Will continue here.   Dementia: Chronic, stable. Continue Aricept. Discussed with step-daughter who is also HCPOA.  COPD: No evidence of exacerbation. Continue nebulizer Tx.  Seizure disorder: Stable. Continue with dilantin.    DVT prophylaxis: Lovenox Code Status: DNR Family Communication: None at bedside.  Disposition Plan: SNF once returned to baseline, currently requiring 4L O2 (above 3L home dependence) and work up for possible chronic aspiration. Anticipate DC in 24 - 48 hours.  Consultants:   None  Procedures:   None  Antimicrobials:  None   Subjective: Dyspnea improving, still coughing up brown sputum occasionally and not at baseline. No fever, chest pain or wheezing.   Objective: Vitals:   07/17/16 1648 07/17/16 1649 07/17/16 2114 07/18/16 0607  BP: (!) 111/58  120/72 119/71  Pulse:   81 76  Resp: 18  20 20   Temp: 98 F (36.7 C)  98.6 F (37 C) 98.8 F (37.1 C)  TempSrc: Oral  Oral Oral  SpO2: 94%  93% 95%  Weight:  64.3 kg (141 lb 12.1 oz)  64.2 kg (141 lb 8.6 oz)  Height:  Intake/Output Summary (Last 24 hours) at 07/18/16 1419 Last data filed at 07/18/16 0617  Gross per 24 hour  Intake              120 ml  Output             1050 ml  Net             -930 ml   Filed Weights   07/17/16 0413 07/17/16 1649 07/18/16 0607  Weight: 63.5 kg (139 lb 15.9 oz) 64.3 kg (141 lb 12.1 oz) 64.2 kg (141 lb 8.6 oz)    Examination: General exam: Pleasant elderly male in no distress. Respiratory system:  Nonlabored on 4L. Crackles bilaterally to bases only. Cardiovascular system: Regular rate and rhythm. No murmur, rub, or gallop. No JVD, and no pedal edema. Gastrointestinal system: Abdomen soft, non-tender, non-distended, with normoactive bowel sounds. No organomegaly or masses felt. GU: Foley in place without irritation. Central nervous system: Alert and conversant. Drooling with mild right facial/lip droop but CN's intact grossly when urged for effort. No focal weakness or sensory impairment. No tremor or rigidity. Extremities: Warm, no deformities Skin: No rashes, lesions no ulcers Psychiatry: Judgement and insight appear impaired. Mood & affect appropriate.   Data Reviewed: I have personally reviewed following labs and imaging studies  CBC:  Recent Labs Lab 07/16/16 0525 07/17/16 0353 07/18/16 0609  WBC 6.6 7.4 6.6  NEUTROABS 4.6  --   --   HGB 11.8* 12.3* 11.9*  HCT 35.6* 37.3* 35.8*  MCV 91.0 90.5 91.6  PLT 198 193 350   Basic Metabolic Panel:  Recent Labs Lab 07/16/16 0525 07/17/16 0353 07/18/16 0609  NA 141 140 139  K 3.6 4.0 4.0  CL 111 109 109  CO2 23 25 22   GLUCOSE 95 85 86  BUN 15 15 20   CREATININE 0.99 1.18 1.14  CALCIUM 8.8* 8.5* 8.6*   GFR: Estimated Creatinine Clearance: 46.1 mL/min (by C-G formula based on SCr of 1.14 mg/dL). Liver Function Tests:  Recent Labs Lab 07/16/16 0525  AST 20  ALT 18  ALKPHOS 66  BILITOT 0.6  PROT 6.5  ALBUMIN 3.8   Cardiac Enzymes:  Recent Labs Lab 07/16/16 0525 07/16/16 1239 07/16/16 1843 07/17/16 0353  TROPONINI 0.03* 0.03* 0.03* 0.03*   Urine analysis:    Component Value Date/Time   COLORURINE RED (A) 07/16/2016 1009   APPEARANCEUR CLOUDY (A) 07/16/2016 1009   LABSPEC 1.005 07/16/2016 1009   PHURINE 5.0 07/16/2016 1009   GLUCOSEU NEGATIVE 07/16/2016 1009   HGBUR SMALL (A) 07/16/2016 1009   BILIRUBINUR NEGATIVE 07/16/2016 1009   KETONESUR NEGATIVE 07/16/2016 1009   PROTEINUR NEGATIVE 07/16/2016  1009   UROBILINOGEN 0.2 08/08/2014 1109   NITRITE NEGATIVE 07/16/2016 1009   LEUKOCYTESUR LARGE (A) 07/16/2016 1009   Recent Results (from the past 240 hour(s))  MRSA PCR Screening     Status: None   Collection Time: 07/16/16  3:55 PM  Result Value Ref Range Status   MRSA by PCR NEGATIVE NEGATIVE Final    Comment:        The GeneXpert MRSA Assay (FDA approved for NASAL specimens only), is one component of a comprehensive MRSA colonization surveillance program. It is not intended to diagnose MRSA infection nor to guide or monitor treatment for MRSA infections.   Culture, expectorated sputum-assessment     Status: None   Collection Time: 07/16/16  6:06 PM  Result Value Ref Range Status   Specimen Description SPUTUM  Final  Special Requests NONE  Final   Sputum evaluation THIS SPECIMEN IS ACCEPTABLE FOR SPUTUM CULTURE  Final   Report Status 07/16/2016 FINAL  Final  Culture, respiratory (NON-Expectorated)     Status: None (Preliminary result)   Collection Time: 07/16/16  6:06 PM  Result Value Ref Range Status   Specimen Description SPUTUM  Final   Special Requests NONE Reflexed from [id number]  Final   Gram Stain   Final    FEW WBC PRESENT, PREDOMINANTLY PMN FEW GRAM POSITIVE COCCI IN PAIRS RARE GRAM POSITIVE COCCI IN CHAINS RARE GRAM NEGATIVE RODS    Culture   Final    CULTURE REINCUBATED FOR BETTER GROWTH Performed at [facility], [place] [address], [place], [place] [address]    Report Status PENDING  Incomplete      Radiology Studies: Dg Chest 2 View  Result Date: 07/18/2016 CLINICAL DATA:  Acute respiratory failure, hypoxia EXAM: CHEST  2 VIEW COMPARISON:  07/16/2016 FINDINGS: Cardiomediastinal silhouette is stable. Small bilateral pleural effusion pre There is mild right perihilar and bilateral basilar streaky interstitial prominence and patchy airspace opacification. Findings suspicious for mild asymmetric pulmonary edema or less likely asymmetric pneumonia.  Clinical correlation is necessary IMPRESSION: Small bilateral pleural effusion pre There is mild right perihilar and bilateral basilar streaky interstitial prominence and patchy airspace opacification. Findings suspicious for mild asymmetric pulmonary edema or less likely asymmetric pneumonia. Clinical correlation is necessary Electronically Signed   By: Lahoma Crocker M.D.   On: 07/18/2016 08:57    Scheduled Meds: . aspirin  81 mg Oral Daily  . atorvastatin  10 mg Oral Daily  . carvedilol  6.25 mg Oral BID WC  . cholecalciferol  1,000 Units Oral Daily  . donepezil  10 mg Oral QHS  . enoxaparin (LOVENOX) injection  40 mg Subcutaneous Q24H  . fluticasone furoate-vilanterol  1 puff Inhalation Q breakfast  . furosemide  40 mg Intravenous Q12H  . guaiFENesin  600 mg Oral BID  . losartan  25 mg Oral Daily  . mirabegron ER  50 mg Oral Q breakfast  . phenytoin  200 mg Oral BID  . potassium chloride  40 mEq Oral Daily  . sodium chloride flush  3 mL Intravenous Q12H  . sodium chloride flush  3 mL Intravenous Q12H  . spironolactone  25 mg Oral q morning - 10a   Continuous Infusions: . sodium chloride       LOS: 2 days   Time spent: 25 minutes.  Vance Gather, MD [name] Pager [phone]  If 7PM-7AM, please contact night-coverage [email or web address] Password [email or web address] 07/18/2016, 2:19 PM

## 2016-07-18 NOTE — Progress Notes (Signed)
Exchanged foley collection bag so that a UA and Urine culture can be obtained per MD orders. Patient's current bag had no method of collection other than from the emptying port. Changed bag to be able to get a sample and so that sample will be as clean as possible. Waiting for urine to collect in tube so that a sample can be collected for lab.

## 2016-07-18 NOTE — Care Management Note (Signed)
Case Management Note  Patient Details  Name: CLOYS VERA MRN: [id number] Date of Birth: Jul 02, 1932  Subjective/Objective: Transfer from SDU. From ALF [facility].PT recc SNF-CSW notified.                   Action/Plan:d/c SNF.   Expected Discharge Date:                  Expected Discharge Plan:  Skilled Nursing Facility  In-House Referral:  Clinical Social Work  Discharge planning Services  CM Consult  Post Acute Care Choice:    Choice offered to:     DME Arranged:    DME Agency:     HH Arranged:    [place] Agency:     Status of Service:  In process, will continue to follow  If discussed at Long Length of Stay Meetings, dates discussed:    Additional Comments:  Dessa Phi, RN 07/18/2016, 1:05 PM

## 2016-07-19 ENCOUNTER — Inpatient Hospital Stay (HOSPITAL_COMMUNITY): Payer: Medicare Other

## 2016-07-19 DIAGNOSIS — I10 Essential (primary) hypertension: Secondary | ICD-10-CM

## 2016-07-19 LAB — CBC
HEMATOCRIT: 36.4 % — AB (ref 39.0–52.0)
Hemoglobin: 11.8 g/dL — ABNORMAL LOW (ref 13.0–17.0)
MCH: 29.3 pg (ref 26.0–34.0)
MCHC: 32.4 g/dL (ref 30.0–36.0)
MCV: 90.3 fL (ref 78.0–100.0)
Platelets: 190 10*3/uL (ref 150–400)
RBC: 4.03 MIL/uL — ABNORMAL LOW (ref 4.22–5.81)
RDW: 13.2 % (ref 11.5–15.5)
WBC: 5.9 10*3/uL (ref 4.0–10.5)

## 2016-07-19 LAB — BASIC METABOLIC PANEL
Anion gap: 6 (ref 5–15)
BUN: 17 mg/dL (ref 6–20)
CHLORIDE: 108 mmol/L (ref 101–111)
CO2: 25 mmol/L (ref 22–32)
Calcium: 8.7 mg/dL — ABNORMAL LOW (ref 8.9–10.3)
Creatinine, Ser: 1.12 mg/dL (ref 0.61–1.24)
GFR calc Af Amer: >60 mL/min (ref >60)
GFR calc non Af Amer: 60 mL/min — ABNORMAL LOW (ref >60)
GLUCOSE: 85 mg/dL (ref 65–99)
POTASSIUM: 4 mmol/L (ref 3.5–5.1)
Sodium: 139 mmol/L (ref 135–145)

## 2016-07-19 LAB — CULTURE, RESPIRATORY W GRAM STAIN: Culture: NORMAL

## 2016-07-19 LAB — CULTURE, RESPIRATORY

## 2016-07-19 MED ORDER — DEXTROSE 5 % IV SOLN
1.0000 g | Freq: Two times a day (BID) | INTRAVENOUS | Status: DC
  Administered 2016-07-19 – 2016-07-20 (×3): 1 g via INTRAVENOUS
  Filled 2016-07-19 (×4): qty 1

## 2016-07-19 MED ORDER — GLYCOPYRROLATE 1 MG PO TABS
1.0000 mg | ORAL_TABLET | Freq: Two times a day (BID) | ORAL | Status: DC
  Administered 2016-07-19 – 2016-07-20 (×3): 1 mg via ORAL
  Filled 2016-07-19 (×3): qty 1

## 2016-07-19 MED ORDER — FUROSEMIDE 10 MG/ML IJ SOLN
80.0000 mg | Freq: Two times a day (BID) | INTRAMUSCULAR | Status: DC
  Administered 2016-07-19 – 2016-07-20 (×3): 80 mg via INTRAVENOUS
  Filled 2016-07-19 (×3): qty 8

## 2016-07-19 NOTE — Progress Notes (Signed)
CSW followed up with patient's daughter Jorge Mandril) about bed offers provided yesterday 07/18/2016. Patient's daughter reported that she is interested in [facility]. CSW confirmed bed availability at [facility] with staff member Florentina Jenny. CSW will continue to follow to assist with discharge planning for SNF.

## 2016-07-19 NOTE — Progress Notes (Signed)
Physical Therapy Treatment Patient Details Name: [name] MRN: [id number] DOB: 1932-10-15 Today's Date: 07/19/2016    History of Present Illness 83 y.o. 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 He presents today with increase SOB, productive cough and increase oxygen requirement.   patient found to by hypoxic, requiring 6 L of Oxygen. Chest x ray with Bilateral pulmonary edema, BNP: 1572    PT Comments    Pt OOB in recliner on 3 lts at 100%.  Assisted with amb a great distance in hallway sats decreased to 80% RA(trial) then  Reapplied 3 lts sats avg 90% with activity.  Mild dyspnea.  Feeling "good".  Hopes to return to [place] so he can sit on their front porch and watch all the people come and go.   Follow Up Recommendations  SNF;Supervision/Assistance - 24 hour (ALF)     Equipment Recommendations       Recommendations for Other Services       Precautions / Restrictions Precautions Precautions: Fall Precaution Comments: Chronic O2 3 lts at ALF  Restrictions Weight Bearing Restrictions: No    Mobility  Bed Mobility Overal bed mobility: Needs Assistance Bed Mobility: Supine to Sit     Supine to sit: Supervision     General bed mobility comments: OOB in recliner  Transfers Overall transfer level: Needs assistance Equipment used: Rolling walker (2 wheeled) Transfers: Sit to/from [facility] Sit to Stand: Min guard Stand pivot transfers: Min guard       General transfer comment: steady assist upon standing up  Ambulation/Gait Ambulation/Gait assistance: Min assist Ambulation Distance (Feet): 75 Feet Assistive device: Rolling walker (2 wheeled) Gait Pattern/deviations: Step-through pattern;Narrow base of support;Shuffle Gait  velocity: decreased   General Gait Details: gait unsteady, shuffling    Stairs            Wheelchair Mobility    Modified Rankin (Stroke Patients Only)       Balance                                            Cognition Arousal/Alertness: Awake/alert Behavior During Therapy: WFL for tasks assessed/performed Overall Cognitive Status: Within Functional Limits for tasks assessed                                        Exercises      General Comments        Pertinent Vitals/Pain Pain Assessment: No/denies pain    Home Living                      Prior Function            PT Goals (current goals can now be found in the care plan section) Progress towards PT goals: Progressing toward goals    Frequency    Min 3X/week      PT Plan Current plan remains appropriate    Co-evaluation             End of Session Equipment Utilized During Treatment: Gait belt Activity Tolerance: Patient tolerated treatment well  Patient left: in chair;with call bell/phone within reach;with chair alarm set Nurse Communication: Mobility status PT Visit Diagnosis: Difficulty in walking, not elsewhere classified (R26.2);Muscle weakness (generalized) (M62.81)     Time: 2111-7356 PT Time Calculation (min) (ACUTE ONLY): 17 min  Charges:  $Gait Training: 8-22 mins                    G Codes:       Rica Koyanagi  PTA [facility]  Acute  Rehab Pager      [phone]

## 2016-07-19 NOTE — Progress Notes (Signed)
Occupational Therapy Treatment Patient Details Name: [name] MRN: [id number] DOB: 02/08/1933 Today's Date: 07/19/2016    History of present illness 83 y.o. 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 He presents today with increase SOB, productive cough and increase oxygen requirement.   patient found to by hypoxic, requiring 6 L of Oxygen. Chest x ray with Bilateral pulmonary edema, BNP: 1572   OT comments  Pt much improved this day- may be able to go back to his ALF rather than SNF  Follow Up Recommendations  Home health OT;Other (comment);SNF (HH at ALF or SNF depending on progress)    Equipment Recommendations  None recommended by OT    Recommendations for Other Services      Precautions / Restrictions Precautions Precautions: Fall Restrictions Weight Bearing Restrictions: No       Mobility Bed Mobility Overal bed mobility: Needs Assistance Bed Mobility: Supine to Sit     Supine to sit: Supervision        Transfers Overall transfer level: Needs assistance Equipment used: Rolling walker (2 wheeled) Transfers: Sit to/from [facility] Sit to Stand: Min guard Stand pivot transfers: Min guard       General transfer comment: steady assist upon standing up        ADL either performed or assessed with clinical judgement   ADL Overall ADL's : Needs assistance/impaired Eating/Feeding: Set up;Sitting   Grooming: Set up;Sitting   Upper Body Bathing: Sitting;Set up   Lower Body Bathing: Sit to/from stand;Cueing for safety;Minimal assistance   Upper Body Dressing : Minimal assistance;Sitting   Lower Body Dressing: Sit to/from stand;Cueing for safety;Minimal assistance   Toilet Transfer: RW;Ambulation;Minimal assistance Toilet  Transfer Details (indicate cue type and reason): chair to chair Toileting- Clothing Manipulation and Hygiene: Sit to/from stand;Cueing for safety;Min guard         General ADL Comments: pt performed bath and  did well! pt much improved this day               Cognition Arousal/Alertness: Awake/alert Behavior During Therapy: WFL for tasks assessed/performed Overall Cognitive Status: Within Functional Limits for tasks assessed                                                     Pertinent Vitals/ Pain       Pain Assessment: No/denies pain         Frequency  Min 2X/week        Progress Toward Goals  OT Goals(current goals can now be found in the care plan section)  Progress towards OT goals: Progressing toward goals     Plan Discharge plan needs to be updated    Co-evaluation                 End of Session Equipment Utilized During Treatment: Rolling walker;Oxygen  OT Visit Diagnosis: Unsteadiness on feet (R26.81);Muscle weakness (generalized) (M62.81);Other abnormalities of gait and mobility (R26.89)   Activity Tolerance Patient tolerated treatment well   Patient Left in chair   Nurse Communication Mobility status        Time: 1100-1129 OT Time Calculation (min): 29 min  Charges: OT General Charges $OT Visit: 1 Procedure OT Treatments $Self Care/Home Management : 23-37 mins  [place], [place]   Betsy Pries 07/19/2016, 1:30 PM

## 2016-07-19 NOTE — Progress Notes (Signed)
PROGRESS NOTE  [name]  GNF:[id number] DOB: [birth year] DOA: 07/16/2016 PCP: Tivis Ringer, MD   Brief Narrative: [name] is an 83 y.o. male with medical history significant of combined CHF (EF 15-20%, seizure disorder, and COPD on 3L home oxygen, recently discharged 3/16 after HCAP Tx, who presented from ALF with dyspnea and increased oxygen requirement, up to 6L by [place] on arrival. His POA reports he does not follow low salt diet and doubts he can return to ALF. PT evaluation recommended SNF.   CXR showed bilateral pulmonary edema. BNP was elevated to 1572. IV lasix was started with improvement in respiratory status. He will be transferred to medical floor 4/16 and diuresis ongoing.   Assessment & Plan: Active Problems:   Hypertension   Seizure disorder (HCC)   CAD (coronary artery disease)   Acute on chronic combined systolic and diastolic CHF (congestive heart failure) (HCC)   Dementia   Heart failure (HCC)   Acute on chronic respiratory failure with hypoxemia (HCC)  Acute hypoxic respiratory failure: Likely related to CHF exacerbation. Chest x ray with patchy bilateral airspace diseases, Pulmonary edema vs PNA. Elevated BNP at 1500.  - Wean oxygen as able with diuresis. Per pt's HCPOA he only qualified for 1LPM previously, but was increasing to 3LPM due to anxiety.  to home dependence of 3LPM.  - SLP suspects possible chronic aspiration, will continue to hold abx at this time as there is no fever or leukocytosis and negative sputum culture.   Acute combined CHF exacerbation and ischemic cardiomyopathy/CAD s/p LAD stenting: EF 15-20% by ECHO in Feb 2018. 150lbs on admission, which was discharge weight at last hospitalization. Has significant crackles on exam and pulm edema on CXR w/elevated BNP. Troponins 0.03 x3, no ACS. - Increase lasix to 80mg  IV BID as weight continues to trend upward.  - Repeat CXR in AM. - Continue with ACE, spironolactone and coreg.  - Monitor  renal function, daily weights, I/O.   Urinary retention with chronic indwelling foley and CA-UTI: Pt noted to have green urine output 4/17. UA from new foley showed nitrite+ infection.  - Monitor urine culture - Continue abx with pseudomonas coverage given frequent hospitalizations, green discoloration.  - Foley usually changed out q3wk at [facility]. Changed 4/17.  Dysphagia: With chronic drooling per patient attributes to 9 teeth extractions. No other evidence of parkinsonian symptoms. CT head without acute CVA on background of CVD.  - MBSS as above shows moderate oral and mild pharyngeal dysphagia, suspect he could be chronically aspirating.   Indeterminate lesion in left internal carotid artery seen on CT 4/17. Discussed with neurology, Dr. Shon Hale, who recommended CTA but has dye allergy. MRA showed no stenosis of carotid artery, so this was likely calcification.  - Continue ASA 81mg   Dementia: Chronic, stable. Continue Aricept. Discussed with step-daughter who is also HCPOA.   COPD: No evidence of exacerbation. Continue nebulizer Tx.   Seizure disorder: Stable. Continue with dilantin.   DVT prophylaxis: Lovenox Code Status: DNR Family Communication: None at bedside.  Disposition Plan: SNF once returned to baseline, currently requiring 4L O2 (above home requirement) and work up for possible chronic aspiration. Anticipate DC in 24 hours if clinically responsive to higher lasix dose.  Consultants:   None  Procedures:   None  Antimicrobials:  None   Subjective: Dyspnea not back to baseline, worse when supine. No fever, chest pain or wheezing.   Objective: Vitals:   07/18/16 2004 07/19/16 0425 07/19/16 0426 07/19/16  1231  BP: 122/69 107/71  97/63  Pulse: 78 62  68  Resp: 20 19  20   Temp: 97.4 F (36.3 C) 98.5 F (36.9 C)  98.3 F (36.8 C)  TempSrc: Oral Oral  Oral  SpO2: 97% 97%  96%  Weight:   65.1 kg (143 lb 8.3 oz)   Height:        Intake/Output Summary  (Last 24 hours) at 07/19/16 1255 Last data filed at 07/19/16 1200  Gross per 24 hour  Intake              480 ml  Output             1700 ml  Net            -1220 ml   Filed Weights   07/17/16 1649 07/18/16 0607 07/19/16 0426  Weight: 64.3 kg (141 lb 12.1 oz) 64.2 kg (141 lb 8.6 oz) 65.1 kg (143 lb 8.3 oz)    Examination: General exam: Pleasant elderly male in no distress. Respiratory system: Nonlabored on 4L. Crackles bilaterally to bases  Cardiovascular system: Regular rate and rhythm. No murmur, rub, or gallop. No JVD, and no pedal edema. Gastrointestinal system: Abdomen soft, non-tender, non-distended, with normoactive bowel sounds. No organomegaly or masses felt. GU: Foley in place without irritation. Central nervous system: Alert and conversant. Drooling with mild right lip droop but CN's intact grossly when prompted. No focal weakness or sensory impairment. No tremor or rigidity. Extremities: Warm, no deformities Skin: No rashes, lesions no ulcers Psychiatry: Judgement and insight appear impaired. Mood & affect appropriate.   Data Reviewed: I have personally reviewed following labs and imaging studies  CBC:  Recent Labs Lab 07/16/16 0525 07/17/16 0353 07/18/16 0609 07/19/16 0629  WBC 6.6 7.4 6.6 5.9  NEUTROABS 4.6  --   --   --   HGB 11.8* 12.3* 11.9* 11.8*  HCT 35.6* 37.3* 35.8* 36.4*  MCV 91.0 90.5 91.6 90.3  PLT 198 193 196 938   Basic Metabolic Panel:  Recent Labs Lab 07/16/16 0525 07/17/16 0353 07/18/16 0609 07/19/16 0629  NA 141 140 139 139  K 3.6 4.0 4.0 4.0  CL 111 109 109 108  CO2 23 25 22 25   GLUCOSE 95 85 86 85  BUN 15 15 20 17   CREATININE 0.99 1.18 1.14 1.12  CALCIUM 8.8* 8.5* 8.6* 8.7*   GFR: Estimated Creatinine Clearance: 47.6 mL/min (by C-G formula based on SCr of 1.12 mg/dL). Liver Function Tests:  Recent Labs Lab 07/16/16 0525  AST 20  ALT 18  ALKPHOS 66  BILITOT 0.6  PROT 6.5  ALBUMIN 3.8   Cardiac Enzymes:  Recent  Labs Lab 07/16/16 0525 07/16/16 1239 07/16/16 1843 07/17/16 0353  TROPONINI 0.03* 0.03* 0.03* 0.03*   Urine analysis:    Component Value Date/Time   COLORURINE YELLOW 07/18/2016 1859   APPEARANCEUR CLOUDY (A) 07/18/2016 1859   LABSPEC 1.025 07/18/2016 1859   PHURINE 5.0 07/18/2016 1859   GLUCOSEU NEGATIVE 07/18/2016 1859   HGBUR MODERATE (A) 07/18/2016 1859   BILIRUBINUR SMALL (A) 07/18/2016 1859   KETONESUR NEGATIVE 07/18/2016 1859   PROTEINUR 30 (A) 07/18/2016 1859   UROBILINOGEN 0.2 08/08/2014 1109   NITRITE POSITIVE (A) 07/18/2016 1859   LEUKOCYTESUR LARGE (A) 07/18/2016 1859   Recent Results (from the past 240 hour(s))  MRSA PCR Screening     Status: None   Collection Time: 07/16/16  3:55 PM  Result Value Ref Range Status   MRSA by  PCR NEGATIVE NEGATIVE Final    Comment:        The GeneXpert MRSA Assay (FDA approved for NASAL specimens only), is one component of a comprehensive MRSA colonization surveillance program. It is not intended to diagnose MRSA infection nor to guide or monitor treatment for MRSA infections.   Culture, expectorated sputum-assessment     Status: None   Collection Time: 07/16/16  6:06 PM  Result Value Ref Range Status   Specimen Description SPUTUM  Final   Special Requests NONE  Final   Sputum evaluation THIS SPECIMEN IS ACCEPTABLE FOR SPUTUM CULTURE  Final   Report Status 07/16/2016 FINAL  Final  Culture, respiratory (NON-Expectorated)     Status: None   Collection Time: 07/16/16  6:06 PM  Result Value Ref Range Status   Specimen Description SPUTUM  Final   Special Requests NONE Reflexed from [id number]  Final   Gram Stain   Final    FEW WBC PRESENT, PREDOMINANTLY PMN FEW GRAM POSITIVE COCCI IN PAIRS RARE GRAM POSITIVE COCCI IN CHAINS RARE GRAM NEGATIVE RODS    Culture   Final    Consistent with normal respiratory flora. Performed at [facility], [place] [address], [place], [place] [address]    Report Status 07/19/2016 FINAL   Final      Radiology Studies: Dg Chest 2 View  Result Date: 07/18/2016 CLINICAL DATA:  Acute respiratory failure, hypoxia EXAM: CHEST  2 VIEW COMPARISON:  07/16/2016 FINDINGS: Cardiomediastinal silhouette is stable. Small bilateral pleural effusion pre There is mild right perihilar and bilateral basilar streaky interstitial prominence and patchy airspace opacification. Findings suspicious for mild asymmetric pulmonary edema or less likely asymmetric pneumonia. Clinical correlation is necessary IMPRESSION: Small bilateral pleural effusion pre There is mild right perihilar and bilateral basilar streaky interstitial prominence and patchy airspace opacification. Findings suspicious for mild asymmetric pulmonary edema or less likely asymmetric pneumonia. Clinical correlation is necessary Electronically Signed   By: Lahoma Crocker M.D.   On: 07/18/2016 08:57   Ct Head Wo Contrast  Result Date: 07/18/2016 CLINICAL DATA:  83 year old male with right facial droop. Initial encounter. EXAM: CT HEAD WITHOUT CONTRAST TECHNIQUE: Contiguous axial images were obtained from the base of the skull through the vertex without intravenous contrast. COMPARISON:  04/10/2011 brain MR.  04/09/2011 head CT. FINDINGS: Brain: No intracranial hemorrhage. Hyperdense ectatic left internal carotid artery supraclinoid segment/carotid terminus. The hyperdensity may be related to calcifications although different than on prior CT and thrombus therefore not excluded. No other findings of large acute infarct. Prominent chronic microvascular changes. Remote right corona radiata infarct. Moderate global atrophy without hydrocephalus. No intracranial mass lesion noted on this unenhanced exam. Vascular: Vascular calcifications with ectatic left internal carotid artery supraclinoid segment Skull: No acute abnormality. Sinuses/Orbits: Post lens replacement without acute orbital abnormality. Opacification right frontal sinus and mid to anterior right  ethmoid sinus air cells. Other: Mastoid air cells and middle ear cavities are clear. IMPRESSION: No intracranial hemorrhage. Hyperdense left internal carotid artery supraclinoid segment. Cannot exclude thrombus. Prominent chronic microvascular changes. Remote right corona radiata infarct. Moderate global atrophy without hydrocephalus. Opacification right frontal sinus and mid to anterior right ethmoid sinus air cells. These results will be called to the ordering clinician or representative by the Radiologist Assistant, and communication documented in the PACS or zVision Dashboard. Electronically Signed   By: Genia Del M.D.   On: 07/18/2016 15:45   Mr Jodene Nam Head Wo Contrast  Result Date: 07/18/2016 CLINICAL DATA:  Left  internal carotid artery stenosis. EXAM: MRA HEAD WITHOUT CONTRAST TECHNIQUE: Angiographic images of the Circle of Willis were obtained using MRA technique without intravenous contrast. COMPARISON:  CT head 07/18/2016 FINDINGS: Image quality degraded by mild motion. Both vertebral arteries contribute to the basilar and are patent without stenosis. Basilar is tortuous but widely patent. Posterior cerebral arteries are patent bilaterally with a moderate stenosis proximal left posterior cerebral artery. Internal carotid artery widely patent bilaterally with tortuosity but no significant stenosis. Anterior cerebral artery patent bilaterally. Mild stenosis right M1 segment. Left M1 segment patent without significant stenosis. Note is made of calcification in the left M1 segment on CT. Middle cerebral artery branches not well visualized due to motion and small-vessel size. IMPRESSION: Moderate stenosis proximal left posterior cerebral artery. Mild stenosis right M1 segment.  Left M1 segment widely patent. Electronically Signed   By: Franchot Gallo M.D.   On: 07/18/2016 19:09   Dg Swallowing Func-speech Pathology  Result Date: 07/19/2016 Objective Swallowing Evaluation: Type of Study: MBS-Modified  Barium Swallow Study Patient Details Name: KAAN TOSH MRN: [id number] Date of Birth: [birth year] Today's Date: 07/19/2016 Time: SLP Start Time (ACUTE ONLY): 0809-SLP Stop Time (ACUTE ONLY): 0838 SLP Time Calculation (min) (ACUTE ONLY): 29 min Past Medical History: Past Medical History: Diagnosis Date . Acute MI [facility]) Dec 2010  cardiac catheriziation and stenting of the LAD (bare metal) 03/14/09             . Anemia  . Angina  . Blood transfusion  . CAD (coronary artery disease)  . COPD (chronic obstructive pulmonary disease) ([place])  . DEMENTIA  . High cholesterol  . Hypertension  . LV dysfunction   EF 25 to 30% per echo May 2012 . NSTEMI (non-ST elevated myocardial infarction) [facility]) Nov 2011  with LV dysfunction . Prostatic hypertrophy  . Seizures ([place]) ~ 2006  "when blood pressure went up too high"; on Dilantin . Urethral stricture   with subsequent trauma and has an indwelling Foley catheter in place during his first MI                                                . Urinary catheter in place   chronic . Vision impairment   "legally blind" Past Surgical History: Past Surgical History: Procedure Laterality Date . CARDIAC CATHETERIZATION  02/2010 . CATARACT EXTRACTION    "both eyes; when I was small; I was born w/cataracrs" . CHOLECYSTECTOMY  05/30/11 . CHOLECYSTECTOMY  05/30/2011  Procedure: LAPAROSCOPIC CHOLECYSTECTOMY;  Surgeon: Judieth Keens, DO;  Location: [place];  Service: General;  Laterality: N/A; . CORONARY ANGIOPLASTY WITH STENT PLACEMENT  03/2009  "1"; stent to LAD . TONSILLECTOMY    "when I was small" HPI: 83 y.o. 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 He presents today with increase SOB, productive cough and increase oxygen requirement. He denies chest pain.  Pt underwent MRI showing  neurological involvement.   Subjective: pt awake in chair Assessment / Plan / Recommendation CHL IP CLINICAL IMPRESSIONS 07/19/2016 Clinical Impression Moderate oral and mild pharyngeal dysphagia characterized by decreased oral coordination, lingual pumping and premature spillage of boluses into pharynx with mild oral  residuals. Secretions retained in oral cavity mix with barium and spill into pharynx without pt awareness to clear.  Cued swallows helpful to clear portion of oral secretions.  Laryngeal penetration of thin to cords noted due to decreased timing of laryngeal closure.  Subtle throat clear removed barium from cords but suspect chronic low grade aspiration of thin and secretions.  Of not, pt also cleared his throat without laryngeal penetration of barium observed - presume secretion aspiration/penetration as source.  Use of chin tuck posture prevented penetration.  Recommend continue dys3/thin with precautions including chin tuck with liquids and dry swallows.  Using live video, educated pt to findings/recommendations and provided in writing.  Pt reports his "reading is not too good".  Will follow up for dysphagia management.  SLP Visit Diagnosis Dysphagia, oropharyngeal phase (R13.12) Attention and concentration deficit following -- Frontal lobe and executive function deficit following -- Impact on safety and function Moderate aspiration risk   CHL IP TREATMENT RECOMMENDATION 07/19/2016 Treatment Recommendations Therapy as outlined in treatment plan below   Prognosis 07/19/2016 Prognosis for Safe Diet Advancement Good Barriers to Reach Goals -- Barriers/Prognosis Comment -- CHL IP DIET RECOMMENDATION 07/19/2016 SLP Diet Recommendations Dysphagia 3 (Mech soft) solids;Thin liquid Liquid Administration via Straw Medication Administration Whole meds with puree Compensations Slow rate;Small sips/bites;Use straw to facilitate chin tuck;Chin tuck;Other (Comment) Postural Changes Seated upright at 90 degrees;Remain  semi-upright after after feeds/meals (Comment)   CHL IP OTHER RECOMMENDATIONS 07/19/2016 Recommended Consults -- Oral Care Recommendations Oral care QID Other Recommendations --   CHL IP FOLLOW UP RECOMMENDATIONS 07/19/2016 Follow up Recommendations Skilled Nursing facility   [facility] IP FREQUENCY AND DURATION 07/19/2016 Speech Therapy Frequency (ACUTE ONLY) min 2x/week Treatment Duration 2 weeks      CHL IP ORAL PHASE 07/19/2016 Oral Phase -- Oral - Pudding Teaspoon -- Oral - Pudding Cup -- Oral - Honey Teaspoon -- Oral - Honey Cup -- Oral - Nectar Teaspoon -- Oral - Nectar Cup Weak lingual manipulation;Decreased bolus cohesion;Premature spillage;Lingual pumping Oral - Nectar Straw -- Oral - Thin Teaspoon -- Oral - Thin Cup Weak lingual manipulation;Premature spillage;Decreased bolus cohesion;Lingual pumping Oral - Thin Straw Weak lingual manipulation;Lingual pumping;Piecemeal swallowing;Premature spillage;Decreased bolus cohesion Oral - Puree Weak lingual manipulation;Delayed oral transit;Lingual pumping;Premature spillage Oral - Mech Soft Weak lingual manipulation;Impaired mastication;Delayed oral transit;Lingual pumping;Decreased bolus cohesion;Premature spillage Oral - Regular -- Oral - Multi-Consistency -- Oral - Pill Weak lingual manipulation;Lingual pumping;Premature spillage;Decreased bolus cohesion Oral Phase - Comment --  CHL IP PHARYNGEAL PHASE 07/19/2016 Pharyngeal Phase Impaired Pharyngeal- Pudding Teaspoon -- Pharyngeal -- Pharyngeal- Pudding Cup -- Pharyngeal -- Pharyngeal- Honey Teaspoon -- Pharyngeal -- Pharyngeal- Honey Cup -- Pharyngeal -- Pharyngeal- Nectar Teaspoon -- Pharyngeal -- Pharyngeal- Nectar Cup Pharyngeal residue - pyriform Pharyngeal -- Pharyngeal- Nectar Straw -- Pharyngeal -- Pharyngeal- Thin Teaspoon -- Pharyngeal -- Pharyngeal- Thin Cup Pharyngeal residue - pyriform;Penetration/Aspiration during swallow Pharyngeal Material enters airway, CONTACTS cords and then ejected out Pharyngeal- Thin  Straw Pharyngeal residue - pyriform Pharyngeal -- Pharyngeal- Puree Pharyngeal residue - pyriform Pharyngeal -- Pharyngeal- Mechanical Soft Pharyngeal residue - pyriform Pharyngeal -- Pharyngeal- Regular -- Pharyngeal -- Pharyngeal- Multi-consistency -- Pharyngeal -- Pharyngeal- Pill Pharyngeal residue - pyriform Pharyngeal -- Pharyngeal Comment --  CHL IP CERVICAL ESOPHAGEAL PHASE 07/19/2016 Cervical Esophageal Phase WFL Pudding Teaspoon -- Pudding Cup -- Honey Teaspoon -- Honey Cup -- Nectar Teaspoon -- Nectar Cup -- Nectar Straw -- Thin Teaspoon -- Thin Cup -- Thin Straw -- Puree -- Mechanical Soft -- Regular -- Multi-consistency --  Pill -- Cervical Esophageal Comment -- CHL IP GO 07/16/2016 Functional Assessment Tool Used (No Data) Functional Limitations Swallowing Swallow Current Status (F1886) CI Swallow Goal Status (L7373) CI Swallow Discharge Status (G6815) (None) Motor Speech Current Status [phone]) (None) Motor Speech Goal Status (A1518) (None) Motor Speech Goal Status (D4373) (None) Spoken Language Comprehension Current Status (H7897) (None) Spoken Language Comprehension Goal Status (O4784) (None) Spoken Language Comprehension Discharge Status [phone]) (None) Spoken Language Expression Current Status [phone]) (None) Spoken Language Expression Goal Status [phone]) (None) Spoken Language Expression Discharge Status [phone]) (None) Attention Current Status (Z5015) (None) Attention Goal Status (A6825) (None) Attention Discharge Status [phone]) (None) Memory Current Status (L2174) (None) Memory Goal Status (J1595) (None) Memory Discharge Status (Z9672) (None) Voice Current Status (W9791) (None) Voice Goal Status (R0413) (None) Voice Discharge Status (S4383) (None) Other Speech-Language Pathology Functional Limitation Current Status (J7939) (None) Other Speech-Language Pathology Functional Limitation Goal Status (S8864) (None) Other Speech-Language Pathology Functional Limitation Discharge Status [phone]) (None)  Macario Golds 07/19/2016, 8:49 AM               Scheduled Meds: . aspirin  81 mg Oral Daily  . atorvastatin  10 mg Oral Daily  . carvedilol  6.25 mg Oral BID WC  . cholecalciferol  1,000 Units Oral Daily  . donepezil  10 mg Oral QHS  . enoxaparin (LOVENOX) injection  40 mg Subcutaneous Q24H  . fluticasone furoate-vilanterol  1 puff Inhalation Q breakfast  . furosemide  80 mg Intravenous Q12H  . guaiFENesin  600 mg Oral BID  . losartan  25 mg Oral Daily  . mirabegron ER  50 mg Oral Q breakfast  . phenytoin  200 mg Oral BID  . potassium chloride  40 mEq Oral Daily  . sodium chloride flush  3 mL Intravenous Q12H  . sodium chloride flush  3 mL Intravenous Q12H  . spironolactone  25 mg Oral q morning - 10a   Continuous Infusions: . sodium chloride    . cefTAZidime (FORTAZ)  IV Stopped (07/19/16 1116)     LOS: 3 days   Time spent: 25 minutes.  Vance Gather, MD [name] Pager [phone]  If 7PM-7AM, please contact night-coverage [email or web address] Password [facility] 07/19/2016, 12:55 PM

## 2016-07-19 NOTE — Progress Notes (Signed)
  Pharmacy Antibiotic Note  [name] is a 83 y.o. male admitted on 07/16/2016 with concern for pseudomonas UTI.  Pharmacy has been consulted for ceftazidime dosing.  Plan:  Ceftazidime 1gm IV q12h based on current renal function  Follow renal function  Follow cultures  Height: 5\' 8"  (172.7 cm) Weight: 143 lb 8.3 oz (65.1 kg) IBW/kg (Calculated) : 68.4  Temp (24hrs), Avg:98 F (36.7 C), Min:97.4 F (36.3 C), Max:98.5 F (36.9 C)   Recent Labs Lab 07/16/16 0525 07/17/16 0353 07/18/16 0609 07/19/16 0629  WBC 6.6 7.4 6.6 5.9  CREATININE 0.99 1.18 1.14 1.12    Estimated Creatinine Clearance: 47.6 mL/min (by C-G formula based on SCr of 1.12 mg/dL).    Allergies  Allergen Reactions  . Red Dye Other (See Comments)    Per Mar.  . Iodinated Diagnostic Agents Rash    Pt given 50 mg Benadryl prior to Omnipaque injection, tolerated well.    Antimicrobials this admission:  4/18 ceftazidime >>  Dose adjustments this admission:   Microbiology results:  4/15 sputum: re-incub 4/15 MRSA PCR: neg 4/17 UCx:   Thank you for allowing pharmacy to be a part of this patient's care.  Doreene Eland, PharmD, BCPS.   Pager: [phone] 07/19/2016 7:54 AM

## 2016-07-19 NOTE — Progress Notes (Signed)
Modified Barium Swallow Progress Note  Patient Details  Name: [name] MRN: [id number] Date of Birth: [birth year]  Today's Date: 07/19/2016  Modified Barium Swallow completed.  Full report located under Chart Review in the Imaging Section.  Brief recommendations include the following:  Clinical Impression  Moderate oral and mild pharyngeal dysphagia characterized by decreased oral coordination, lingual pumping and premature spillage of boluses into pharynx with mild oral residuals. Secretions retained in oral cavity mix with barium and spill into pharynx without pt awareness to clear.  Cued swallows helpful to clear portion of oral secretions.  Laryngeal penetration of thin to cords noted due to decreased timing of laryngeal closure.  Subtle throat clear removed barium from cords but suspect chronic low grade aspiration of thin and secretions.  Of not, pt also cleared his throat without laryngeal penetration of barium observed - presume secretion aspiration/penetration as source.  Use of chin tuck posture prevented penetration.  Recommend continue dys3/thin with precautions including chin tuck with liquids and dry swallows.  Using live video, educated pt to findings/recommendations and provided in writing.  Pt reports his "reading is not too good".  Will follow up for dysphagia management.    Swallow Evaluation Recommendations       SLP Diet Recommendations: Dysphagia 3 (Mech soft) solids;Thin liquid   Liquid Administration via: Straw   Medication Administration: Whole meds with puree   Supervision: Patient able to self feed   Compensations: Slow rate;Small sips/bites;Use straw to facilitate chin tuck;Chin tuck;Other (Comment) (intermittent dry swallow)   Postural Changes: Seated upright at 90 degrees;Remain semi-upright after after feeds/meals (Comment)   Oral Care Recommendations: Oral care QID        Luanna Salk, [facility] SLP [phone]

## 2016-07-20 ENCOUNTER — Inpatient Hospital Stay (HOSPITAL_COMMUNITY): Payer: Medicare Other

## 2016-07-20 DIAGNOSIS — J9601 Acute respiratory failure with hypoxia: Secondary | ICD-10-CM

## 2016-07-20 DIAGNOSIS — I679 Cerebrovascular disease, unspecified: Secondary | ICD-10-CM

## 2016-07-20 LAB — BASIC METABOLIC PANEL
ANION GAP: 7 (ref 5–15)
BUN: 16 mg/dL (ref 6–20)
CALCIUM: 9 mg/dL (ref 8.9–10.3)
CO2: 27 mmol/L (ref 22–32)
Chloride: 103 mmol/L (ref 101–111)
Creatinine, Ser: 1.18 mg/dL (ref 0.61–1.24)
GFR, EST NON AFRICAN AMERICAN: 56 mL/min — AB (ref >60)
GLUCOSE: 85 mg/dL (ref 65–99)
POTASSIUM: 4.2 mmol/L (ref 3.5–5.1)
Sodium: 137 mmol/L (ref 135–145)

## 2016-07-20 LAB — CBC
HEMATOCRIT: 37.6 % — AB (ref 39.0–52.0)
HEMOGLOBIN: 12.4 g/dL — AB (ref 13.0–17.0)
MCH: 30.4 pg (ref 26.0–34.0)
MCHC: 33 g/dL (ref 30.0–36.0)
MCV: 92.2 fL (ref 78.0–100.0)
Platelets: 195 10*3/uL (ref 150–400)
RBC: 4.08 MIL/uL — ABNORMAL LOW (ref 4.22–5.81)
RDW: 13.1 % (ref 11.5–15.5)
WBC: 6.1 10*3/uL (ref 4.0–10.5)

## 2016-07-20 MED ORDER — FUROSEMIDE 80 MG PO TABS: 80.0000 mg | ORAL_TABLET | Freq: Every day | ORAL | Status: AC

## 2016-07-20 MED ORDER — GLYCOPYRROLATE 1 MG PO TABS: 1.0000 mg | ORAL_TABLET | Freq: Two times a day (BID) | ORAL | Status: AC

## 2016-07-20 MED ORDER — CIPROFLOXACIN HCL 500 MG PO TABS: 500.0000 mg | ORAL_TABLET | Freq: Two times a day (BID) | ORAL | 0 refills | Status: DC

## 2016-07-20 NOTE — Progress Notes (Signed)
Report called to Margarito Liner, RN at [facility]. Pt will be transport via PTA.

## 2016-07-20 NOTE — Progress Notes (Signed)
SATURATION QUALIFICATIONS: (This note is used to comply with regulatory documentation for home oxygen)  Patient Saturations on Room Air at Rest =80 %    Patient Saturations on Room Air while Ambulating = 75%  Patient Saturations on 3 Liters of oxygen while Ambulating = 93  Please briefly explain why patient needs home oxygen:    At rest, pt  oxygen saturation was 100% on 3L [place] and 80% on RA. While ambulating, pt oxygen saturation was 75% on RA, 85% on 2L via [place] and 93% on 3L via .

## 2016-07-20 NOTE — Clinical Social Work Placement (Signed)
   CLINICAL SOCIAL WORK PLACEMENT  NOTE  Date:  07/20/2016  Patient Details  Name: [name] MRN: [id number] Date of Birth: 23-Oct-1932  Clinical Social Work is seeking post-discharge placement for this patient at the [place] level of care (*CSW will initial, date and re-position this form in  chart as items are completed):  Yes   Patient/family provided with [place] Work Department's list of facilities offering this level of care within the geographic area requested by the patient (or if unable, by the patient's family).  Yes   Patient/family informed of their freedom to choose among providers that offer the needed level of care, that participate in Medicare, Medicaid or managed care program needed by the patient, have an available bed and are willing to accept the patient.  Yes   Patient/family informed of [place]'s ownership interest in [facility] and [facility], as well as of the fact that they are under no obligation to receive care at these facilities.  PASRR submitted to [employer] on 07/17/16     PASRR number received on 07/17/16     Existing PASRR number confirmed on       FL2 transmitted to all facilities in geographic area requested by pt/family on 07/17/16     FL2 transmitted to all facilities within larger geographic area on       Patient informed that his/her managed care company has contracts with or will negotiate with certain facilities, including the following:        Yes   Patient/family informed of bed offers received.  Patient chooses bed at [facility]     Physician recommends and patient chooses bed at      Patient to be transferred to [facility] on 07/20/16.  Patient to be transferred to facility by [facility]     Patient family notified on 07/20/16 of transfer.  Name of family member notified:  Jorge Mandril     PHYSICIAN Please sign DNR     Additional Comment: Pt / family are in agreement with d/c  to [facility] today. [facility] transport is required. Medical necessity form completed. D/C Summary sent to SNF for review. No scripts printed. # for report provided to nsg.   _______________________________________________ Luretha Rued, [place]  [phone] 07/20/2016, 3:16 PM

## 2016-07-20 NOTE — Discharge Summary (Addendum)
Physician Discharge Summary  [name] KVQ:[id number] DOB: 07/05/1932 DOA: 07/16/2016  PCP: Tivis Ringer, MD  Admit date: 07/16/2016 Discharge date: 07/21/2016  Admitted From: ALF Disposition: SNF   Recommendations for Outpatient Follow-up:  1. Follow up with PCP in 1-2 weeks 2. Please obtain BMP/CBC in one week.  3. Please monitor weights, intact/output, and respiratory status. Discharged on lasix 80mg  po daily (up from previous dose of 40mg  po daily) 4. Will benefit from ongoing speech therapy evaluation 5. Continue ciprofloxacin for treatment of CA-UTI with GNR's. Please follow up urine culture speciation and sensitivities pending at discharge.   ADDENDUM: Urine culture results finalized with citrobacter resistant to ciprofloxacin among others. I have left a message with the director of nursing at [facility] and spoken by phone with the patient's nurse, providing a verbal order as follows:  - DISCONTINUE CIPROFLOXACIN - START Bactrim DS 800-160mg  po q12h x 5 days.  - Will have CSW fax urine culture and sensitivity data to [facility] at [phone].  Vance Gather, MD 07/21/2016 2:16 PM    Home Health: N/A Equipment/Devices: 3L O2 by nasal cannula. Discharge Condition: STable CODE STATUS: DNR Diet recommendation: Heart healthy  Brief/Interim Summary: [name] an 83 y.o.malewith medical history significant of combined CHF (EF 15-20%), seizure disorder, and COPD on 3L home oxygen, recently discharged 3/16 after HCAP Tx, who presented from ALF with dyspnea and increased oxygen requirement, up to 6L by [place] on arrival. His POA reports he does not follow low salt diet and doubts he can return to ALF. PT evaluation recommended SNF.   CXR initially showed bilateral pulmonary edema. BNP was elevated to 1572. IV lasix was started with minimal diuresis, so this was increased with improvement in respiratory status. Repeat CXR shows interval improvement and he reports he is  back to his baseline. He will be discharged with oxygen on increased lasix dose. See below for further pertinent details of admission.  Discharge Diagnoses:  Active Problems:   Hypertension   Seizure disorder (HCC)   CAD (coronary artery disease)   Acute on chronic combined systolic and diastolic CHF (congestive heart failure) (HCC)   Dementia   Heart failure (HCC)   Acute on chronic respiratory failure with hypoxemia (HCC)  Acute on chronic hypoxic respiratory failure: Likely related to CHF exacerbation. Chest x ray with patchy bilateral airspace diseases, Pulmonary edema vs PNA which resolved on repeat 4/19. Elevated BNP at 1500. No effusions on repeat CXR. - Wean oxygen as able with diuresis. Will ambulate prior to DC to see oxygen requirement.  - SLP suspects possible chronic aspiration, though CXR without infiltrate, no fever or leukocytosis, negative sputum culture. Recommend ongoing SLP therapy and monitoring off abx.   Acute combined CHF exacerbation and ischemic cardiomyopathy/CAD s/p LAD stenting: EF 15-20% by ECHO in Feb 2018. 150lbs on admission, which was discharge weight at last hospitalization.EDW, discharge weight this admission is 143lbs after diuresis. Troponins 0.03 x3, no ACS. - Will discharge on lasix 80mg  po daily (increased from previous home dose of 40mg  daily). Will need to monitor BMP, weights, and I/O - Continue with ACE, spironolactone and coreg.   Urinary retention with chronic indwelling foley and CA-UTI: Pt noted to have green urine output 4/17. UA from new foley showed nitrite+ infection, culture growing GNRs.  - Monitor urine culture final results: growing GNR's at discharge. With frequent hospitalizations and green urine, will ensure coverage of Pseudomonas with cipro 500mg  po q12h (CrCl at discharge is 77ml/min) to  complete 7-days of therapy started 4/18 (first full day). Will need to follow culture speciation and sensitivities closely.  - Foley usually  changed out q3wk at [facility]. Changed 4/17.  Dysphagia: With chronic drooling per patient attributes to 9 teeth extractions. No other evidence of parkinsonian symptoms. CT head without acute CVA on background of CVD.  - MBSS as above shows moderate oral and mild pharyngeal dysphagia, suspect he could be chronically aspirating, though there is no current evidence of pneumonitis/pneumonia.  - Low dose robinul seems to be improving secretions.   Indeterminate lesion in left internal carotid artery seen on CT 4/17. Discussed with neurology, Dr. Shon Hale, who recommended CTA but has dye allergy. MRA showed no stenosis of carotid artery, so this was likely calcification. Results discussed with patient and HCPOA. - Continue ASA 81mg   Dementia: Chronic, stable. Continue Aricept. Discussed with step-daughter who is also HCPOA.   COPD: Typical CXR findings but no evidence of exacerbation. Continue nebulizer Tx.   Seizure disorder: Stable. Continue with dilantin.   Discharge Instructions  Allergies as of 07/20/2016      Reactions   Red Dye Other (See Comments)   Per Mar.   Iodinated Diagnostic Agents Rash   Pt given 50 mg Benadryl prior to Omnipaque injection, tolerated well.      Medication List    TAKE these medications   acetaminophen 500 MG tablet Commonly known as:  TYLENOL Take 1,000 mg by mouth every 8 (eight) hours as needed for moderate pain.   aspirin 81 MG chewable tablet Chew 81 mg by mouth daily.   atorvastatin 10 MG tablet Commonly known as:  LIPITOR Take 10 mg by mouth daily.   bismuth subsalicylate 161 WR/60AV suspension Commonly known as:  PEPTO BISMOL Take 30 mLs by mouth as needed for diarrhea or loose stools.   carvedilol 6.25 MG tablet Commonly known as:  COREG Take 6.25 mg by mouth 2 (two) times daily with a meal.   cholecalciferol 1000 units tablet Commonly known as:  VITAMIN D Take 1,000 Units by mouth daily.   ciprofloxacin 500 MG  tablet Commonly known as:  CIPRO Take 1 tablet (500 mg total) by mouth 2 (two) times daily.   DELSYM 30 MG/5ML liquid Generic drug:  dextromethorphan Take 10 mLs by mouth daily as needed for cough.   donepezil 10 MG tablet Commonly known as:  ARICEPT Take 10 mg by mouth at bedtime.   fluticasone furoate-vilanterol 100-25 MCG/INH Aepb Commonly known as:  BREO ELLIPTA Inhale 1 puff into the lungs daily with breakfast.   furosemide 80 MG tablet Commonly known as:  LASIX Take 1 tablet (80 mg total) by mouth daily. What changed:  medication strength  how much to take   glycopyrrolate 1 MG tablet Commonly known as:  ROBINUL Take 1 tablet (1 mg total) by mouth 2 (two) times daily.   guaiFENesin 600 MG 12 hr tablet Commonly known as:  MUCINEX Take 1 tablet (600 mg total) by mouth 2 (two) times daily.   loperamide 2 MG capsule Commonly known as:  IMODIUM Take 1 capsule (2 mg total) by mouth 4 (four) times daily as needed for diarrhea or loose stools.   losartan 25 MG tablet Commonly known as:  COZAAR Take 25 mg by mouth daily.   mirabegron ER 50 MG Tb24 tablet Commonly known as:  MYRBETRIQ Take 50 mg by mouth daily with breakfast.   nitroGLYCERIN 0.3 MG SL tablet Commonly known as:  NITROSTAT Place 0.3 mg  under the tongue every 5 (five) minutes as needed for chest pain.   ORAJEL MOUTH-AID 20 % Gel Generic drug:  benzocaine Use as directed 1 application in the mouth or throat 4 (four) times daily as needed (gum pain).   phenytoin 100 MG ER capsule Commonly known as:  DILANTIN Take 200 mg by mouth 2 (two) times daily.   PREPARATION H HYDROCORTISONE 1 % Generic drug:  hydrocortisone cream Apply 1 application topically 2 (two) times daily as needed. For hemmoroids.   spironolactone 25 MG tablet Commonly known as:  ALDACTONE Take 25 mg by mouth every morning.       Contact information for follow-up providers    Tivis Ringer, MD Follow up.   Specialty:   Internal Medicine Contact information: [place] [address] [phone]            Contact information for after-discharge care    Destination    [facility] Follow up.   Specialty:  [place] information: [address] [place] [phone]                 Allergies  Allergen Reactions  . Red Dye Other (See Comments)    Per Mar.  . Iodinated Diagnostic Agents Rash    Pt given 50 mg Benadryl prior to Omnipaque injection, tolerated well.    Consultations:  None  Procedures/Studies: Dg Chest 2 View  Result Date: 07/20/2016 CLINICAL DATA:  History of COPD, coronary artery disease, and pulmonary edema, now with increased shortness of breath, productive cough, and increased oxygen requirement. EXAM: CHEST  2 VIEW COMPARISON:  Chest x-ray of July 18, 2016 FINDINGS: The lungs remain hyperinflated. The pulmonary interstitial markings have improved. The pulmonary vascularity is less engorged. The heart is mildly enlarged but less conspicuous today. There is calcification in the wall of the thoracic aorta. The mediastinum is normal in width. There is no significant pleural effusion. IMPRESSION: COPD. Improved appearance of the pulmonary interstitium bilaterally consistent with decreasing interstitial edema. Persistent mild cardiomegaly with mild central pulmonary vascular congestion. No pneumonia. Thoracic aortic atherosclerosis. Electronically Signed   By: David  Martinique M.D.   On: 07/20/2016 10:59   Dg Chest 2 View  Result Date: 07/18/2016 CLINICAL DATA:  Acute respiratory failure, hypoxia EXAM: CHEST  2 VIEW COMPARISON:  07/16/2016 FINDINGS: Cardiomediastinal silhouette is stable. Small bilateral pleural effusion pre There is mild right perihilar and bilateral basilar streaky interstitial prominence and patchy airspace opacification. Findings suspicious for mild asymmetric pulmonary edema or  less likely asymmetric pneumonia. Clinical correlation is necessary IMPRESSION: Small bilateral pleural effusion pre There is mild right perihilar and bilateral basilar streaky interstitial prominence and patchy airspace opacification. Findings suspicious for mild asymmetric pulmonary edema or less likely asymmetric pneumonia. Clinical correlation is necessary Electronically Signed   By: Lahoma Crocker M.D.   On: 07/18/2016 08:57   Dg Chest 2 View  Result Date: 07/16/2016 CLINICAL DATA:  Acute onset of shortness of breath. Initial encounter. EXAM: CHEST  2 VIEW COMPARISON:  Chest radiograph performed 06/14/2016 FINDINGS: The lungs are well-aerated. Small bilateral pleural effusions noted. Patchy bibasilar airspace opacities may reflect pulmonary edema or pneumonia. Underlying vascular congestion is seen. No pneumothorax is identified. The heart is enlarged.  No acute osseous abnormalities are seen. IMPRESSION: Small bilateral pleural effusions. Underlying vascular congestion and cardiomegaly. Patchy bibasilar airspace opacities may reflect pulmonary edema or pneumonia. Electronically Signed   By: Francoise Schaumann.D.  On: 07/16/2016 06:02   Ct Head Wo Contrast  Result Date: 07/18/2016 CLINICAL DATA:  83 year old male with right facial droop. Initial encounter. EXAM: CT HEAD WITHOUT CONTRAST TECHNIQUE: Contiguous axial images were obtained from the base of the skull through the vertex without intravenous contrast. COMPARISON:  04/10/2011 brain MR.  04/09/2011 head CT. FINDINGS: Brain: No intracranial hemorrhage. Hyperdense ectatic left internal carotid artery supraclinoid segment/carotid terminus. The hyperdensity may be related to calcifications although different than on prior CT and thrombus therefore not excluded. No other findings of large acute infarct. Prominent chronic microvascular changes. Remote right corona radiata infarct. Moderate global atrophy without hydrocephalus. No intracranial mass lesion  noted on this unenhanced exam. Vascular: Vascular calcifications with ectatic left internal carotid artery supraclinoid segment Skull: No acute abnormality. Sinuses/Orbits: Post lens replacement without acute orbital abnormality. Opacification right frontal sinus and mid to anterior right ethmoid sinus air cells. Other: Mastoid air cells and middle ear cavities are clear. IMPRESSION: No intracranial hemorrhage. Hyperdense left internal carotid artery supraclinoid segment. Cannot exclude thrombus. Prominent chronic microvascular changes. Remote right corona radiata infarct. Moderate global atrophy without hydrocephalus. Opacification right frontal sinus and mid to anterior right ethmoid sinus air cells. These results will be called to the ordering clinician or representative by the Radiologist Assistant, and communication documented in the PACS or zVision Dashboard. Electronically Signed   By: Genia Del M.D.   On: 07/18/2016 15:45   Mr Jodene Nam Head Wo Contrast  Result Date: 07/18/2016 CLINICAL DATA:  Left internal carotid artery stenosis. EXAM: MRA HEAD WITHOUT CONTRAST TECHNIQUE: Angiographic images of the Circle of Willis were obtained using MRA technique without intravenous contrast. COMPARISON:  CT head 07/18/2016 FINDINGS: Image quality degraded by mild motion. Both vertebral arteries contribute to the basilar and are patent without stenosis. Basilar is tortuous but widely patent. Posterior cerebral arteries are patent bilaterally with a moderate stenosis proximal left posterior cerebral artery. Internal carotid artery widely patent bilaterally with tortuosity but no significant stenosis. Anterior cerebral artery patent bilaterally. Mild stenosis right M1 segment. Left M1 segment patent without significant stenosis. Note is made of calcification in the left M1 segment on CT. Middle cerebral artery branches not well visualized due to motion and small-vessel size. IMPRESSION: Moderate stenosis proximal left  posterior cerebral artery. Mild stenosis right M1 segment.  Left M1 segment widely patent. Electronically Signed   By: Franchot Gallo M.D.   On: 07/18/2016 19:09   Dg Swallowing Func-speech Pathology  Result Date: 07/19/2016 Objective Swallowing Evaluation: Type of Study: MBS-Modified Barium Swallow Study Patient Details Name: [name] MRN: [id number] Date of Birth: Sep 28, 1932 Today's Date: 07/19/2016 Time: SLP Start Time (ACUTE ONLY): 0809-SLP Stop Time (ACUTE ONLY): 0838 SLP Time Calculation (min) (ACUTE ONLY): 29 min Past Medical History: Past Medical History: Diagnosis Date . Acute MI [facility]) Dec 2010  cardiac catheriziation and stenting of the LAD (bare metal) 03/14/09             . Anemia  . Angina  . Blood transfusion  . CAD (coronary artery disease)  . COPD (chronic obstructive pulmonary disease) ([place])  . DEMENTIA  . High cholesterol  . Hypertension  . LV dysfunction   EF 25 to 30% per echo May 2012 . NSTEMI (non-ST elevated myocardial infarction) [facility]) Nov 2011  with LV dysfunction . Prostatic hypertrophy  . Seizures ([facility]) ~ 2006  "when blood pressure went up too high"; on Dilantin . Urethral stricture   with subsequent trauma and has an indwelling  Foley catheter in place during his first MI                                                . Urinary catheter in place   chronic . Vision impairment   "legally blind" Past Surgical History: Past Surgical History: Procedure Laterality Date . CARDIAC CATHETERIZATION  02/2010 . CATARACT EXTRACTION    "both eyes; when I was small; I was born w/cataracrs" . CHOLECYSTECTOMY  05/30/11 . CHOLECYSTECTOMY  05/30/2011  Procedure: LAPAROSCOPIC CHOLECYSTECTOMY;  Surgeon: Judieth Keens, DO;  Location: [place];  Service: General;  Laterality: N/A; . CORONARY ANGIOPLASTY WITH STENT PLACEMENT  03/2009  "1"; stent to LAD . TONSILLECTOMY    "when I was small" HPI: 83 y.o. male with medical history significant of Combine  Heart failure Ef 15 to 20 %, Ischemic cardiomyopathy,  seizure disorder, COPD, on 3 L oxygen, recently discharge from hospital 3-16 treated at that time for HCAP, and acute hypoxic respiratory failure. He presents today with increase SOB, productive cough and increase oxygen requirement. He denies chest pain.  Pt underwent MRI showing neurological involvement.   Subjective: pt awake in chair Assessment / Plan / Recommendation CHL IP CLINICAL IMPRESSIONS 07/19/2016 Clinical Impression Moderate oral and mild pharyngeal dysphagia characterized by decreased oral coordination, lingual pumping and premature spillage of boluses into pharynx with mild oral residuals. Secretions retained in oral cavity mix with barium and spill into pharynx without pt awareness to clear.  Cued swallows helpful to clear portion of oral secretions.  Laryngeal penetration of thin to cords noted due to decreased timing of laryngeal closure.  Subtle throat clear removed barium from cords but suspect chronic low grade aspiration of thin and secretions.  Of not, pt also cleared his throat without laryngeal penetration of barium observed - presume secretion aspiration/penetration as source.  Use of chin tuck posture prevented penetration.  Recommend continue dys3/thin with precautions including chin tuck with liquids and dry swallows.  Using live video, educated pt to findings/recommendations and provided in writing.  Pt reports his "reading is not too good".  Will follow up for dysphagia management.  SLP Visit Diagnosis Dysphagia, oropharyngeal phase (R13.12) Attention and concentration deficit following -- Frontal lobe and executive function deficit following -- Impact on safety and function Moderate aspiration risk   CHL IP TREATMENT RECOMMENDATION 07/19/2016 Treatment Recommendations Therapy as outlined in treatment plan below   Prognosis 07/19/2016 Prognosis for Safe Diet Advancement Good Barriers to Reach Goals -- Barriers/Prognosis Comment -- CHL IP DIET RECOMMENDATION 07/19/2016 SLP Diet  Recommendations Dysphagia 3 (Mech soft) solids;Thin liquid Liquid Administration via Straw Medication Administration Whole meds with puree Compensations Slow rate;Small sips/bites;Use straw to facilitate chin tuck;Chin tuck;Other (Comment) Postural Changes Seated upright at 90 degrees;Remain semi-upright after after feeds/meals (Comment)   CHL IP OTHER RECOMMENDATIONS 07/19/2016 Recommended Consults -- Oral Care Recommendations Oral care QID Other Recommendations --   CHL IP FOLLOW UP RECOMMENDATIONS 07/19/2016 Follow up Recommendations Skilled Nursing facility   [facility] IP FREQUENCY AND DURATION 07/19/2016 Speech Therapy Frequency (ACUTE ONLY) min 2x/week Treatment Duration 2 weeks      CHL IP ORAL PHASE 07/19/2016 Oral Phase -- Oral - Pudding Teaspoon -- Oral - Pudding Cup -- Oral - Honey Teaspoon -- Oral - Honey Cup -- Oral - Nectar Teaspoon -- Oral - Nectar Cup Weak lingual manipulation;Decreased bolus  cohesion;Premature spillage;Lingual pumping Oral - Nectar Straw -- Oral - Thin Teaspoon -- Oral - Thin Cup Weak lingual manipulation;Premature spillage;Decreased bolus cohesion;Lingual pumping Oral - Thin Straw Weak lingual manipulation;Lingual pumping;Piecemeal swallowing;Premature spillage;Decreased bolus cohesion Oral - Puree Weak lingual manipulation;Delayed oral transit;Lingual pumping;Premature spillage Oral - Mech Soft Weak lingual manipulation;Impaired mastication;Delayed oral transit;Lingual pumping;Decreased bolus cohesion;Premature spillage Oral - Regular -- Oral - Multi-Consistency -- Oral - Pill Weak lingual manipulation;Lingual pumping;Premature spillage;Decreased bolus cohesion Oral Phase - Comment --  CHL IP PHARYNGEAL PHASE 07/19/2016 Pharyngeal Phase Impaired Pharyngeal- Pudding Teaspoon -- Pharyngeal -- Pharyngeal- Pudding Cup -- Pharyngeal -- Pharyngeal- Honey Teaspoon -- Pharyngeal -- Pharyngeal- Honey Cup -- Pharyngeal -- Pharyngeal- Nectar Teaspoon -- Pharyngeal -- Pharyngeal- Nectar Cup Pharyngeal  residue - pyriform Pharyngeal -- Pharyngeal- Nectar Straw -- Pharyngeal -- Pharyngeal- Thin Teaspoon -- Pharyngeal -- Pharyngeal- Thin Cup Pharyngeal residue - pyriform;Penetration/Aspiration during swallow Pharyngeal Material enters airway, CONTACTS cords and then ejected out Pharyngeal- Thin Straw Pharyngeal residue - pyriform Pharyngeal -- Pharyngeal- Puree Pharyngeal residue - pyriform Pharyngeal -- Pharyngeal- Mechanical Soft Pharyngeal residue - pyriform Pharyngeal -- Pharyngeal- Regular -- Pharyngeal -- Pharyngeal- Multi-consistency -- Pharyngeal -- Pharyngeal- Pill Pharyngeal residue - pyriform Pharyngeal -- Pharyngeal Comment --  CHL IP CERVICAL ESOPHAGEAL PHASE 07/19/2016 Cervical Esophageal Phase WFL Pudding Teaspoon -- Pudding Cup -- Honey Teaspoon -- Honey Cup -- Nectar Teaspoon -- Nectar Cup -- Nectar Straw -- Thin Teaspoon -- Thin Cup -- Thin Straw -- Puree -- Mechanical Soft -- Regular -- Multi-consistency -- Pill -- Cervical Esophageal Comment -- CHL IP GO 07/16/2016 Functional Assessment Tool Used (No Data) Functional Limitations Swallowing Swallow Current Status (D6644) CI Swallow Goal Status (I3474) CI Swallow Discharge Status (Q5956) (None) Motor Speech Current Status (L8756) (None) Motor Speech Goal Status (E3329) (None) Motor Speech Goal Status (J1884) (None) Spoken Language Comprehension Current Status (Z6606) (None) Spoken Language Comprehension Goal Status (T0160) (None) Spoken Language Comprehension Discharge Status (F0932) (None) Spoken Language Expression Current Status (T5573) (None) Spoken Language Expression Goal Status (U2025) (None) Spoken Language Expression Discharge Status (K2706) (None) Attention Current Status (C3762) (None) Attention Goal Status (G3151) (None) Attention Discharge Status (V6160) (None) Memory Current Status (V3710) (None) Memory Goal Status (G2694) (None) Memory Discharge Status (W5462) (None) Voice Current Status (V0350) (None) Voice Goal Status (K9381) (None)  Voice Discharge Status (W2993) (None) Other Speech-Language Pathology Functional Limitation Current Status (Z1696) (None) Other Speech-Language Pathology Functional Limitation Goal Status (V8938) (None) Other Speech-Language Pathology Functional Limitation Discharge Status [phone]) (None) Macario Golds 07/19/2016, 8:49 AM              Subjective: Patient has no complaints this morning. No more sputum production, no cough. Dyspnea with exertion back to baseline (winded after walking to nurse station and back). No chest pain.   Discharge Exam: BP 110/68 (BP Location: Left Arm)   Pulse 67   Temp 97.5 F (36.4 C) (Oral)   Resp 18   Ht 5\' 8"  (1.727 m)   Wt 65.2 kg (143 lb 11.8 oz)   SpO2 100%   BMI 21.86 kg/m   General: Pt is alert, awake, not in acute distress Cardiovascular: RRR, S1/S2 +, no rubs, no gallops. No JVD, no edema. Respiratory: Nonlabored on 3.5L by nasal cannula.  Abdominal: Soft, NT, ND, bowel sounds + GU: Foley in place without irritation. Neuro: Alert and conversant. Drooling with mild right lip droop but CN's intact grossly when prompted. No focal weakness or sensory impairment. No tremor or rigidity.  Labs: BNP (  last 3 results)  Recent Labs  05/17/16 1916 06/13/16 0538 07/16/16 0525  BNP 1,422.4* 1,014.6* 5,465.6*   Basic Metabolic Panel:  Recent Labs Lab 07/16/16 0525 07/17/16 0353 07/18/16 0609 07/19/16 0629 07/20/16 0548  NA 141 140 139 139 137  K 3.6 4.0 4.0 4.0 4.2  CL 111 109 109 108 103  CO2 23 25 22 25 27   GLUCOSE 95 85 86 85 85  BUN 15 15 20 17 16   CREATININE 0.99 1.18 1.14 1.12 1.18  CALCIUM 8.8* 8.5* 8.6* 8.7* 9.0   Liver Function Tests:  Recent Labs Lab 07/16/16 0525  AST 20  ALT 18  ALKPHOS 66  BILITOT 0.6  PROT 6.5  ALBUMIN 3.8   CBC:  Recent Labs Lab 07/16/16 0525 07/17/16 0353 07/18/16 0609 07/19/16 0629 07/20/16 0548  WBC 6.6 7.4 6.6 5.9 6.1  NEUTROABS 4.6  --   --   --   --   HGB 11.8* 12.3* 11.9* 11.8*  12.4*  HCT 35.6* 37.3* 35.8* 36.4* 37.6*  MCV 91.0 90.5 91.6 90.3 92.2  PLT 198 193 196 190 195   Cardiac Enzymes:  Recent Labs Lab 07/16/16 0525 07/16/16 1239 07/16/16 1843 07/17/16 0353  TROPONINI 0.03* 0.03* 0.03* 0.03*   Urinalysis    Component Value Date/Time   COLORURINE YELLOW 07/18/2016 1859   APPEARANCEUR CLOUDY (A) 07/18/2016 1859   LABSPEC 1.025 07/18/2016 1859   PHURINE 5.0 07/18/2016 1859   GLUCOSEU NEGATIVE 07/18/2016 1859   HGBUR MODERATE (A) 07/18/2016 1859   BILIRUBINUR SMALL (A) 07/18/2016 1859   KETONESUR NEGATIVE 07/18/2016 1859   PROTEINUR 30 (A) 07/18/2016 1859   UROBILINOGEN 0.2 08/08/2014 1109   NITRITE POSITIVE (A) 07/18/2016 1859   LEUKOCYTESUR LARGE (A) 07/18/2016 1859    Microbiology Recent Results (from the past 240 hour(s))  MRSA PCR Screening     Status: None   Collection Time: 07/16/16  3:55 PM  Result Value Ref Range Status   MRSA by PCR NEGATIVE NEGATIVE Final    Comment:        The GeneXpert MRSA Assay (FDA approved for NASAL specimens only), is one component of a comprehensive MRSA colonization surveillance program. It is not intended to diagnose MRSA infection nor to guide or monitor treatment for MRSA infections.   Culture, expectorated sputum-assessment     Status: None   Collection Time: 07/16/16  6:06 PM  Result Value Ref Range Status   Specimen Description SPUTUM  Final   Special Requests NONE  Final   Sputum evaluation THIS SPECIMEN IS ACCEPTABLE FOR SPUTUM CULTURE  Final   Report Status 07/16/2016 FINAL  Final  Culture, respiratory (NON-Expectorated)     Status: None   Collection Time: 07/16/16  6:06 PM  Result Value Ref Range Status   Specimen Description SPUTUM  Final   Special Requests NONE Reflexed from [id number]  Final   Gram Stain   Final    FEW WBC PRESENT, PREDOMINANTLY PMN FEW GRAM POSITIVE COCCI IN PAIRS RARE GRAM POSITIVE COCCI IN CHAINS RARE GRAM NEGATIVE RODS    Culture   Final    Consistent with  normal respiratory flora. Performed at [facility], [place] [address] [place], [place] [address]    Report Status 07/19/2016 FINAL  Final  Culture, Urine     Status: Abnormal (Preliminary result)   Collection Time: 07/18/16  6:59 PM  Result Value Ref Range Status   Specimen Description URINE, CATHETERIZED  Final   Special Requests NONE  Final  Culture >=100,000 COLONIES/mL CITROBACTER SPECIES (A)  Final   Report Status PENDING  Incomplete   Organism ID, Bacteria CITROBACTER SPECIES (A)  Final      Susceptibility   Citrobacter species - MIC*    CEFAZOLIN >=64 RESISTANT Resistant     CEFTRIAXONE >=64 RESISTANT Resistant     CIPROFLOXACIN >=4 RESISTANT Resistant     GENTAMICIN <=1 SENSITIVE Sensitive     IMIPENEM 1 SENSITIVE Sensitive     NITROFURANTOIN <=16 SENSITIVE Sensitive     TRIMETH/SULFA <=20 SENSITIVE Sensitive     PIP/TAZO >=128 RESISTANT Resistant     * >=100,000 COLONIES/mL CITROBACTER SPECIES    Time coordinating discharge: Approximately 40 minutes  Vance Gather, MD  [name] 07/21/2016, 2:15 PM Pager [phone]

## 2016-07-20 NOTE — Progress Notes (Signed)
  Speech Language Pathology Treatment:    Patient Details Name: [name] MRN: [id number] DOB: 12/20/1932 Today's Date: 07/20/2016 Time: 1205-1224 SLP Time Calculation (min) (ACUTE ONLY): 19 min  Assessment / Plan / Recommendation Clinical Impression  Pt seen to assess po tolerance and use of compensation strategies.  SLP observed pt consuming lunch = wet voice intermittently noted prior to and during meal. Pt benefiting from from moderate cues to tuck chin and clear/hock if voice wet.  He articulated all precautions to this SLP.  Toward end of session, pt's requirement for cues faded from moderate to mod I.  Continue to recommend follow up SLP for dysphagia management/treatment.  Educated pt who is agreeable to plan.   Note CXR results.      HPI HPI: 83 y.o. 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 He presents today with increase SOB, productive cough and increase oxygen requirement. He denies chest pain.      SLP Plan  Continue with current plan of care       Recommendations  Diet recommendations: Dysphagia 3 (mechanical soft);Thin liquid Medication Administration: Whole meds with puree Supervision: Patient able to self feed Compensations: Slow rate;Small sips/bites;Multiple dry swallows after each bite/sip;Chin tuck;Use straw to facilitate chin tuck Postural Changes and/or Swallow Maneuvers: Seated upright 90 degrees;Upright 30-60 min after meal      Patient may use Passy-Muir Speech Valve: Intermittently with supervision         Follow up Recommendations: Skilled Nursing facility SLP Visit Diagnosis: Dysphagia, oropharyngeal phase (R13.12) Plan: Continue with current plan of care       [place], [facility] SLP [phone]

## 2016-07-20 NOTE — Care Management Important Message (Signed)
Important Message  Patient Details  Name: [name] MRN: [id number] Date of Birth: 01/09/1933   Medicare Important Message Given:  Yes    [name] 07/20/2016, 10:25 [place] Message  Patient Details  Name: [name] MRN: [id number] Date of Birth: February 21, 1933   Medicare Important Message Given:  Yes    [name] 07/20/2016, 10:25 AM

## 2016-07-20 NOTE — Care Management Note (Signed)
Case Management Note  Patient Details  Name: [name] MRN: [id number] Date of Birth: [birth year]  Subjective/Objective:                    Action/Plan:d/c SNF.   Expected Discharge Date:  07/20/16               Expected Discharge Plan:  Skilled Nursing Facility  In-House Referral:  Clinical Social Work  Discharge planning Services  CM Consult  Post Acute Care Choice:    Choice offered to:     DME Arranged:    DME Agency:     HH Arranged:    [place] Agency:     Status of Service:  Completed, signed off  If discussed at [employer], dates discussed:    Additional Comments:  Dessa Phi, RN 07/20/2016, 2:21 PM

## 2016-07-23 LAB — URINE CULTURE: Culture: >=100000 — AB

## 2016-07-24 ENCOUNTER — Encounter: Payer: Self-pay | Admitting: Internal Medicine

## 2016-07-24 ENCOUNTER — Non-Acute Institutional Stay (SKILLED_NURSING_FACILITY): Payer: Medicare Other | Admitting: Internal Medicine

## 2016-07-24 DIAGNOSIS — R1312 Dysphagia, oropharyngeal phase: Secondary | ICD-10-CM | POA: Insufficient documentation

## 2016-07-24 DIAGNOSIS — I25119 Atherosclerotic heart disease of native coronary artery with unspecified angina pectoris: Secondary | ICD-10-CM

## 2016-07-24 DIAGNOSIS — N3281 Overactive bladder: Secondary | ICD-10-CM

## 2016-07-24 DIAGNOSIS — R531 Weakness: Secondary | ICD-10-CM | POA: Diagnosis not present

## 2016-07-24 DIAGNOSIS — J9612 Chronic respiratory failure with hypercapnia: Secondary | ICD-10-CM

## 2016-07-24 DIAGNOSIS — J9611 Chronic respiratory failure with hypoxia: Secondary | ICD-10-CM | POA: Diagnosis not present

## 2016-07-24 DIAGNOSIS — J449 Chronic obstructive pulmonary disease, unspecified: Secondary | ICD-10-CM | POA: Diagnosis not present

## 2016-07-24 DIAGNOSIS — F0390 Unspecified dementia without behavioral disturbance: Secondary | ICD-10-CM

## 2016-07-24 DIAGNOSIS — A498 Other bacterial infections of unspecified site: Secondary | ICD-10-CM | POA: Diagnosis not present

## 2016-07-24 DIAGNOSIS — I5042 Chronic combined systolic (congestive) and diastolic (congestive) heart failure: Secondary | ICD-10-CM | POA: Diagnosis not present

## 2016-07-24 DIAGNOSIS — G40909 Epilepsy, unspecified, not intractable, without status epilepticus: Secondary | ICD-10-CM | POA: Diagnosis not present

## 2016-07-24 DIAGNOSIS — D638 Anemia in other chronic diseases classified elsewhere: Secondary | ICD-10-CM

## 2016-07-24 NOTE — Progress Notes (Signed)
LOCATION: [name]  PCP: Tivis Ringer, MD   Code Status: Full Code  Goals of care: Advanced Directive information Advanced Directives 07/16/2016  Does Patient Have a Medical Advance Directive? Yes  Type of Paramedic of [place];Living will;Out of facility DNR (pink MOST or yellow form)  Does patient want to make changes to medical advance directive? No - Patient declined  Copy of [place] in Chart? No - copy requested  Pre-existing out of facility DNR order (yellow form or pink MOST form) Yellow form placed in chart (order not valid for inpatient use)       Extended Emergency Contact Information Primary Emergency Contact: Burnett,[name] Address: [place], [place] [address] [place] [employer] [place] Phone: [phone] Relation: Relative Secondary Emergency Contact: Herring,Gaynell  [place] [employer] Phone: [phone] Relation: Other   Allergies  Allergen Reactions  . Red Dye Other (See Comments)    Per Mar.  . Iodinated Diagnostic Agents Rash    Pt given 50 mg Benadryl prior to Omnipaque injection, tolerated well.    Chief Complaint  Patient presents with  . New Admit To SNF    New Admission Visit      HPI:  Patient is a 83 y.o. male seen today for short term rehabilitation post hospital admission from 07/16/2016-07/20/2016 with worsening dyspnea. He was diagnosed with acute on chronic respiratory failure from acute CHF exacerbation. He required IV diuresis and was later switched to oral diuretic. He was also diagnosed with urinary tract infection and started on antibiotic. He has chronic Foley catheter for urinary retention. He has medical history of CHF, seizure, COPD on chronic oxygen, dementia among others. He is seen in his room today. Of note his urine culture result has resulted with significant growth of Citrobacter. He was initially placed on ciprofloxacin and the  culture result showed resistance to this antibiotic. He is now on Bactrim.  Review of Systems:  Constitutional: Negative for fever, chills, diaphoresis. Energy level is fair. HENT: Negative for headache, congestion, nasal discharge, sore throat, difficulty swallowing.   Eyes: Negative for eye pain, blurred vision, double vision and discharge. Wears glasses. Respiratory: Negative for shortness of breath and wheezing. Positive for cough with phlegm that has been clearing out per patient. Denies any hemoptysis  Cardiovascular: Negative for chest pain, palpitations, leg swelling.  Gastrointestinal: Negative for heartburn, nausea, vomiting, abdominal pain, loss of appetite, melena, diarrhea and constipation. Last bowel movement was yesterday. Genitourinary: Negative for flank pain. Positive for dysuria and urgency.  Musculoskeletal: Negative for back pain, fall in the facility.  Skin: Negative for itching, rash.  Neurological: Negative for dizziness. Psychiatric/Behavioral: Negative for depression   Past Medical History:  Diagnosis Date  . Acute MI [facility]) Dec 2010   cardiac catheriziation and stenting of the LAD (bare metal) 03/14/09              . Anemia   . Angina   . Blood transfusion   . CAD (coronary artery disease)   . COPD (chronic obstructive pulmonary disease) ([place])   . DEMENTIA   . High cholesterol   . Hypertension   . LV dysfunction    EF 25 to 30% per echo May 2012  . NSTEMI (non-ST elevated myocardial infarction) [facility]) Nov 2011   with LV dysfunction  . Prostatic hypertrophy   . Seizures ([place]) ~ 2006   "when blood pressure went  up too high"; on Dilantin  . Urethral stricture    with subsequent trauma and has an indwelling Foley catheter in place during his first MI                                                 . Urinary catheter in place    chronic  . Vision impairment    "legally blind"   Past Surgical History:  Procedure Laterality Date  . CARDIAC CATHETERIZATION   02/2010  . 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 CHOLECYSTECTOMY  05/30/11  . CHOLECYSTECTOMY  05/30/2011   Procedure: LAPAROSCOPIC CHOLECYSTECTOMY;  Surgeon: Judieth Keens, DO;  Location: [place];  Service: General;  Laterality: N/A;  . CORONARY ANGIOPLASTY WITH STENT PLACEMENT  03/2009   "1"; stent to LAD  . TONSILLECTOMY     "when I was small"   Social History:   reports that he quit smoking about 11 years ago. His smoking use included Cigarettes. He smoked 0.00 packs per day for 50.00 years. He has never used smokeless tobacco. He reports that he does not drink alcohol or use drugs.  Family History  Problem Relation Age of Onset  . Colon cancer Neg Hx     Medications: Allergies as of 07/24/2016      Reactions   Red Dye Other (See Comments)   Per Mar.   Iodinated Diagnostic Agents Rash   Pt given 50 mg Benadryl prior to Omnipaque injection, tolerated well.      Medication List       Accurate as of 07/24/16 12:12 PM. Always use your most recent med list.          acetaminophen 500 MG tablet Commonly known as:  TYLENOL Take 1,000 mg by mouth every 8 (eight) hours as needed for moderate pain.   aspirin 81 MG chewable tablet Chew 81 mg by mouth daily.   atorvastatin 10 MG tablet Commonly known as:  LIPITOR Take 10 mg by mouth daily.   BACTRIM DS PO Take 1 tablet by mouth 2 (two) times daily.   bismuth subsalicylate 998 PJ/82NK suspension Commonly known as:  PEPTO BISMOL Take 30 mLs by mouth as needed for diarrhea or loose stools.   carvedilol 6.25 MG tablet Commonly known as:  COREG Take 6.25 mg by mouth 2 (two) times daily with a meal.   cholecalciferol 1000 units tablet Commonly known as:  VITAMIN D Take 1,000 Units by mouth daily.   DELSYM 30 MG/5ML liquid Generic drug:  dextromethorphan Take 10 mLs by mouth daily as needed for cough.   donepezil 10 MG tablet Commonly known as:  ARICEPT Take 10 mg by  mouth at bedtime.   fluticasone furoate-vilanterol 100-25 MCG/INH Aepb Commonly known as:  BREO ELLIPTA Inhale 1 puff into the lungs daily with breakfast.   furosemide 80 MG tablet Commonly known as:  LASIX Take 1 tablet (80 mg total) by mouth daily.   glycopyrrolate 1 MG tablet Commonly known as:  ROBINUL Take 1 tablet (1 mg total) by mouth 2 (two) times daily.   guaiFENesin 600 MG 12 hr tablet Commonly known as:  MUCINEX Take 1 tablet (600 mg total) by mouth 2 (two) times daily.   loperamide 2 MG capsule Commonly known as:  IMODIUM Take 1  capsule (2 mg total) by mouth 4 (four) times daily as needed for diarrhea or loose stools.   losartan 25 MG tablet Commonly known as:  COZAAR Take 25 mg by mouth daily.   mirabegron ER 50 MG Tb24 tablet Commonly known as:  MYRBETRIQ Take 50 mg by mouth daily with breakfast.   nitroGLYCERIN 0.3 MG SL tablet Commonly known as:  NITROSTAT Place 0.3 mg under the tongue every 5 (five) minutes as needed for chest pain.   ORAJEL MOUTH-AID 20 % Gel Generic drug:  benzocaine Use as directed 1 application in the mouth or throat 4 (four) times daily as needed (gum pain).   phenytoin 100 MG ER capsule Commonly known as:  DILANTIN Take 200 mg by mouth 2 (two) times daily.   PREPARATION H HYDROCORTISONE 1 % Generic drug:  hydrocortisone cream Apply 1 application topically 2 (two) times daily as needed. For hemmoroids.   spironolactone 25 MG tablet Commonly known as:  ALDACTONE Take 25 mg by mouth every morning.       Immunizations: Immunization History  Administered Date(s) Administered  . PPD Test 05/30/2011, 07/20/2016     Physical Exam: Vitals:   07/24/16 1205  BP: 138/88  Pulse: 76  Resp: 20  Temp: 98.3 F (36.8 C)  TempSrc: Oral  SpO2: 96%  Weight: 142 lb 9.6 oz (64.7 kg)  Height: 5\' 8"  (1.727 m)   Body mass index is 21.68 kg/m.  General- elderly male, frail, thin built, in no acute distress Head- normocephalic,  atraumatic, hard of hearing Nose- no maxillary or frontal sinus tenderness, no nasal discharge Throat- moist mucus membrane, normal oropharynx, poor dentition Eyes- PERRLA, EOMI, no pallor, no icterus, no discharge, normal conjunctiva, normal sclera Neck- no cervical lymphadenopathy Cardiovascular- normal s1,s2, no murmur Respiratory- decreased air entry to lung bases, no wheeze, no rhonchi, no crackles, no use of accessory muscles, on 3 L oxygen by nasal cannula Abdomen- bowel sounds present, soft, non tender, no guarding or rigidity, indwelling Foley catheter in place and draining good  Musculoskeletal- able to move all 4 extremities, generalized weakness, no leg edema, arthritis changes to his fingers Neurological- alert and oriented to person, place and time Skin- warm and dry Psychiatry- normal mood and affect    Labs reviewed: Basic Metabolic Panel:  Recent Labs  07/18/16 0609 07/19/16 0629 07/20/16 0548  NA 139 139 137  K 4.0 4.0 4.2  CL 109 108 103  CO2 22 25 27   GLUCOSE 86 85 85  BUN 20 17 16   CREATININE 1.14 1.12 1.18  CALCIUM 8.6* 8.7* 9.0   Liver Function Tests:  Recent Labs  02/27/16 2345 06/13/16 0519 07/16/16 0525  AST 22 29 20   ALT 16* 20 18  ALKPHOS 73 85 66  BILITOT 0.6 0.7 0.6  PROT 6.6 7.4 6.5  ALBUMIN 3.7 4.0 3.8    Recent Labs  02/27/16 2345  LIPASE 19   No results for input(s): AMMONIA in the last 8760 hours. CBC:  Recent Labs  05/17/16 1916 06/13/16 0538  07/16/16 0525  07/18/16 0609 07/19/16 0629 07/20/16 0548  WBC 7.6 12.0*  < > 6.6  < > 6.6 5.9 6.1  NEUTROABS 4.9 9.5*  --  4.6  --   --   --   --   HGB 13.2 12.9*  < > 11.8*  < > 11.9* 11.8* 12.4*  HCT 38.9* 39.2  < > 35.6*  < > 35.8* 36.4* 37.6*  MCV 89.6 90.1  < > 91.0  < >  91.6 90.3 92.2  PLT 235 248  < > 198  < > 196 190 195  < > = values in this interval not displayed. Cardiac Enzymes:  Recent Labs  12/03/15 1421 12/03/15 1749 12/03/15 2026  07/16/16 1239  07/16/16 1843 07/17/16 0353  CKTOTAL 45* 94 43*  --   --   --   --   CKMB 2.0 3.2 1.3  --   --   --   --   TROPONINI 0.04*  --  0.04*  < > 0.03* 0.03* 0.03*  < > = values in this interval not displayed. BNP: Invalid input(s): POCBNP CBG:  Recent Labs  06/13/16 2026  GLUCAP 100*    Radiological Exams: Dg Chest 2 View  Result Date: 07/20/2016 CLINICAL DATA:  History of COPD, coronary artery disease, and pulmonary edema, now with increased shortness of breath, productive cough, and increased oxygen requirement. EXAM: CHEST  2 VIEW COMPARISON:  Chest x-ray of July 18, 2016 FINDINGS: The lungs remain hyperinflated. The pulmonary interstitial markings have improved. The pulmonary vascularity is less engorged. The heart is mildly enlarged but less conspicuous today. There is calcification in the wall of the thoracic aorta. The mediastinum is normal in width. There is no significant pleural effusion. IMPRESSION: COPD. Improved appearance of the pulmonary interstitium bilaterally consistent with decreasing interstitial edema. Persistent mild cardiomegaly with mild central pulmonary vascular congestion. No pneumonia. Thoracic aortic atherosclerosis. Electronically Signed   By: David  Martinique M.D.   On: 07/20/2016 10:59   Dg Chest 2 View  Result Date: 07/18/2016 CLINICAL DATA:  Acute respiratory failure, hypoxia EXAM: CHEST  2 VIEW COMPARISON:  07/16/2016 FINDINGS: Cardiomediastinal silhouette is stable. Small bilateral pleural effusion pre There is mild right perihilar and bilateral basilar streaky interstitial prominence and patchy airspace opacification. Findings suspicious for mild asymmetric pulmonary edema or less likely asymmetric pneumonia. Clinical correlation is necessary IMPRESSION: Small bilateral pleural effusion pre There is mild right perihilar and bilateral basilar streaky interstitial prominence and patchy airspace opacification. Findings suspicious for mild asymmetric pulmonary edema  or less likely asymmetric pneumonia. Clinical correlation is necessary Electronically Signed   By: Lahoma Crocker M.D.   On: 07/18/2016 08:57   Dg Chest 2 View  Result Date: 07/16/2016 CLINICAL DATA:  Acute onset of shortness of breath. Initial encounter. EXAM: CHEST  2 VIEW COMPARISON:  Chest radiograph performed 06/14/2016 FINDINGS: The lungs are well-aerated. Small bilateral pleural effusions noted. Patchy bibasilar airspace opacities may reflect pulmonary edema or pneumonia. Underlying vascular congestion is seen. No pneumothorax is identified. The heart is enlarged.  No acute osseous abnormalities are seen. IMPRESSION: Small bilateral pleural effusions. Underlying vascular congestion and cardiomegaly. Patchy bibasilar airspace opacities may reflect pulmonary edema or pneumonia. Electronically Signed   By: Garald Balding M.D.   On: 07/16/2016 06:02   Ct Head Wo Contrast  Result Date: 07/18/2016 CLINICAL DATA:  83 year old male with right facial droop. Initial encounter. EXAM: CT HEAD WITHOUT CONTRAST TECHNIQUE: Contiguous axial images were obtained from the base of the skull through the vertex without intravenous contrast. COMPARISON:  04/10/2011 brain MR.  04/09/2011 head CT. FINDINGS: Brain: No intracranial hemorrhage. Hyperdense ectatic left internal carotid artery supraclinoid segment/carotid terminus. The hyperdensity may be related to calcifications although different than on prior CT and thrombus therefore not excluded. No other findings of large acute infarct. Prominent chronic microvascular changes. Remote right corona radiata infarct. Moderate global atrophy without hydrocephalus. No intracranial mass lesion noted on this unenhanced exam. Vascular: Vascular calcifications  with ectatic left internal carotid artery supraclinoid segment Skull: No acute abnormality. Sinuses/Orbits: Post lens replacement without acute orbital abnormality. Opacification right frontal sinus and mid to anterior right  ethmoid sinus air cells. Other: Mastoid air cells and middle ear cavities are clear. IMPRESSION: No intracranial hemorrhage. Hyperdense left internal carotid artery supraclinoid segment. Cannot exclude thrombus. Prominent chronic microvascular changes. Remote right corona radiata infarct. Moderate global atrophy without hydrocephalus. Opacification right frontal sinus and mid to anterior right ethmoid sinus air cells. These results will be called to the ordering clinician or representative by the Radiologist Assistant, and communication documented in the PACS or zVision Dashboard. Electronically Signed   By: Genia Del M.D.   On: 07/18/2016 15:45   Mr Jodene Nam Head Wo Contrast  Result Date: 07/18/2016 CLINICAL DATA:  Left internal carotid artery stenosis. EXAM: MRA HEAD WITHOUT CONTRAST TECHNIQUE: Angiographic images of the Circle of Willis were obtained using MRA technique without intravenous contrast. COMPARISON:  CT head 07/18/2016 FINDINGS: Image quality degraded by mild motion. Both vertebral arteries contribute to the basilar and are patent without stenosis. Basilar is tortuous but widely patent. Posterior cerebral arteries are patent bilaterally with a moderate stenosis proximal left posterior cerebral artery. Internal carotid artery widely patent bilaterally with tortuosity but no significant stenosis. Anterior cerebral artery patent bilaterally. Mild stenosis right M1 segment. Left M1 segment patent without significant stenosis. Note is made of calcification in the left M1 segment on CT. Middle cerebral artery branches not well visualized due to motion and small-vessel size. IMPRESSION: Moderate stenosis proximal left posterior cerebral artery. Mild stenosis right M1 segment.  Left M1 segment widely patent. Electronically Signed   By: Franchot Gallo M.D.   On: 07/18/2016 19:09   Dg Swallowing Func-speech Pathology  Result Date: 07/19/2016 Objective Swallowing Evaluation: Type of Study: MBS-Modified  Barium Swallow Study Patient Details Name: GURJOT BRISCO MRN: [id number] Date of Birth: 10-05-1932 Today's Date: 07/19/2016 Time: SLP Start Time (ACUTE ONLY): 0809-SLP Stop Time (ACUTE ONLY): 0838 SLP Time Calculation (min) (ACUTE ONLY): 29 min Past Medical History: Past Medical History: Diagnosis Date . Acute MI [facility]) Dec 2010  cardiac catheriziation and stenting of the LAD (bare metal) 03/14/09             . Anemia  . Angina  . Blood transfusion  . CAD (coronary artery disease)  . COPD (chronic obstructive pulmonary disease) ([place])  . DEMENTIA  . High cholesterol  . Hypertension  . LV dysfunction   EF 25 to 30% per echo May 2012 . NSTEMI (non-ST elevated myocardial infarction) [facility]) Nov 2011  with LV dysfunction . Prostatic hypertrophy  . Seizures ([place]) ~ 2006  "when blood pressure went up too high"; on Dilantin . Urethral stricture   with subsequent trauma and has an indwelling Foley catheter in place during his first MI                                                . Urinary catheter in place   chronic . Vision impairment   "legally blind" Past Surgical History: Past Surgical History: Procedure Laterality Date . CARDIAC CATHETERIZATION  02/2010 . CATARACT EXTRACTION    "both eyes; when I was small; I was born w/cataracrs" . CHOLECYSTECTOMY  05/30/11 . CHOLECYSTECTOMY  05/30/2011  Procedure: LAPAROSCOPIC CHOLECYSTECTOMY;  Surgeon: Judieth Keens, DO;  Location: [facility]  OR;  Service: General;  Laterality: N/A; . CORONARY ANGIOPLASTY WITH STENT PLACEMENT  03/2009  "1"; stent to LAD . TONSILLECTOMY    "when I was small" HPI: 83 y.o. 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 He presents today with increase SOB, productive cough and increase oxygen requirement. He denies chest pain.  Pt underwent MRI showing  neurological involvement.   Subjective: pt awake in chair Assessment / Plan / Recommendation CHL IP CLINICAL IMPRESSIONS 07/19/2016 Clinical Impression Moderate oral and mild pharyngeal dysphagia characterized by decreased oral coordination, lingual pumping and premature spillage of boluses into pharynx with mild oral residuals. Secretions retained in oral cavity mix with barium and spill into pharynx without pt awareness to clear.  Cued swallows helpful to clear portion of oral secretions.  Laryngeal penetration of thin to cords noted due to decreased timing of laryngeal closure.  Subtle throat clear removed barium from cords but suspect chronic low grade aspiration of thin and secretions.  Of not, pt also cleared his throat without laryngeal penetration of barium observed - presume secretion aspiration/penetration as source.  Use of chin tuck posture prevented penetration.  Recommend continue dys3/thin with precautions including chin tuck with liquids and dry swallows.  Using live video, educated pt to findings/recommendations and provided in writing.  Pt reports his "reading is not too good".  Will follow up for dysphagia management.  SLP Visit Diagnosis Dysphagia, oropharyngeal phase (R13.12) Attention and concentration deficit following -- Frontal lobe and executive function deficit following -- Impact on safety and function Moderate aspiration risk   CHL IP TREATMENT RECOMMENDATION 07/19/2016 Treatment Recommendations Therapy as outlined in treatment plan below   Prognosis 07/19/2016 Prognosis for Safe Diet Advancement Good Barriers to Reach Goals -- Barriers/Prognosis Comment -- CHL IP DIET RECOMMENDATION 07/19/2016 SLP Diet Recommendations Dysphagia 3 (Mech soft) solids;Thin liquid Liquid Administration via Straw Medication Administration Whole meds with puree Compensations Slow rate;Small sips/bites;Use straw to facilitate chin tuck;Chin tuck;Other (Comment) Postural Changes Seated upright at 90 degrees;Remain  semi-upright after after feeds/meals (Comment)   CHL IP OTHER RECOMMENDATIONS 07/19/2016 Recommended Consults -- Oral Care Recommendations Oral care QID Other Recommendations --   CHL IP FOLLOW UP RECOMMENDATIONS 07/19/2016 Follow up Recommendations Skilled Nursing facility   [facility] IP FREQUENCY AND DURATION 07/19/2016 Speech Therapy Frequency (ACUTE ONLY) min 2x/week Treatment Duration 2 weeks      CHL IP ORAL PHASE 07/19/2016 Oral Phase -- Oral - Pudding Teaspoon -- Oral - Pudding Cup -- Oral - Honey Teaspoon -- Oral - Honey Cup -- Oral - Nectar Teaspoon -- Oral - Nectar Cup Weak lingual manipulation;Decreased bolus cohesion;Premature spillage;Lingual pumping Oral - Nectar Straw -- Oral - Thin Teaspoon -- Oral - Thin Cup Weak lingual manipulation;Premature spillage;Decreased bolus cohesion;Lingual pumping Oral - Thin Straw Weak lingual manipulation;Lingual pumping;Piecemeal swallowing;Premature spillage;Decreased bolus cohesion Oral - Puree Weak lingual manipulation;Delayed oral transit;Lingual pumping;Premature spillage Oral - Mech Soft Weak lingual manipulation;Impaired mastication;Delayed oral transit;Lingual pumping;Decreased bolus cohesion;Premature spillage Oral - Regular -- Oral - Multi-Consistency -- Oral - Pill Weak lingual manipulation;Lingual pumping;Premature spillage;Decreased bolus cohesion Oral Phase - Comment --  CHL IP PHARYNGEAL PHASE 07/19/2016 Pharyngeal Phase Impaired Pharyngeal- Pudding Teaspoon -- Pharyngeal -- Pharyngeal- Pudding Cup -- Pharyngeal -- Pharyngeal- Honey Teaspoon -- Pharyngeal -- Pharyngeal- Honey Cup -- Pharyngeal -- Pharyngeal- Nectar Teaspoon -- Pharyngeal --  Pharyngeal- Nectar Cup Pharyngeal residue - pyriform Pharyngeal -- Pharyngeal- Nectar Straw -- Pharyngeal -- Pharyngeal- Thin Teaspoon -- Pharyngeal -- Pharyngeal- Thin Cup Pharyngeal residue - pyriform;Penetration/Aspiration during swallow Pharyngeal Material enters airway, CONTACTS cords and then ejected out Pharyngeal- Thin  Straw Pharyngeal residue - pyriform Pharyngeal -- Pharyngeal- Puree Pharyngeal residue - pyriform Pharyngeal -- Pharyngeal- Mechanical Soft Pharyngeal residue - pyriform Pharyngeal -- Pharyngeal- Regular -- Pharyngeal -- Pharyngeal- Multi-consistency -- Pharyngeal -- Pharyngeal- Pill Pharyngeal residue - pyriform Pharyngeal -- Pharyngeal Comment --  CHL IP CERVICAL ESOPHAGEAL PHASE 07/19/2016 Cervical Esophageal Phase WFL Pudding Teaspoon -- Pudding Cup -- Honey Teaspoon -- Honey Cup -- Nectar Teaspoon -- Nectar Cup -- Nectar Straw -- Thin Teaspoon -- Thin Cup -- Thin Straw -- Puree -- Mechanical Soft -- Regular -- Multi-consistency -- Pill -- Cervical Esophageal Comment -- CHL IP GO 07/16/2016 Functional Assessment Tool Used (No Data) Functional Limitations Swallowing Swallow Current Status (E5277) CI Swallow Goal Status (O2423) CI Swallow Discharge Status (N3614) (None) Motor Speech Current Status (E3154) (None) Motor Speech Goal Status (M0867) (None) Motor Speech Goal Status (Y1950) (None) Spoken Language Comprehension Current Status (D3267) (None) Spoken Language Comprehension Goal Status (T2458) (None) Spoken Language Comprehension Discharge Status (K9983) (None) Spoken Language Expression Current Status (J8250) (None) Spoken Language Expression Goal Status (N3976) (None) Spoken Language Expression Discharge Status [id number]) (None) Attention Current Status (F7902) (None) Attention Goal Status (I0973) (None) Attention Discharge Status (Z3299) (None) Memory Current Status (M4268) (None) Memory Goal Status (T4196) (None) Memory Discharge Status (Q2297) (None) Voice Current Status (L8921) (None) Voice Goal Status (J9417) (None) Voice Discharge Status (E0814) (None) Other Speech-Language Pathology Functional Limitation Current Status (G8185) (None) Other Speech-Language Pathology Functional Limitation Goal Status (U3149) (None) Other Speech-Language Pathology Functional Limitation Discharge Status [phone]) (None)  Macario Golds 07/19/2016, 8:49 AM               Assessment/Plan  Generalized weakness From physical deconditioning with recent CHF exacerbation. Will have him work with physical therapy and occupational therapy team to help with gait training and muscle strengthening exercises.fall precautions. Skin care. Encourage to be out of bed.   Chronic respiratory failure With recent acute exacerbation. Continue oxygen 3 L by nasal cannula. Provide incentive spirometry 10 encourage use to prevent atelectasis. Continue medical management for CHF and COPD and monitor clinically  Chronic combined CHF Recent acute on chronic CHF exacerbation. Continue carvedilol 6.25 mg twice a day, Lasix 80 mg daily, spironolactone 25 mg daily, Cozaar 25 mg daily. Monitor blood pressure and the MP. Monitor weight on a daily basis for next 2 weeks and then 3 days a week. If has more than 3 pounds weight gain can lead to notify provider to adjust diuretics and for further clinical assessment.  Citrobacter UTI Of ciprofloxacin and currently on Bactrim double strength 1 tab twice a day, continue this for a total of 2 week course and at [facility] to 50 mg twice a day for probiotic to prevent antibiotic associated diarrhea. Maintain hydration and provide Foley care. Start by radium 100 mg twice a day for 3 days and monitor  COPD Breathing currently stable, continue oxygen 3 L by nasal cannula and bronchodilator treatment. Monitor for any signs of COPD exacerbation. Continue cough expectorant.  Dysphagia Aspiration precautions to be taken. Get speech pathology consult. Continue Robinul for now to help decrease oral secretions.  Anemia of chronic disease Monitor CBC periodically  Seizure disorder Remains seizure-free. Continue phenytoin 200 mg twice a day  Coronary  artery disease Currently on baby aspirin, Coreg, atorvastatin, losartan, spironolactone. Remains chest pain-free. On nitroglycerin on a needed basis for  chest pain. Monitor clinically  OAB Currently on Myrbetriq with Foley catheter. Monitor urine output.  Dementia Currently on donepezil 10 mg daily, provide supportive care     Goals of care: short term rehabilitation   Labs/tests ordered: CBC, CMP 07/27/2016  Family/ staff Communication: reviewed care plan with patient and nursing supervisor  I have spent greater than 50 minutes for this encounter which includes reviewing hospital records, addressing above mentioned concerns, reviewing care plan with patient, answering patient's concerns and counseling.     Blanchie Serve, MD Internal Medicine [facility] [address] [place], [place] [address] Cell Phone (Monday-Friday 8 am - 5 pm): [phone] On Call: [phone] and follow prompts after 5 pm and on weekends Office Phone: [phone] Office Fax: [phone]

## 2016-07-27 LAB — CBC AND DIFFERENTIAL
HCT: 36 % — AB (ref 41–53)
HEMOGLOBIN: 11.7 g/dL — AB (ref 13.5–17.5)
PLATELETS: 207 10*3/uL (ref 150–399)
WBC: 6.4 10*3/mL

## 2016-07-27 LAB — BASIC METABOLIC PANEL
BUN: 22 mg/dL — AB (ref 4–21)
CREATININE: 1.2 mg/dL (ref 0.6–1.3)
Glucose: 81 mg/dL
Potassium: 4.6 mmol/L (ref 3.4–5.3)
Sodium: 139 mmol/L (ref 137–147)

## 2016-08-02 ENCOUNTER — Encounter: Payer: Self-pay | Admitting: Family

## 2016-08-02 ENCOUNTER — Non-Acute Institutional Stay (SKILLED_NURSING_FACILITY): Payer: Medicare Other | Admitting: Family

## 2016-08-02 DIAGNOSIS — R301 Vesical tenesmus: Secondary | ICD-10-CM

## 2016-08-02 DIAGNOSIS — R635 Abnormal weight gain: Secondary | ICD-10-CM

## 2016-08-02 MED ORDER — PHENAZOPYRIDINE HCL 100 MG PO TABS: 100.0000 mg | ORAL_TABLET | Freq: Two times a day (BID) | ORAL | 0 refills | Status: DC

## 2016-08-02 NOTE — Progress Notes (Signed)
Location:  [facility] Room Number: 2094 Place of Service:  SNF (31) Provider: Dinah Ngetich FNP-C  Tivis Ringer, MD  Patient Care Team: Prince Solian, MD as PCP - General (Internal Medicine)  Extended Emergency Contact Information Primary Emergency Contact: Burnett,[name] Address: [address]          [place], [place] [address] [place] Mobile Phone: [phone] Relation: Relative Secondary Emergency Contact: Herring,Gaynell  [place] [employer] Phone: [phone] Relation: Other  Code Status:  Full Code  Goals of care: Advanced Directive information Advanced Directives 08/02/2016  Does Patient Have a Medical Advance Directive? No  Type of Advance Directive -  Does patient want to make changes to medical advance directive? -  Copy of [place] in Chart? -  Pre-existing out of facility DNR order (yellow form or pink MOST form) -     Chief Complaint  Patient presents with  . Acute Visit    weight gain, Bladder pain     HPI:  Pt is a 83 y.o. male seen today at [facility] and rehabilitation for an acute visit for evaluation of abrupt weight gain and bladder pain.He has a medical history of urinary retention indwelling chronic foley catheter, CHF, COPD, Seizures,CAD, Dementia among other conditions.He is seen in his room today. He complains of intermittent pain over bladder area. He states pain kept him awake last night. He is currently on tramadol for pain. He is also on Bactrim DS every 12 hours until 08/03/2016 for recent UTI. Facility Nurse reports patient has had a 5 pound weight gain over two days. Patient's weight log reviewed showed discrepancy previous weight 151 lbs ( 07/20/2016); 139.4 lbs ( 07/21/2016) and 142.6 lbs ( 07/23/2016).    Past Medical History:  Diagnosis Date  . Acute MI [facility]) Dec 2010   cardiac catheriziation and stenting of the LAD (bare metal) 03/14/09              .  Anemia   . Angina   . Blood transfusion   . CAD (coronary artery disease)   . COPD (chronic obstructive pulmonary disease) ([place])   . DEMENTIA   . High cholesterol   . Hypertension   . LV dysfunction    EF 25 to 30% per echo May 2012  . NSTEMI (non-ST elevated myocardial infarction) [facility]) Nov 2011   with LV dysfunction  . Prostatic hypertrophy   . Seizures ([place]) ~ 2006   "when blood pressure went up too high"; on Dilantin  . Urethral stricture    with subsequent trauma and has an indwelling Foley catheter in place during his first MI                                                 . Urinary catheter in place    chronic  . Vision impairment    "legally blind"   Past Surgical History:  Procedure Laterality Date  . CARDIAC CATHETERIZATION  02/2010  . 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 CHOLECYSTECTOMY  05/30/11  . CHOLECYSTECTOMY  05/30/2011   Procedure: LAPAROSCOPIC CHOLECYSTECTOMY;  Surgeon: Judieth Keens, DO;  Location: [place];  Service: General;  Laterality: N/A;  . CORONARY ANGIOPLASTY WITH STENT PLACEMENT  03/2009   "  1"; stent to LAD  . TONSILLECTOMY     "when I was small"    Allergies  Allergen Reactions  . Red Dye Other (See Comments)    Per Mar.  . Iodinated Diagnostic Agents Rash    Pt given 50 mg Benadryl prior to Omnipaque injection, tolerated well.    Allergies as of 08/02/2016      Reactions   Red Dye Other (See Comments)   Per Mar.   Iodinated Diagnostic Agents Rash   Pt given 50 mg Benadryl prior to Omnipaque injection, tolerated well.      Medication List       Accurate as of 08/02/16  2:49 PM. Always use your most recent med list.          acetaminophen 500 MG tablet Commonly known as:  TYLENOL Take 1,000 mg by mouth every 8 (eight) hours as needed for moderate pain.   aspirin 81 MG chewable tablet Chew 81 mg by mouth daily.   atorvastatin 10 MG tablet Commonly known as:  LIPITOR Take 10 mg  by mouth daily.   bismuth subsalicylate 056 PV/94IA suspension Commonly known as:  PEPTO BISMOL Take 30 mLs by mouth as needed for diarrhea or loose stools.   carvedilol 6.25 MG tablet Commonly known as:  COREG Take 6.25 mg by mouth 2 (two) times daily with a meal.   cholecalciferol 1000 units tablet Commonly known as:  VITAMIN D Take 1,000 Units by mouth daily.   DELSYM 30 MG/5ML liquid Generic drug:  dextromethorphan Take 10 mLs by mouth daily as needed for cough.   donepezil 10 MG tablet Commonly known as:  ARICEPT Take 10 mg by mouth at bedtime.   fluticasone furoate-vilanterol 100-25 MCG/INH Aepb Commonly known as:  BREO ELLIPTA Inhale 1 puff into the lungs daily with breakfast.   furosemide 80 MG tablet Commonly known as:  LASIX Take 1 tablet (80 mg total) by mouth daily.   glycopyrrolate 1 MG tablet Commonly known as:  ROBINUL Take 1 tablet (1 mg total) by mouth 2 (two) times daily.   guaiFENesin 600 MG 12 hr tablet Commonly known as:  MUCINEX Take 1 tablet (600 mg total) by mouth 2 (two) times daily.   loperamide 2 MG capsule Commonly known as:  IMODIUM Take 1 capsule (2 mg total) by mouth 4 (four) times daily as needed for diarrhea or loose stools.   losartan 25 MG tablet Commonly known as:  COZAAR Take 25 mg by mouth daily.   mirabegron ER 50 MG Tb24 tablet Commonly known as:  MYRBETRIQ Take 50 mg by mouth daily with breakfast.   nitroGLYCERIN 0.3 MG SL tablet Commonly known as:  NITROSTAT Place 0.3 mg under the tongue every 5 (five) minutes as needed for chest pain.   ORAJEL MOUTH-AID 20 % Gel Generic drug:  benzocaine Use as directed 1 application in the mouth or throat 4 (four) times daily as needed (gum pain).   phenytoin 100 MG ER capsule Commonly known as:  DILANTIN Take 200 mg by mouth 2 (two) times daily.   PREPARATION H HYDROCORTISONE 1 % Generic drug:  hydrocortisone cream Apply 1 application topically 2 (two) times daily as needed.  For hemmoroids.   spironolactone 25 MG tablet Commonly known as:  ALDACTONE Take 25 mg by mouth every morning.       Review of Systems  Constitutional: Negative for activity change, appetite change, chills, fatigue and fever.  HENT: Negative for congestion, rhinorrhea, sinus pain, sinus pressure,  sneezing and sore throat.   Eyes: Negative.   Respiratory: Negative for cough, chest tightness, shortness of breath and wheezing.   Cardiovascular: Negative for chest pain, palpitations and leg swelling.  Gastrointestinal: Negative for abdominal distention, abdominal pain, constipation, diarrhea, nausea and vomiting.  Genitourinary: Negative for flank pain, frequency and urgency.       Bladder pain   Musculoskeletal: Positive for gait problem.  Skin: Negative for color change, pallor and rash.  Psychiatric/Behavioral: Negative for agitation, confusion and hallucinations. The patient is not nervous/anxious.     Immunization History  Administered Date(s) Administered  . PPD Test 05/30/2011, 07/20/2016   Pertinent  Health Maintenance Due  Topic Date Due  . PNA vac Low Risk Adult (1 of 2 - PCV13) 03/03/2000  . INFLUENZA VACCINE  11/01/2016    Vitals:   08/02/16 1341  BP: 135/80  Pulse: 72  Resp: 18  Temp: 97.2 F (36.2 C)  SpO2: 98%  Weight: 139 lb 2 oz (63.1 kg)  Height: 5\' 8"  (1.727 m)   Body mass index is 21.15 kg/m. Physical Exam  Constitutional: He is oriented to person, place, and time. He appears well-developed and well-nourished. No distress.  HENT:  Head: Normocephalic.  Mouth/Throat: Oropharynx is clear and moist. No oropharyngeal exudate.  Eyes: Conjunctivae and EOM are normal. Pupils are equal, round, and reactive to light. Right eye exhibits no discharge. Left eye exhibits no discharge. No scleral icterus.  Neck: Normal range of motion. No JVD present. No thyromegaly present.  Cardiovascular: Normal rate, regular rhythm, normal heart sounds and intact distal  pulses.  Exam reveals no gallop and no friction rub.   No murmur heard. Pulmonary/Chest: Effort normal and breath sounds normal. No respiratory distress. He has no wheezes. He has no rales.  Oxygen 2 Liters [place]   Abdominal: Soft. Bowel sounds are normal. He exhibits no distension. There is no tenderness. There is no rebound.  Guarding suprapubic area  Genitourinary:  Genitourinary Comments: Foley catheter draining adequate amounts of urine.   Musculoskeletal: He exhibits no edema, tenderness or deformity.  Unsteady gait. bilat. Lower extremities weakness.   Lymphadenopathy:    He has no cervical adenopathy.  Neurological: He is oriented to person, place, and time.  Skin: Skin is warm. No rash noted. No erythema. No pallor.  Psychiatric: He has a normal mood and affect.    Labs reviewed:  Recent Labs  07/18/16 0609 07/19/16 0629 07/20/16 0548 07/27/16  NA 139 139 137 139  K 4.0 4.0 4.2 4.6  CL 109 108 103  --   CO2 22 25 27   --   GLUCOSE 86 85 85  --   BUN 20 17 16  22*  CREATININE 1.14 1.12 1.18 1.2  CALCIUM 8.6* 8.7* 9.0  --     Recent Labs  02/27/16 2345 06/13/16 0519 07/16/16 0525  AST 22 29 20   ALT 16* 20 18  ALKPHOS 73 85 66  BILITOT 0.6 0.7 0.6  PROT 6.6 7.4 6.5  ALBUMIN 3.7 4.0 3.8    Recent Labs  05/17/16 1916 06/13/16 0538  07/16/16 0525  07/18/16 0609 07/19/16 0629 07/20/16 0548 07/27/16  WBC 7.6 12.0*  < > 6.6  < > 6.6 5.9 6.1 6.4  NEUTROABS 4.9 9.5*  --  4.6  --   --   --   --   --   HGB 13.2 12.9*  < > 11.8*  < > 11.9* 11.8* 12.4* 11.7*  HCT 38.9* 39.2  < >  35.6*  < > 35.8* 36.4* 37.6* 36*  MCV 89.6 90.1  < > 91.0  < > 91.6 90.3 92.2  --   PLT 235 248  < > 198  < > 196 190 195 207  < > = values in this interval not displayed. Lab Results  Component Value Date   TSH 0.610 07/01/2015   Lab Results  Component Value Date   HGBA1C 5.3 07/01/2015   Lab Results  Component Value Date   CHOL  02/10/2010    139        ATP III  CLASSIFICATION:  <200     mg/dL   Desirable  200-239  mg/dL   Borderline High  >=240    mg/dL   High          HDL 48 02/10/2010   LDLCALC  02/10/2010    83        Total Cholesterol/HDL:CHD Risk Coronary Heart Disease Risk Table                     Men   Women  1/2 Average Risk   3.4   3.3  Average Risk       5.0   4.4  2 X Average Risk   9.6   7.1  3 X Average Risk  23.4   11.0        Use the calculated Patient Ratio above and the CHD Risk Table to determine the patient's CHD Risk.        ATP III CLASSIFICATION (LDL):  <100     mg/dL   Optimal  100-129  mg/dL   Near or Above                    Optimal  130-159  mg/dL   Borderline  160-189  mg/dL   High  >190     mg/dL   Very High   TRIG 42 02/10/2010   CHOLHDL 2.9 02/10/2010   Significant Diagnostic Results in last 30 days:  Dg Chest 2 View  Result Date: 07/20/2016 CLINICAL DATA:  History of COPD, coronary artery disease, and pulmonary edema, now with increased shortness of breath, productive cough, and increased oxygen requirement. EXAM: CHEST  2 VIEW COMPARISON:  Chest x-ray of July 18, 2016 FINDINGS: The lungs remain hyperinflated. The pulmonary interstitial markings have improved. The pulmonary vascularity is less engorged. The heart is mildly enlarged but less conspicuous today. There is calcification in the wall of the thoracic aorta. The mediastinum is normal in width. There is no significant pleural effusion. IMPRESSION: COPD. Improved appearance of the pulmonary interstitium bilaterally consistent with decreasing interstitial edema. Persistent mild cardiomegaly with mild central pulmonary vascular congestion. No pneumonia. Thoracic aortic atherosclerosis. Electronically Signed   By: David  Martinique M.D.   On: 07/20/2016 10:59   Dg Chest 2 View  Result Date: 07/18/2016 CLINICAL DATA:  Acute respiratory failure, hypoxia EXAM: CHEST  2 VIEW COMPARISON:  07/16/2016 FINDINGS: Cardiomediastinal silhouette is stable. Small  bilateral pleural effusion pre There is mild right perihilar and bilateral basilar streaky interstitial prominence and patchy airspace opacification. Findings suspicious for mild asymmetric pulmonary edema or less likely asymmetric pneumonia. Clinical correlation is necessary IMPRESSION: Small bilateral pleural effusion pre There is mild right perihilar and bilateral basilar streaky interstitial prominence and patchy airspace opacification. Findings suspicious for mild asymmetric pulmonary edema or less likely asymmetric pneumonia. Clinical correlation is necessary Electronically Signed   By: Orlean Bradford.D.  On: 07/18/2016 08:57   Dg Chest 2 View  Result Date: 07/16/2016 CLINICAL DATA:  Acute onset of shortness of breath. Initial encounter. EXAM: CHEST  2 VIEW COMPARISON:  Chest radiograph performed 06/14/2016 FINDINGS: The lungs are well-aerated. Small bilateral pleural effusions noted. Patchy bibasilar airspace opacities may reflect pulmonary edema or pneumonia. Underlying vascular congestion is seen. No pneumothorax is identified. The heart is enlarged.  No acute osseous abnormalities are seen. IMPRESSION: Small bilateral pleural effusions. Underlying vascular congestion and cardiomegaly. Patchy bibasilar airspace opacities may reflect pulmonary edema or pneumonia. Electronically Signed   By: Garald Balding M.D.   On: 07/16/2016 06:02   Ct Head Wo Contrast  Result Date: 07/18/2016 CLINICAL DATA:  83 year old male with right facial droop. Initial encounter. EXAM: CT HEAD WITHOUT CONTRAST TECHNIQUE: Contiguous axial images were obtained from the base of the skull through the vertex without intravenous contrast. COMPARISON:  04/10/2011 brain MR.  04/09/2011 head CT. FINDINGS: Brain: No intracranial hemorrhage. Hyperdense ectatic left internal carotid artery supraclinoid segment/carotid terminus. The hyperdensity may be related to calcifications although different than on prior CT and thrombus therefore not  excluded. No other findings of large acute infarct. Prominent chronic microvascular changes. Remote right corona radiata infarct. Moderate global atrophy without hydrocephalus. No intracranial mass lesion noted on this unenhanced exam. Vascular: Vascular calcifications with ectatic left internal carotid artery supraclinoid segment Skull: No acute abnormality. Sinuses/Orbits: Post lens replacement without acute orbital abnormality. Opacification right frontal sinus and mid to anterior right ethmoid sinus air cells. Other: Mastoid air cells and middle ear cavities are clear. IMPRESSION: No intracranial hemorrhage. Hyperdense left internal carotid artery supraclinoid segment. Cannot exclude thrombus. Prominent chronic microvascular changes. Remote right corona radiata infarct. Moderate global atrophy without hydrocephalus. Opacification right frontal sinus and mid to anterior right ethmoid sinus air cells. These results will be called to the ordering clinician or representative by the Radiologist Assistant, and communication documented in the PACS or zVision Dashboard. Electronically Signed   By: Genia Del M.D.   On: 07/18/2016 15:45   Mr Jodene Nam Head Wo Contrast  Result Date: 07/18/2016 CLINICAL DATA:  Left internal carotid artery stenosis. EXAM: MRA HEAD WITHOUT CONTRAST TECHNIQUE: Angiographic images of the Circle of Willis were obtained using MRA technique without intravenous contrast. COMPARISON:  CT head 07/18/2016 FINDINGS: Image quality degraded by mild motion. Both vertebral arteries contribute to the basilar and are patent without stenosis. Basilar is tortuous but widely patent. Posterior cerebral arteries are patent bilaterally with a moderate stenosis proximal left posterior cerebral artery. Internal carotid artery widely patent bilaterally with tortuosity but no significant stenosis. Anterior cerebral artery patent bilaterally. Mild stenosis right M1 segment. Left M1 segment patent without significant  stenosis. Note is made of calcification in the left M1 segment on CT. Middle cerebral artery branches not well visualized due to motion and small-vessel size. IMPRESSION: Moderate stenosis proximal left posterior cerebral artery. Mild stenosis right M1 segment.  Left M1 segment widely patent. Electronically Signed   By: Franchot Gallo M.D.   On: 07/18/2016 19:09   Dg Swallowing Func-speech Pathology  Result Date: 07/19/2016 Objective Swallowing Evaluation: Type of Study: MBS-Modified Barium Swallow Study Patient Details Name: EMMONS TOTH MRN: [id number] Date of Birth: Jan 10, 1933 Today's Date: 07/19/2016 Time: SLP Start Time (ACUTE ONLY): 0809-SLP Stop Time (ACUTE ONLY): 0838 SLP Time Calculation (min) (ACUTE ONLY): 29 min Past Medical History: Past Medical History: Diagnosis Date . Acute MI [facility]) Dec 2010  cardiac catheriziation and stenting of the LAD (  bare metal) 03/14/09             . Anemia  . Angina  . Blood transfusion  . CAD (coronary artery disease)  . COPD (chronic obstructive pulmonary disease) ([place])  . DEMENTIA  . High cholesterol  . Hypertension  . LV dysfunction   EF 25 to 30% per echo May 2012 . NSTEMI (non-ST elevated myocardial infarction) [facility]) Nov 2011  with LV dysfunction . Prostatic hypertrophy  . Seizures ([place]) ~ 2006  "when blood pressure went up too high"; on Dilantin . Urethral stricture   with subsequent trauma and has an indwelling Foley catheter in place during his first MI                                                . Urinary catheter in place   chronic . Vision impairment   "legally blind" Past Surgical History: Past Surgical History: Procedure Laterality Date . CARDIAC CATHETERIZATION  02/2010 . CATARACT EXTRACTION    "both eyes; when I was small; I was born w/cataracrs" . CHOLECYSTECTOMY  05/30/11 . CHOLECYSTECTOMY  05/30/2011  Procedure: LAPAROSCOPIC CHOLECYSTECTOMY;  Surgeon: Judieth Keens, DO;  Location: [place];  Service: General;  Laterality: N/A; . CORONARY ANGIOPLASTY  WITH STENT PLACEMENT  03/2009  "1"; stent to LAD . TONSILLECTOMY    "when I was small" HPI: 83 y.o. 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 He presents today with increase SOB, productive cough and increase oxygen requirement. He denies chest pain.  Pt underwent MRI showing neurological involvement.   Subjective: pt awake in chair Assessment / Plan / Recommendation CHL IP CLINICAL IMPRESSIONS 07/19/2016 Clinical Impression Moderate oral and mild pharyngeal dysphagia characterized by decreased oral coordination, lingual pumping and premature spillage of boluses into pharynx with mild oral residuals. Secretions retained in oral cavity mix with barium and spill into pharynx without pt awareness to clear.  Cued swallows helpful to clear portion of oral secretions.  Laryngeal penetration of thin to cords noted due to decreased timing of laryngeal closure.  Subtle throat clear removed barium from cords but suspect chronic low grade aspiration of thin and secretions.  Of not, pt also cleared his throat without laryngeal penetration of barium observed - presume secretion aspiration/penetration as source.  Use of chin tuck posture prevented penetration.  Recommend continue dys3/thin with precautions including chin tuck with liquids and dry swallows.  Using live video, educated pt to findings/recommendations and provided in writing.  Pt reports his "reading is not too good".  Will follow up for dysphagia management.  SLP Visit Diagnosis Dysphagia, oropharyngeal phase (R13.12) Attention and concentration deficit following -- Frontal lobe and executive function deficit following -- Impact on safety and function Moderate aspiration risk   CHL IP TREATMENT RECOMMENDATION 07/19/2016 Treatment Recommendations Therapy as  outlined in treatment plan below   Prognosis 07/19/2016 Prognosis for Safe Diet Advancement Good Barriers to Reach Goals -- Barriers/Prognosis Comment -- CHL IP DIET RECOMMENDATION 07/19/2016 SLP Diet Recommendations Dysphagia 3 (Mech soft) solids;Thin liquid Liquid Administration via Straw Medication Administration Whole meds with puree Compensations Slow rate;Small sips/bites;Use straw to facilitate chin tuck;Chin tuck;Other (Comment) Postural Changes Seated upright  at 90 degrees;Remain semi-upright after after feeds/meals (Comment)   CHL IP OTHER RECOMMENDATIONS 07/19/2016 Recommended Consults -- Oral Care Recommendations Oral care QID Other Recommendations --   CHL IP FOLLOW UP RECOMMENDATIONS 07/19/2016 Follow up Recommendations Skilled Nursing facility   [facility] IP FREQUENCY AND DURATION 07/19/2016 Speech Therapy Frequency (ACUTE ONLY) min 2x/week Treatment Duration 2 weeks      CHL IP ORAL PHASE 07/19/2016 Oral Phase -- Oral - Pudding Teaspoon -- Oral - Pudding Cup -- Oral - Honey Teaspoon -- Oral - Honey Cup -- Oral - Nectar Teaspoon -- Oral - Nectar Cup Weak lingual manipulation;Decreased bolus cohesion;Premature spillage;Lingual pumping Oral - Nectar Straw -- Oral - Thin Teaspoon -- Oral - Thin Cup Weak lingual manipulation;Premature spillage;Decreased bolus cohesion;Lingual pumping Oral - Thin Straw Weak lingual manipulation;Lingual pumping;Piecemeal swallowing;Premature spillage;Decreased bolus cohesion Oral - Puree Weak lingual manipulation;Delayed oral transit;Lingual pumping;Premature spillage Oral - Mech Soft Weak lingual manipulation;Impaired mastication;Delayed oral transit;Lingual pumping;Decreased bolus cohesion;Premature spillage Oral - Regular -- Oral - Multi-Consistency -- Oral - Pill Weak lingual manipulation;Lingual pumping;Premature spillage;Decreased bolus cohesion Oral Phase - Comment --  CHL IP PHARYNGEAL PHASE 07/19/2016 Pharyngeal Phase Impaired Pharyngeal- Pudding Teaspoon -- Pharyngeal --  Pharyngeal- Pudding Cup -- Pharyngeal -- Pharyngeal- Honey Teaspoon -- Pharyngeal -- Pharyngeal- Honey Cup -- Pharyngeal -- Pharyngeal- Nectar Teaspoon -- Pharyngeal -- Pharyngeal- Nectar Cup Pharyngeal residue - pyriform Pharyngeal -- Pharyngeal- Nectar Straw -- Pharyngeal -- Pharyngeal- Thin Teaspoon -- Pharyngeal -- Pharyngeal- Thin Cup Pharyngeal residue - pyriform;Penetration/Aspiration during swallow Pharyngeal Material enters airway, CONTACTS cords and then ejected out Pharyngeal- Thin Straw Pharyngeal residue - pyriform Pharyngeal -- Pharyngeal- Puree Pharyngeal residue - pyriform Pharyngeal -- Pharyngeal- Mechanical Soft Pharyngeal residue - pyriform Pharyngeal -- Pharyngeal- Regular -- Pharyngeal -- Pharyngeal- Multi-consistency -- Pharyngeal -- Pharyngeal- Pill Pharyngeal residue - pyriform Pharyngeal -- Pharyngeal Comment --  CHL IP CERVICAL ESOPHAGEAL PHASE 07/19/2016 Cervical Esophageal Phase WFL Pudding Teaspoon -- Pudding Cup -- Honey Teaspoon -- Honey Cup -- Nectar Teaspoon -- Nectar Cup -- Nectar Straw -- Thin Teaspoon -- Thin Cup -- Thin Straw -- Puree -- Mechanical Soft -- Regular -- Multi-consistency -- Pill -- Cervical Esophageal Comment -- CHL IP GO 07/16/2016 Functional Assessment Tool Used (No Data) Functional Limitations Swallowing Swallow Current Status (R5188) CI Swallow Goal Status (C1660) CI Swallow Discharge Status (Y3016) (None) Motor Speech Current Status (W1093) (None) Motor Speech Goal Status (A3557) (None) Motor Speech Goal Status (D2202) (None) Spoken Language Comprehension Current Status (R4270) (None) Spoken Language Comprehension Goal Status (W2376) (None) Spoken Language Comprehension Discharge Status (E8315) (None) Spoken Language Expression Current Status (V7616) (None) Spoken Language Expression Goal Status (W7371) (None) Spoken Language Expression Discharge Status (G6269) (None) Attention Current Status (S8546) (None) Attention Goal Status (E7035) (None) Attention Discharge  Status (K0938) (None) Memory Current Status (H8299) (None) Memory Goal Status (B7169) (None) Memory Discharge Status (C7893) (None) Voice Current Status (Y1017) (None) Voice Goal Status (P1025) (None) Voice Discharge Status (E5277) (None) Other Speech-Language Pathology Functional Limitation Current Status (O2423) (None) Other Speech-Language Pathology Functional Limitation Goal Status (N3614) (None) Other Speech-Language Pathology Functional Limitation Discharge Status [phone]) (None) Macario Golds 07/19/2016, 8:49 AM             Assessment/Plan 1. Weight gain previous weight 151 lbs ( 07/20/2016); 139.4 lbs ( 07/21/2016) and 142.6 lbs ( 07/23/2016).Exam findings negative for edema, shortness of breath or wheezing. Continue to monitor weight. Continue on Furosemide 80 daily.   2. Painful bladder spasm Afebrile. Currently on Bactrim DS for  recent UTI. Foley Catheter draining adequate amounts of urine. Will continue on current pain regimen. Add Pyridium 100 mg Tablet twice daily   Family/ staff Communication: Reviewed plan of care with patient and facility Nurse supervisor  Labs/tests ordered: None   Sandrea Hughs, NP

## 2016-08-04 ENCOUNTER — Non-Acute Institutional Stay (SKILLED_NURSING_FACILITY): Payer: Medicare Other | Admitting: Family

## 2016-08-04 ENCOUNTER — Encounter: Payer: Self-pay | Admitting: Family

## 2016-08-04 DIAGNOSIS — Z960 Presence of urogenital implants: Secondary | ICD-10-CM | POA: Diagnosis not present

## 2016-08-04 DIAGNOSIS — F0390 Unspecified dementia without behavioral disturbance: Secondary | ICD-10-CM

## 2016-08-04 DIAGNOSIS — G40909 Epilepsy, unspecified, not intractable, without status epilepticus: Secondary | ICD-10-CM

## 2016-08-04 DIAGNOSIS — R2681 Unsteadiness on feet: Secondary | ICD-10-CM | POA: Diagnosis not present

## 2016-08-04 DIAGNOSIS — I110 Hypertensive heart disease with heart failure: Secondary | ICD-10-CM | POA: Diagnosis not present

## 2016-08-04 DIAGNOSIS — I5040 Unspecified combined systolic (congestive) and diastolic (congestive) heart failure: Secondary | ICD-10-CM

## 2016-08-04 DIAGNOSIS — I5042 Chronic combined systolic (congestive) and diastolic (congestive) heart failure: Secondary | ICD-10-CM

## 2016-08-04 DIAGNOSIS — E782 Mixed hyperlipidemia: Secondary | ICD-10-CM

## 2016-08-04 DIAGNOSIS — Z978 Presence of other specified devices: Secondary | ICD-10-CM

## 2016-08-04 NOTE — Progress Notes (Signed)
Patient ID: [name], male   DOB: 1932/12/07, 83 y.o.   MRN: [id number]

## 2016-08-04 NOTE — Progress Notes (Signed)
Location:  [facility] Room Number: 1007-P Place of Service:  SNF (31)  Provider: Marlowe Sax FNP-C   PCP: Tivis Ringer, MD Patient Care Team: Allen Solian, MD as PCP - General (Internal Medicine)  Extended Emergency Contact Information Primary Emergency Contact: Burnett,[name] Address: [address]          [place], [place] [address] [place] Mobile Phone: [phone] Relation: Relative Secondary Emergency Contact: Herring,Gaynell  [place] Mobile Phone: [phone] Relation: Other  Code Status: Full code  Goals of care:  Advanced Directive information Advanced Directives 08/02/2016  Does Patient Have a Medical Advance Directive? No  Type of Advance Directive -  Does patient want to make changes to medical advance directive? -  Copy of [place] in Chart? -  Pre-existing out of facility DNR order (yellow form or pink MOST form) -     Allergies  Allergen Reactions  . Red Dye Other (See Comments)    Per Mar.  . Iodinated Diagnostic Agents Rash    Pt given 50 mg Benadryl prior to Omnipaque injection, tolerated well.    Chief Complaint  Patient presents with  . Discharge Note    HPI:  83 y.o. male seen today at [facility] for discharge home.He was here for short term rehabilitation for post hospital admission from 07/16/2016- 07/20/2016 with worsening dyspnea. He was diagnosed with acute on chronic respiratory failure from acute CHF exacerbation. He required IV diuresis and was later switched to oral diuretic. He was also diagnosed with urinary tract infection and started on antibiotic. He has a medical history of urinary retention indwelling chronic foley catheter, CHF, COPD, Seizures,CAD, Dementia among other conditions.He is seen in his room today.He denies any acute issues this visit.He states suprapubic abdominal pain resolved.He has had a remarkable stay  here in rehab.  He has worked well with PT/OT now stable for discharge home.He will be discharged home with Home health PT/OT to continue with ROM, Exercise, Gait stability and muscle strengthening. He will  require DME Rollator with seat and basket to allow him to maintain current level of independence with ADL's. Home health services will be arranged by facility social worker prior to discharge.He will be discharged from the facility with medication from the facility. Prescription medication will be written x 1 month then patient to follow up with PCP in 1-2 weeks.Facility staff report no new concerns.    Past Medical History:  Diagnosis Date  . Acute MI [facility]) Dec 2010   cardiac catheriziation and stenting of the LAD (bare metal) 03/14/09              . Anemia   . Angina   . Blood transfusion   . CAD (coronary artery disease)   . COPD (chronic obstructive pulmonary disease) ([place])   . DEMENTIA   . High cholesterol   . Hypertension   . LV dysfunction    EF 25 to 30% per echo May 2012  . NSTEMI (non-ST elevated myocardial infarction) [facility]) Nov 2011   with LV dysfunction  . Prostatic hypertrophy   . Seizures ([place]) ~ 2006   "when blood pressure went up too high"; on Dilantin  . Urethral stricture    with subsequent trauma and has an indwelling Foley catheter in place during his first MI                                                 .  Urinary catheter in place    chronic  . Vision impairment    "legally blind"    Past Surgical History:  Procedure Laterality Date  . CARDIAC CATHETERIZATION  02/2010  . 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 CHOLECYSTECTOMY  05/30/11  . CHOLECYSTECTOMY  05/30/2011   Procedure: LAPAROSCOPIC CHOLECYSTECTOMY;  Surgeon: Judieth Keens, DO;  Location: [facility];  Service: General;  Laterality: N/A;  . CORONARY ANGIOPLASTY WITH STENT PLACEMENT  03/2009   "1"; stent to LAD  . TONSILLECTOMY     "when I was small"        reports that he quit smoking about 11 years ago. His smoking use included Cigarettes. He smoked 0.00 packs per day for 50.00 years. He has never used smokeless tobacco. He reports that he does not drink alcohol or use drugs. Social History   Social History  . Marital status: Widowed    Spouse name: N/A  . Number of children: 0  . Years of education: N/A   Occupational History  . RETIRED    Social History Main Topics  . Smoking status: Former Smoker    Packs/day: 0.00    Years: 50.00    Types: Cigarettes    Quit date: 08/22/2004  . Smokeless tobacco: Never Used  . Alcohol use No     Comment: "quit drinking 1980's"  . Drug use: No  . Sexual activity: No   Other Topics Concern  . Not on file   Social History Narrative  . No narrative on file    Allergies  Allergen Reactions  . Red Dye Other (See Comments)    Per Mar.  . Iodinated Diagnostic Agents Rash    Pt given 50 mg Benadryl prior to Omnipaque injection, tolerated well.    Pertinent  Health Maintenance Due  Topic Date Due  . PNA vac Low Risk Adult (1 of 2 - PCV13) 03/03/2000  . INFLUENZA VACCINE  11/01/2016    Medications: Allergies as of 08/04/2016      Reactions   Red Dye Other (See Comments)   Per Mar.   Iodinated Diagnostic Agents Rash   Pt given 50 mg Benadryl prior to Omnipaque injection, tolerated well.      Medication List       Accurate as of 08/04/16  6:12 PM. Always use your most recent med list.          acetaminophen 500 MG tablet Commonly known as:  TYLENOL Take 1,000 mg by mouth every 8 (eight) hours as needed for moderate pain.   aspirin 81 MG chewable tablet Chew 81 mg by mouth daily.   atorvastatin 10 MG tablet Commonly known as:  LIPITOR Take 10 mg by mouth daily.   bismuth subsalicylate 409 WJ/19JY suspension Commonly known as:  PEPTO BISMOL Take 30 mLs by mouth as needed for diarrhea or loose stools.   carvedilol 6.25 MG tablet Commonly known as:  COREG Take 6.25  mg by mouth 2 (two) times daily with a meal.   cholecalciferol 1000 units tablet Commonly known as:  VITAMIN D Take 1,000 Units by mouth daily.   DELSYM 30 MG/5ML liquid Generic drug:  dextromethorphan Take 10 mLs by mouth daily as needed for cough.   donepezil 10 MG tablet Commonly known as:  ARICEPT Take 10 mg by mouth at bedtime.   fluticasone furoate-vilanterol 100-25 MCG/INH Aepb Commonly known as:  BREO ELLIPTA Inhale 1 puff into  the lungs daily with breakfast.   furosemide 80 MG tablet Commonly known as:  LASIX Take 1 tablet (80 mg total) by mouth daily.   glycopyrrolate 1 MG tablet Commonly known as:  ROBINUL Take 1 tablet (1 mg total) by mouth 2 (two) times daily.   guaiFENesin 600 MG 12 hr tablet Commonly known as:  MUCINEX Take 1 tablet (600 mg total) by mouth 2 (two) times daily.   loperamide 2 MG capsule Commonly known as:  IMODIUM Take 1 capsule (2 mg total) by mouth 4 (four) times daily as needed for diarrhea or loose stools.   losartan 25 MG tablet Commonly known as:  COZAAR Take 25 mg by mouth daily.   mirabegron ER 50 MG Tb24 tablet Commonly known as:  MYRBETRIQ Take 50 mg by mouth daily with breakfast.   nitroGLYCERIN 0.3 MG SL tablet Commonly known as:  NITROSTAT Place 0.3 mg under the tongue every 5 (five) minutes as needed for chest pain.   ORAJEL MOUTH-AID 20 % Gel Generic drug:  benzocaine Use as directed 1 application in the mouth or throat 4 (four) times daily as needed (gum pain).   phenazopyridine 100 MG tablet Commonly known as:  PYRIDIUM Take 100 mg by mouth 2 (two) times daily. x3 days   phenytoin 100 MG ER capsule Commonly known as:  DILANTIN Take 200 mg by mouth 2 (two) times daily.   PREPARATION H HYDROCORTISONE 1 % Generic drug:  hydrocortisone cream Apply 1 application topically 2 (two) times daily as needed. For hemmoroids.   spironolactone 25 MG tablet Commonly known as:  ALDACTONE Take 25 mg by mouth every  morning.       Review of Systems  Constitutional: Negative for activity change, appetite change, chills, fatigue and fever.  HENT: Negative for congestion, rhinorrhea, sinus pain, sinus pressure, sneezing and sore throat.   Eyes: Negative.   Respiratory: Negative for cough, chest tightness, shortness of breath and wheezing.   Cardiovascular: Negative for chest pain, palpitations and leg swelling.  Gastrointestinal: Negative for abdominal distention, abdominal pain, constipation, diarrhea, nausea and vomiting.  Endocrine: Negative.   Genitourinary: Negative for dysuria, flank pain, frequency and urgency.  Musculoskeletal: Positive for gait problem.  Skin: Negative for color change, pallor and rash.  Neurological: Negative for dizziness, seizures, syncope, light-headedness and headaches.  Hematological: Does not bruise/bleed easily.  Psychiatric/Behavioral: Negative for agitation, confusion, hallucinations and sleep disturbance. The patient is not nervous/anxious.     Vitals:   08/04/16 1042  BP: 128/80  Pulse: 72  Resp: 18  Temp: 98.8 F (37.1 C)  TempSrc: Oral  SpO2: 96%  Weight: 139 lb 2.1 oz (63.1 kg)  Height: 5\' 8"  (1.727 m)   Body mass index is 21.15 kg/m. Physical Exam  Constitutional: He is oriented to person, place, and time. He appears well-developed and well-nourished. No distress.  HENT:  Head: Normocephalic.  Mouth/Throat: Oropharynx is clear and moist. No oropharyngeal exudate.  Eyes: Conjunctivae and EOM are normal. Pupils are equal, round, and reactive to light. Right eye exhibits no discharge. Left eye exhibits no discharge. No scleral icterus.  Neck: Normal range of motion. No JVD present. No thyromegaly present.  Cardiovascular: Normal rate, regular rhythm, normal heart sounds and intact distal pulses.  Exam reveals no gallop and no friction rub.   No murmur heard. Pulmonary/Chest: Effort normal and breath sounds normal. No respiratory distress. He has no  wheezes. He has no rales.  Oxygen 2 Liters [place]   Abdominal: Soft.  Bowel sounds are normal. He exhibits no distension. There is no tenderness. There is no rebound and no guarding.  Genitourinary:  Genitourinary Comments: Foley catheter draining adequate amounts of yellow color urine.   Musculoskeletal: He exhibits no edema, tenderness or deformity.  Unsteady gait.Lower extremities weakness.   Lymphadenopathy:    He has no cervical adenopathy.  Neurological: He is oriented to person, place, and time.  Skin: Skin is warm. No rash noted. No erythema. No pallor.  Psychiatric: He has a normal mood and affect.    Labs reviewed: Basic Metabolic Panel:  Recent Labs  07/18/16 0609 07/19/16 0629 07/20/16 0548 07/27/16  NA 139 139 137 139  K 4.0 4.0 4.2 4.6  CL 109 108 103  --   CO2 22 25 27   --   GLUCOSE 86 85 85  --   BUN 20 17 16  22*  CREATININE 1.14 1.12 1.18 1.2  CALCIUM 8.6* 8.7* 9.0  --    Liver Function Tests:  Recent Labs  02/27/16 2345 06/13/16 0519 07/16/16 0525  AST 22 29 20   ALT 16* 20 18  ALKPHOS 73 85 66  BILITOT 0.6 0.7 0.6  PROT 6.6 7.4 6.5  ALBUMIN 3.7 4.0 3.8    Recent Labs  02/27/16 2345  LIPASE 19   CBC:  Recent Labs  05/17/16 1916 06/13/16 0538  07/16/16 0525  07/18/16 0609 07/19/16 0629 07/20/16 0548 07/27/16  WBC 7.6 12.0*  < > 6.6  < > 6.6 5.9 6.1 6.4  NEUTROABS 4.9 9.5*  --  4.6  --   --   --   --   --   HGB 13.2 12.9*  < > 11.8*  < > 11.9* 11.8* 12.4* 11.7*  HCT 38.9* 39.2  < > 35.6*  < > 35.8* 36.4* 37.6* 36*  MCV 89.6 90.1  < > 91.0  < > 91.6 90.3 92.2  --   PLT 235 248  < > 198  < > 196 190 195 207  < > = values in this interval not displayed. Cardiac Enzymes:  Recent Labs  12/03/15 1421 12/03/15 1749 12/03/15 2026  07/16/16 1239 07/16/16 1843 07/17/16 0353  CKTOTAL 45* 94 43*  --   --   --   --   CKMB 2.0 3.2 1.3  --   --   --   --   TROPONINI 0.04*  --  0.04*  < > 0.03* 0.03* 0.03*  < > = values in this interval not  displayed.  Recent Labs  06/13/16 2026  GLUCAP 100*    Assessment/Plan:   1. Unsteady gait  Has worked well with PT/ OT. Will discharge home PT/OT to continue with ROM, Exercise, Gait stability and muscle strengthening. He will require  DME Rollator with seat and a basket ball to allow him to maintain current level of independence with ADL's. Fall and safety precautions.   2. Chronic combined systolic and diastolic congestive heart failure Stable. No recent abrupt weight gain. Exam findings negative.continue on spironolactone, Losartan, furosemide and coreg. Continue fluid restrictions.check Daily weight.  3. Hypertensive heart disease with combined systolic and diastolic congestive heart failure, unspecified HF chronicity  B/p stable.continue on spironolactone, Losartan, furosemide and coreg.monitor BMP and CBC in 1-2 weeks with PCP.    4. Seizure disorder  No recent seizure episodes. Continue on dilantin. Safety precautions.   5. Dementia without behavioral disturbance No new behavioral issues reported. Continue to assist with ADl's.Safety and fall precautions.   6. Mixed  hyperlipidemia Continue Atorvastatin. Monitor lipid panel periodically.  7. Urinary retention  Has indwelling foley catheter.Continue to follow up with Urologist.   Patient is being discharged with the following home health services:   -PT/OT for ROM, exercise, gait stability and muscle strengthening  Patient is being discharged with the following durable medical equipment:    - Rollator with seat/Basket to allow him to maintain current level of independence.  Patient has been advised to f/u with their PCP in 1-2 weeks to for a transitions of care visit.Social services at their facility was responsible for arranging this appointment.  Pt was provided with adequate prescriptions of noncontrolled medications to reach the scheduled appointment.For controlled substances, a limited supply was provided as  appropriate for the individual patient. If the pt normally receives these medications from a pain clinic or has a contract with another physician, these medications should be received from that clinic or physician only).    Future labs/tests needed:  CBC, BMP in 1-2 weeks PCP

## 2016-09-12 NOTE — Progress Notes (Signed)
Cardiology Office Note   Date:  09/14/2016   ID:  [name], DOB 1932-07-05, MRN [id number]  PCP:  Prince Solian, MD  Cardiologist:   Jenkins Rouge, MD   Chief Complaint  Patient presents with  . Chronic systolic heart failure (HCC)      History of Present Illness: [name] is a 83 y.o. male who presents for fu  of CHF.  Marland Kitchen Anterior MI with stent to mid LAD in 2010. Last myovue 2013 with large anterior MI and EF 24% . Last echo reviewed 05/18/16 EF 15-20% mild MR    He has been blind since 10 has dementia  He lives at [facility] assisted [facility]. Step daugher is his power of  attorney  He played music by ear his whole life and has CD;s out  Hospitalized for CHF 05/19/16  Rx iv lasix and d/c back to SNF.  He is a DNR  His aid was nice enough to bring me a boo "[place] [employer] that included [place]' music and a description  Compliant with meds and doing well since hospital d/c  Past Medical History:  Diagnosis Date  . Acute MI [facility]) Dec 2010   cardiac catheriziation and stenting of the LAD (bare metal) 03/14/09              . Anemia   . Angina   . Blood transfusion   . CAD (coronary artery disease)   . COPD (chronic obstructive pulmonary disease) ([place])   . DEMENTIA   . High cholesterol   . Hypertension   . LV dysfunction    EF 25 to 30% per echo May 2012  . NSTEMI (non-ST elevated myocardial infarction) [facility]) Nov 2011   with LV dysfunction  . Prostatic hypertrophy   . Seizures ([place]) ~ 2006   "when blood pressure went up too high"; on Dilantin  . Urethral stricture    with subsequent trauma and has an indwelling Foley catheter in place during his first MI                                                 . Urinary catheter in place    chronic  . Vision impairment    "legally blind"    Past Surgical History:  Procedure Laterality Date  . CARDIAC CATHETERIZATION  02/2010  . CATARACT EXTRACTION     "both eyes; when I was small; I was born  w/cataracrs"  . CHOLECYSTECTOMY  05/30/11  . CHOLECYSTECTOMY  05/30/2011   Procedure: LAPAROSCOPIC CHOLECYSTECTOMY;  Surgeon: Judieth Keens, DO;  Location: [facility];  Service: General;  Laterality: N/A;  . CORONARY ANGIOPLASTY WITH STENT PLACEMENT  03/2009   "1"; stent to LAD  . TONSILLECTOMY     "when I was small"     Current Outpatient Prescriptions  Medication Sig Dispense Refill  . acetaminophen (TYLENOL) 500 MG tablet Take 1,000 mg by mouth every 8 (eight) hours as needed for moderate pain.    Marland Kitchen aspirin 81 MG chewable tablet Chew 81 mg by mouth daily.    Marland Kitchen atorvastatin (LIPITOR) 10 MG tablet Take 10 mg by mouth daily.    . benzocaine (ORAJEL MOUTH-AID) 20 % GEL Use as directed 1 application in the mouth or throat 4 (four) times daily as needed (gum pain).    Marland Kitchen  bismuth subsalicylate (PEPTO BISMOL) 262 MG/15ML suspension Take 30 mLs by mouth as needed for diarrhea or loose stools.     . carvedilol (COREG) 6.25 MG tablet Take 6.25 mg by mouth 2 (two) times daily with a meal.    . cholecalciferol (VITAMIN D) 1000 UNITS tablet Take 1,000 Units by mouth daily.    Marland Kitchen dextromethorphan (DELSYM) 30 MG/5ML liquid Take 10 mLs by mouth daily as needed for cough.     . donepezil (ARICEPT) 10 MG tablet Take 10 mg by mouth at bedtime.    . Fluticasone Furoate-Vilanterol 100-25 MCG/INH AEPB Inhale 1 puff into the lungs daily with breakfast.    . furosemide (LASIX) 80 MG tablet Take 1 tablet (80 mg total) by mouth daily.    Marland Kitchen glycopyrrolate (ROBINUL) 1 MG tablet Take 1 tablet (1 mg total) by mouth 2 (two) times daily.    Marland Kitchen guaiFENesin (MUCINEX) 600 MG 12 hr tablet Take 1 tablet (600 mg total) by mouth 2 (two) times daily. 20 tablet 0  . hydrocortisone cream (PREPARATION H HYDROCORTISONE) 1 % Apply 1 application topically 2 (two) times daily as needed. For hemmoroids.    Marland Kitchen loperamide (IMODIUM) 2 MG capsule Take 1 capsule (2 mg total) by mouth 4 (four) times daily as needed for diarrhea or loose stools.  20 capsule 0  . losartan (COZAAR) 25 MG tablet Take 25 mg by mouth daily.    . mirabegron ER (MYRBETRIQ) 50 MG TB24 tablet Take 50 mg by mouth daily with breakfast.    . nitroGLYCERIN (NITROSTAT) 0.3 MG SL tablet Place 0.3 mg under the tongue every 5 (five) minutes as needed for chest pain.    . phenazopyridine (PYRIDIUM) 100 MG tablet Take 100 mg by mouth 2 (two) times daily. x3 days    . phenytoin (DILANTIN) 100 MG ER capsule Take 200 mg by mouth 2 (two) times daily.    Marland Kitchen spironolactone (ALDACTONE) 25 MG tablet Take 25 mg by mouth every morning.    . tiotropium (SPIRIVA) 18 MCG inhalation capsule Place 18 mcg into inhaler and inhale daily.     No current facility-administered medications for this visit.     Allergies:   Red dye and Iodinated diagnostic agents    Social History:  The patient  reports that he quit smoking about 12 years ago. His smoking use included Cigarettes. He smoked 0.00 packs per day for 50.00 years. He has never used smokeless tobacco. He reports that he does not drink alcohol or use drugs.   Family History:  The patient's family history is not on file.    ROS:  Please see the history of present illness.   Otherwise, review of systems are positive for none.   All other systems are reviewed and negative.    PHYSICAL EXAM: VS:  BP 96/60   Pulse 76   Ht 5\' 7"  (1.702 m)   Wt 65.2 kg (143 lb 12.8 oz)   SpO2 97%   BMI 22.52 kg/m  , BMI Body mass index is 22.52 kg/m. Affect appropriate Elderly black male  HEENT: blind with poor dentition  Neck supple with no adenopathy JVP normal no bruits no thyromegaly Lungs clear with no wheezing and good diaphragmatic motion Heart:  S1/S2 SEM  murmur, no rub, gallop or click PMI normal Abdomen: benighn, BS positve, no tenderness, no AAA no bruit.  No HSM or HJR Distal pulses intact with no bruits No edema Neuro non-focal Skin warm and dry No muscular weakness  EKG: SR LVH LAD lateral T wave changes    Recent  Labs: 07/16/2016: ALT 18; B Natriuretic Peptide 1,572.0 07/27/2016: BUN 22; Creatinine 1.2; Hemoglobin 11.7; Platelets 207; Potassium 4.6; Sodium 139    Lipid Panel    Component Value Date/Time   CHOL  02/10/2010 0642    139        ATP III CLASSIFICATION:  <200     mg/dL   Desirable  200-239  mg/dL   Borderline High  >=240    mg/dL   High          TRIG 42 02/10/2010 0642   HDL 48 02/10/2010 0642   CHOLHDL 2.9 02/10/2010 0642   VLDL 8 02/10/2010 0642   LDLCALC  02/10/2010 0642    83        Total Cholesterol/HDL:CHD Risk Coronary Heart Disease Risk Table                     Men   Women  1/2 Average Risk   3.4   3.3  Average Risk       5.0   4.4  2 X Average Risk   9.6   7.1  3 X Average Risk  23.4   11.0        Use the calculated Patient Ratio above and the CHD Risk Table to determine the patient's CHD Risk.        ATP III CLASSIFICATION (LDL):  <100     mg/dL   Optimal  100-129  mg/dL   Near or Above                    Optimal  130-159  mg/dL   Borderline  160-189  mg/dL   High  >190     mg/dL   Very High      Wt Readings from Last 3 Encounters:  09/14/16 65.2 kg (143 lb 12.8 oz)  08/04/16 63.1 kg (139 lb 2.1 oz)  08/02/16 63.1 kg (139 lb 2 oz)      Other studies Reviewed: Additional studies/ records that were reviewed today include: Notes 2013 cath 2010/2011 notes primary [facility] Echo September and notes admission [facility] September .    ASSESSMENT AND PLAN:  1.  Chronic Systolic CHF stable on medical rx no aggressive Rx given age and debility  2. CAD stable no chest pain continue medical Rx 3. HTN Well controlled.  Continue current medications and low sodium Dash type diet.   4. COPD improved no active wheezing has had flu and penumonia shot 5. Dementia  Stable quite conversant today Does not do much but sleep and eat at SNF   Current medicines are reviewed at length with the patient today.  The patient does not have concerns regarding  medicines.  The following changes have been made:  no change  Labs/ tests ordered today include: None  No orders of the defined types were placed in this encounter.    Disposition:   FU with [place] in 6 months      Signed, Jenkins Rouge, MD  09/14/2016 11:12 AM    [address], [place], [place]  [address] Phone: [phone]; Fax: [phone]

## 2016-09-14 ENCOUNTER — Encounter: Payer: Self-pay | Admitting: Cardiovascular Disease

## 2016-09-14 ENCOUNTER — Ambulatory Visit (INDEPENDENT_AMBULATORY_CARE_PROVIDER_SITE_OTHER): Payer: Medicare Other | Admitting: Cardiovascular Disease

## 2016-09-14 VITALS — BP 96/60 | HR 76 | Ht 67.0 in | Wt 143.8 lb

## 2016-09-14 DIAGNOSIS — I5022 Chronic systolic (congestive) heart failure: Secondary | ICD-10-CM | POA: Diagnosis not present

## 2016-09-14 NOTE — Patient Instructions (Signed)

## 2016-10-30 ENCOUNTER — Emergency Department (HOSPITAL_COMMUNITY): Payer: Medicare Other

## 2016-10-30 ENCOUNTER — Observation Stay (HOSPITAL_COMMUNITY): Payer: Medicare Other

## 2016-10-30 ENCOUNTER — Encounter (HOSPITAL_COMMUNITY): Payer: Self-pay | Admitting: Emergency Medicine

## 2016-10-30 ENCOUNTER — Observation Stay (HOSPITAL_COMMUNITY)
Admission: EM | Admit: 2016-10-30 | Discharge: 2016-11-01 | Disposition: E | Payer: Medicare Other | Attending: Emergency Medicine | Admitting: Emergency Medicine

## 2016-10-30 DIAGNOSIS — N289 Disorder of kidney and ureter, unspecified: Secondary | ICD-10-CM | POA: Insufficient documentation

## 2016-10-30 DIAGNOSIS — I255 Ischemic cardiomyopathy: Secondary | ICD-10-CM | POA: Diagnosis not present

## 2016-10-30 DIAGNOSIS — J9621 Acute and chronic respiratory failure with hypoxia: Principal | ICD-10-CM | POA: Insufficient documentation

## 2016-10-30 DIAGNOSIS — E7800 Pure hypercholesterolemia, unspecified: Secondary | ICD-10-CM | POA: Diagnosis not present

## 2016-10-30 DIAGNOSIS — I5022 Chronic systolic (congestive) heart failure: Secondary | ICD-10-CM

## 2016-10-30 DIAGNOSIS — I2510 Atherosclerotic heart disease of native coronary artery without angina pectoris: Secondary | ICD-10-CM | POA: Insufficient documentation

## 2016-10-30 DIAGNOSIS — I5042 Chronic combined systolic (congestive) and diastolic (congestive) heart failure: Secondary | ICD-10-CM | POA: Insufficient documentation

## 2016-10-30 DIAGNOSIS — F0390 Unspecified dementia without behavioral disturbance: Secondary | ICD-10-CM | POA: Insufficient documentation

## 2016-10-30 DIAGNOSIS — Z955 Presence of coronary angioplasty implant and graft: Secondary | ICD-10-CM | POA: Diagnosis not present

## 2016-10-30 DIAGNOSIS — Z4659 Encounter for fitting and adjustment of other gastrointestinal appliance and device: Secondary | ICD-10-CM

## 2016-10-30 DIAGNOSIS — Z91041 Radiographic dye allergy status: Secondary | ICD-10-CM | POA: Diagnosis not present

## 2016-10-30 DIAGNOSIS — I25119 Atherosclerotic heart disease of native coronary artery with unspecified angina pectoris: Secondary | ICD-10-CM | POA: Diagnosis not present

## 2016-10-30 DIAGNOSIS — I4901 Ventricular fibrillation: Secondary | ICD-10-CM | POA: Diagnosis not present

## 2016-10-30 DIAGNOSIS — I959 Hypotension, unspecified: Secondary | ICD-10-CM | POA: Diagnosis not present

## 2016-10-30 DIAGNOSIS — Z66 Do not resuscitate: Secondary | ICD-10-CM | POA: Insufficient documentation

## 2016-10-30 DIAGNOSIS — I252 Old myocardial infarction: Secondary | ICD-10-CM | POA: Diagnosis not present

## 2016-10-30 DIAGNOSIS — J449 Chronic obstructive pulmonary disease, unspecified: Secondary | ICD-10-CM | POA: Diagnosis not present

## 2016-10-30 DIAGNOSIS — Z7982 Long term (current) use of aspirin: Secondary | ICD-10-CM | POA: Diagnosis not present

## 2016-10-30 DIAGNOSIS — E876 Hypokalemia: Secondary | ICD-10-CM | POA: Diagnosis not present

## 2016-10-30 DIAGNOSIS — N400 Enlarged prostate without lower urinary tract symptoms: Secondary | ICD-10-CM | POA: Diagnosis not present

## 2016-10-30 DIAGNOSIS — J9601 Acute respiratory failure with hypoxia: Secondary | ICD-10-CM

## 2016-10-30 DIAGNOSIS — R740 Nonspecific elevation of levels of transaminase and lactic acid dehydrogenase [LDH]: Secondary | ICD-10-CM | POA: Diagnosis not present

## 2016-10-30 DIAGNOSIS — G40909 Epilepsy, unspecified, not intractable, without status epilepticus: Secondary | ICD-10-CM | POA: Diagnosis not present

## 2016-10-30 DIAGNOSIS — Z6822 Body mass index (BMI) 22.0-22.9, adult: Secondary | ICD-10-CM | POA: Diagnosis not present

## 2016-10-30 DIAGNOSIS — I110 Hypertensive heart disease with heart failure: Secondary | ICD-10-CM | POA: Insufficient documentation

## 2016-10-30 DIAGNOSIS — Z87891 Personal history of nicotine dependence: Secondary | ICD-10-CM | POA: Diagnosis not present

## 2016-10-30 DIAGNOSIS — E46 Unspecified protein-calorie malnutrition: Secondary | ICD-10-CM | POA: Insufficient documentation

## 2016-10-30 DIAGNOSIS — I469 Cardiac arrest, cause unspecified: Secondary | ICD-10-CM | POA: Diagnosis not present

## 2016-10-30 LAB — CBC WITH DIFFERENTIAL/PLATELET
BASOS PCT: 0 %
Basophils Absolute: 0.1 10*3/uL (ref 0.0–0.1)
EOS ABS: 0.2 10*3/uL (ref 0.0–0.7)
EOS PCT: 2 %
HCT: 34.9 % — ABNORMAL LOW (ref 39.0–52.0)
HEMOGLOBIN: 11.1 g/dL — AB (ref 13.0–17.0)
Lymphocytes Relative: 18 %
Lymphs Abs: 2.5 10*3/uL (ref 0.7–4.0)
MCH: 30.8 pg (ref 26.0–34.0)
MCHC: 31.8 g/dL (ref 30.0–36.0)
MCV: 96.9 fL (ref 78.0–100.0)
MONOS PCT: 5 %
Monocytes Absolute: 0.7 10*3/uL (ref 0.1–1.0)
NEUTROS PCT: 75 %
Neutro Abs: 10.3 10*3/uL — ABNORMAL HIGH (ref 1.7–7.7)
PLATELETS: 185 10*3/uL (ref 150–400)
RBC: 3.6 MIL/uL — AB (ref 4.22–5.81)
RDW: 13 % (ref 11.5–15.5)
WBC: 13.7 10*3/uL — ABNORMAL HIGH (ref 4.0–10.5)

## 2016-10-30 LAB — URINALYSIS, ROUTINE W REFLEX MICROSCOPIC
Bilirubin Urine: NEGATIVE
GLUCOSE, UA: 50 mg/dL — AB
KETONES UR: NEGATIVE mg/dL
NITRITE: NEGATIVE
PH: 6 (ref 5.0–8.0)
Protein, ur: 100 mg/dL — AB
SPECIFIC GRAVITY, URINE: 1.01 (ref 1.005–1.030)
Squamous Epithelial / LPF: NONE SEEN

## 2016-10-30 LAB — I-STAT CG4 LACTIC ACID, ED: Lactic Acid, Venous: 8.56 mmol/L (ref 0.5–1.9)

## 2016-10-30 LAB — I-STAT VENOUS BLOOD GAS, ED
Acid-base deficit: 12 mmol/L — ABNORMAL HIGH (ref 0.0–2.0)
BICARBONATE: 16.5 mmol/L — AB (ref 20.0–28.0)
O2 Saturation: 66 %
PCO2 VEN: 49 mmHg (ref 44.0–60.0)
PH VEN: 7.135 — AB (ref 7.250–7.430)
PO2 VEN: 45 mmHg (ref 32.0–45.0)
TCO2: 18 mmol/L (ref 0–100)

## 2016-10-30 LAB — TRIGLYCERIDES: Triglycerides: 58 mg/dL (ref <150)

## 2016-10-30 LAB — I-STAT ARTERIAL BLOOD GAS, ED
ACID-BASE DEFICIT: 8 mmol/L — AB (ref 0.0–2.0)
BICARBONATE: 19.5 mmol/L — AB (ref 20.0–28.0)
O2 SAT: 98 %
TCO2: 21 mmol/L (ref 0–100)
pCO2 arterial: 43.2 mmHg (ref 32.0–48.0)
pH, Arterial: 7.256 — ABNORMAL LOW (ref 7.350–7.450)
pO2, Arterial: 128 mmHg — ABNORMAL HIGH (ref 83.0–108.0)

## 2016-10-30 LAB — I-STAT TROPONIN, ED: Troponin i, poc: 0.05 ng/mL (ref 0.00–0.08)

## 2016-10-30 LAB — MRSA PCR SCREENING: MRSA by PCR: NEGATIVE

## 2016-10-30 LAB — COMPREHENSIVE METABOLIC PANEL
ALK PHOS: 69 U/L (ref 38–126)
ALT: 54 U/L (ref 17–63)
AST: 78 U/L — ABNORMAL HIGH (ref 15–41)
Albumin: 3.4 g/dL — ABNORMAL LOW (ref 3.5–5.0)
Anion gap: 16 — ABNORMAL HIGH (ref 5–15)
BUN: 17 mg/dL (ref 6–20)
CALCIUM: 8.2 mg/dL — AB (ref 8.9–10.3)
CO2: 15 mmol/L — ABNORMAL LOW (ref 22–32)
CREATININE: 1.42 mg/dL — AB (ref 0.61–1.24)
Chloride: 109 mmol/L (ref 101–111)
GFR, EST AFRICAN AMERICAN: 52 mL/min — AB (ref >60)
GFR, EST NON AFRICAN AMERICAN: 45 mL/min — AB (ref >60)
Glucose, Bld: 288 mg/dL — ABNORMAL HIGH (ref 65–99)
Potassium: 2.5 mmol/L — CL (ref 3.5–5.1)
Sodium: 140 mmol/L (ref 135–145)
Total Bilirubin: 0.6 mg/dL (ref 0.3–1.2)
Total Protein: 6 g/dL — ABNORMAL LOW (ref 6.5–8.1)

## 2016-10-30 LAB — PHENYTOIN LEVEL, TOTAL: PHENYTOIN LVL: 5.4 ug/mL — AB (ref 10.0–20.0)

## 2016-10-30 LAB — CBG MONITORING, ED: Glucose-Capillary: 150 mg/dL — ABNORMAL HIGH (ref 65–99)

## 2016-10-30 MED ORDER — FENTANYL CITRATE (PF) 100 MCG/2ML IJ SOLN
50.0000 ug | Freq: Once | INTRAMUSCULAR | Status: AC
  Administered 2016-10-30: 50 ug via INTRAVENOUS

## 2016-10-30 MED ORDER — MIDAZOLAM HCL 2 MG/2ML IJ SOLN
INTRAMUSCULAR | Status: AC
  Administered 2016-10-30: 2 mg via INTRAVENOUS
  Filled 2016-10-30: qty 4

## 2016-10-30 MED ORDER — FENTANYL CITRATE (PF) 100 MCG/2ML IJ SOLN: 50.0000 ug | INTRAMUSCULAR | Status: DC | PRN

## 2016-10-30 MED ORDER — VANCOMYCIN HCL IN DEXTROSE 1-5 GM/200ML-% IV SOLN: 1000.0000 mg | INTRAVENOUS | Status: DC

## 2016-10-30 MED ORDER — SODIUM CHLORIDE 0.9 % IV BOLUS (SEPSIS)
1000.0000 mL | Freq: Once | INTRAVENOUS | Status: AC
  Administered 2016-10-30: 1000 mL via INTRAVENOUS

## 2016-10-30 MED ORDER — FENTANYL 2500MCG IN NS 250ML (10MCG/ML) PREMIX INFUSION: 10.0000 ug/h | INTRAVENOUS | Status: DC

## 2016-10-30 MED ORDER — FENTANYL CITRATE (PF) 100 MCG/2ML IJ SOLN
50.0000 ug | INTRAMUSCULAR | Status: DC | PRN
  Filled 2016-10-30: qty 2

## 2016-10-30 MED ORDER — FENTANYL CITRATE (PF) 100 MCG/2ML IJ SOLN
INTRAMUSCULAR | Status: AC
  Administered 2016-10-30: 50 ug via INTRAVENOUS
  Filled 2016-10-30: qty 2

## 2016-10-30 MED ORDER — MIDAZOLAM HCL 2 MG/2ML IJ SOLN
1.0000 mg | INTRAMUSCULAR | Status: DC | PRN
  Administered 2016-10-30: 2 mg via INTRAVENOUS

## 2016-10-30 MED ORDER — ACETAMINOPHEN 325 MG PO TABS: 650.0000 mg | ORAL_TABLET | ORAL | Status: DC | PRN

## 2016-10-30 MED ORDER — PROPOFOL 1000 MG/100ML IV EMUL
INTRAVENOUS | Status: DC
Start: 2016-10-30 — End: 2016-10-30
  Filled 2016-10-30: qty 100

## 2016-10-30 MED ORDER — EPINEPHRINE PF 1 MG/ML IJ SOLN: 1.0000 mg | Freq: Once | INTRAMUSCULAR | Status: DC

## 2016-10-30 MED ORDER — FENTANYL 2500MCG IN NS 250ML (10MCG/ML) PREMIX INFUSION
25.0000 ug/h | INTRAVENOUS | Status: DC
  Administered 2016-10-30: 75 ug/h via INTRAVENOUS
  Filled 2016-10-30: qty 250

## 2016-10-30 MED ORDER — SODIUM CHLORIDE 0.9 % IV SOLN
INTRAVENOUS | Status: DC
  Administered 2016-10-30: 1000 mL/h via INTRAVENOUS

## 2016-10-30 MED ORDER — FENTANYL 2500MCG IN NS 250ML (10MCG/ML) PREMIX INFUSION: 25.0000 ug/h | INTRAVENOUS | Status: DC

## 2016-10-30 MED ORDER — NOREPINEPHRINE BITARTRATE 1 MG/ML IV SOLN
5.0000 ug/min | INTRAVENOUS | Status: DC
  Administered 2016-10-30: 15 ug/min via INTRAVENOUS
  Filled 2016-10-30: qty 4

## 2016-10-30 MED ORDER — DEXTROSE 5 % IV SOLN
2.0000 g | Freq: Once | INTRAVENOUS | Status: AC
  Administered 2016-10-30: 2 g via INTRAVENOUS
  Filled 2016-10-30: qty 2

## 2016-10-30 MED ORDER — FENTANYL BOLUS VIA INFUSION
25.0000 ug | INTRAVENOUS | Status: DC | PRN
  Filled 2016-10-30: qty 25

## 2016-10-30 MED ORDER — VANCOMYCIN HCL IN DEXTROSE 1-5 GM/200ML-% IV SOLN
1000.0000 mg | Freq: Once | INTRAVENOUS | Status: AC
  Administered 2016-10-30: 1000 mg via INTRAVENOUS
  Filled 2016-10-30: qty 200

## 2016-10-30 MED ORDER — POTASSIUM CHLORIDE 20 MEQ/15ML (10%) PO SOLN: 40.0000 meq | Freq: Once | ORAL | Status: DC

## 2016-10-30 MED ORDER — FENTANYL BOLUS VIA INFUSION
50.0000 ug | INTRAVENOUS | Status: DC | PRN
  Administered 2016-10-30 (×4): 200 ug via INTRAVENOUS
  Filled 2016-10-30: qty 200

## 2016-10-30 MED ORDER — PROPOFOL 1000 MG/100ML IV EMUL
0.0000 ug/kg/min | INTRAVENOUS | Status: DC
  Administered 2016-10-30: 10 ug/kg/min via INTRAVENOUS

## 2016-10-30 MED ORDER — ASPIRIN 300 MG RE SUPP: 300.0000 mg | RECTAL | Status: DC

## 2016-10-30 MED ORDER — DEXTROSE 5 % IV SOLN: 1.0000 g | INTRAVENOUS | Status: DC

## 2016-10-30 MED ORDER — FENTANYL CITRATE (PF) 100 MCG/2ML IJ SOLN
50.0000 ug | INTRAMUSCULAR | Status: DC | PRN
  Administered 2016-10-30: 50 ug via INTRAVENOUS

## 2016-10-30 MED ORDER — MIDAZOLAM HCL 2 MG/2ML IJ SOLN
1.0000 mg | INTRAMUSCULAR | Status: DC | PRN
  Administered 2016-10-30: 1 mg via INTRAVENOUS
  Filled 2016-10-30: qty 2

## 2016-10-30 MED ORDER — SODIUM BICARBONATE 8.4 % IV SOLN
50.0000 meq | Freq: Once | INTRAVENOUS | Status: AC
  Administered 2016-10-30: 50 meq via INTRAVENOUS
  Filled 2016-10-30: qty 50

## 2016-11-01 ENCOUNTER — Telehealth: Payer: Self-pay

## 2016-11-01 NOTE — Progress Notes (Signed)
Pharmacy Antibiotic Note  [name] is a 83 y.o. male admitted on 11/07/2016 with sepsis.  Presented to the ED post-choking episode and cardiac arrest. Concerns for aspiration PNA. Pharmacy has been consulted for vancomycin and cefepime dosing. WBC 13.7, Lactic acid 8.56.   Plan: Vancomycin 1,000 mg x1 Cefepime 2g x1 Follow-up cultures and clinical progress Will follow-up Scr for maintenance doses  Height: 5\' 7"  (170.2 cm) Weight: 143 lb (64.9 kg) IBW/kg (Calculated) : 66.1  No data recorded.   Recent Labs Lab 11/07/2016 1036  LATICACIDVEN 8.56*    CrCl cannot be calculated (Patient's most recent lab result is older than the maximum 21 days allowed.).    Allergies  Allergen Reactions  . Red Dye Other (See Comments)    Per Mar.  . Iodinated Diagnostic Agents Rash    Pt given 50 mg Benadryl prior to Omnipaque injection, tolerated well.    Antimicrobials this admission: Vancomycin 7/30 >>  Cefepime 7/30 >>   Microbiology results: 7/30 BCx:    Thank you for allowing pharmacy to be a part of this patient's care.  Bridgett Larsson, PharmD, [place] PGY1 Pharmacy Resident Nov 07, 2016 10:51 AM

## 2016-11-01 NOTE — ED Notes (Signed)
Packed with ICE

## 2016-11-01 NOTE — ED Notes (Signed)
Not a code cool will remove ice packs.

## 2016-11-01 NOTE — Procedures (Signed)
Extubation Procedure Note  Patient Details:   Name: [name] DOB: 1932/08/25 MRN: [id number]   Airway Documentation:  Airway 8 mm (Active)  Secured at (cm) 25 cm 11/19/16  7:22 PM  Measured From Lips 11/19/2016  7:22 PM  Secured Location Right 2016-11-19  7:22 PM  Secured By [employer] 11/19/16  7:22 PM  Tube Holder Repositioned Yes 11-19-16  7:22 PM  Cuff Pressure (cm H2O) 28 cm H2O 19-Nov-2016  4:48 PM  Site Condition Dry 2016/11/19  7:22 PM    Evaluation  O2 sats: currently acceptable Complications: No apparent complications Patient did tolerate procedure well. Bilateral Breath Sounds: Clear, Diminished   No  Pt terminally extubated per MD order.  Dariyah Garduno, Kerri Perches F 11/19/2016, 9:09 PM

## 2016-11-01 NOTE — ED Notes (Signed)
Decrease pressors until can be discontinued per CCm.

## 2016-11-01 NOTE — Consult Note (Addendum)
CARDIOLOGY CONSULT NOTE      Patient ID: [name] MRN: [id number] DOB/AGE: May 04, 1932 83 y.o.  Admit date: Nov 10, 2016 Referring PhysicianYao, Lujean Rave, MD Primary Toy Baker, MD Primary Cardiologist Br[place] Reason for Consultation   HPI: 83 year old man who lives at an assisted living facility. He has a history of anterior MI and LAD stent. He has a known ejection fraction which is severely decreased. He also has dementia.  He apparently choked on some eggs this morning. He received the Heimlich maneuver. He lost his pulse and was unable to breathe. EMS was called. Apparently, he had ventricular fibrillation. He was shocked once. He received about 20 minutes of CPR per the history we have. Sinus rhythm was restored.  Review of systems complete and found to be negative unless listed above   Past Medical History:  Diagnosis Date  . Acute MI [facility]) Dec 2010   cardiac catheriziation and stenting of the LAD (bare metal) 03/14/09              . Anemia   . Angina   . Blood transfusion   . CAD (coronary artery disease)   . COPD (chronic obstructive pulmonary disease) ([place])   . DEMENTIA   . High cholesterol   . Hypertension   . LV dysfunction    EF 25 to 30% per echo May 2012  . NSTEMI (non-ST elevated myocardial infarction) [facility]) Nov 2011   with LV dysfunction  . Prostatic hypertrophy   . Seizures ([place]) ~ 2006   "when blood pressure went up too high"; on Dilantin  . Urethral stricture    with subsequent trauma and has an indwelling Foley catheter in place during his first MI                                                 . Urinary catheter in place    chronic  . Vision impairment    "legally blind"    Family History  Problem Relation Age of Onset  . Colon cancer Neg Hx     Social History   Social History  . Marital status: Widowed    Spouse name: N/A  . Number of children: 0  . Years of education: N/A   Occupational History  . RETIRED     Social History Main Topics  . Smoking status: Former Smoker    Packs/day: 0.00    Years: 50.00    Types: Cigarettes    Quit date: 08/22/2004  . Smokeless tobacco: Never Used  . Alcohol use No     Comment: "quit drinking 1980's"  . Drug use: No  . Sexual activity: No   Other Topics Concern  . Not on file   Social History Narrative  . No narrative on file    Past Surgical History:  Procedure Laterality Date  . CARDIAC CATHETERIZATION  02/2010  . 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 CHOLECYSTECTOMY  05/30/11  . CHOLECYSTECTOMY  05/30/2011   Procedure: LAPAROSCOPIC CHOLECYSTECTOMY;  Surgeon: Judieth Keens, DO;  Location: [facility];  Service: General;  Laterality: N/A;  . CORONARY ANGIOPLASTY WITH STENT PLACEMENT  03/2009   "1"; stent to LAD  . TONSILLECTOMY     "when I was small"      (Not in a  hospital admission)  Physical Exam: Vitals:   Vitals:   11-29-2016 1018 2016/11/29 1024 11/29/16 1030 11-29-2016 1035  BP:   (!) 70/58 (!) 88/68  Pulse:    69  Resp:   17 14  SpO2:  91%  100%  Weight: 143 lb (64.9 kg)     Height: 5\' 7"  (1.702 m)      I&O's:  No intake or output data in the 24 hours ending November 29, 2016 1045 Physical exam: Intubated, unresponsive [place]/AT No JVD, No carotid bruit RRR S1S2  No wheezing, coarse breath sounds bilaterally Soft.  nondistended No edema. Unable to assess neuro, extraocular movements, psych due to beam be intubated Normal affect  Labs:   Lab Results  Component Value Date   WBC 6.4 07/27/2016   HGB 11.7 (A) 07/27/2016   HCT 36 (A) 07/27/2016   MCV 92.2 07/20/2016   PLT 207 07/27/2016   No results for input(s): NA, K, CL, CO2, BUN, CREATININE, CALCIUM, PROT, BILITOT, ALKPHOS, ALT, AST, GLUCOSE in the last 168 hours.  Invalid input(s): LABALBU Lab Results  Component Value Date   CKTOTAL 43 (L) 12/03/2015   CKMB 1.3 12/03/2015   TROPONINI 0.03 (HH) 07/17/2016    Lab Results  Component  Value Date   CHOL  02/10/2010    139        ATP III CLASSIFICATION:  <200     mg/dL   Desirable  200-239  mg/dL   Borderline High  >=240    mg/dL   High          CHOL (H) 03/15/2009    207        ATP III CLASSIFICATION:  <200     mg/dL   Desirable  200-239  mg/dL   Borderline High  >=240    mg/dL   High          Lab Results  Component Value Date   HDL 48 02/10/2010   HDL 35 (L) 03/15/2009   Lab Results  Component Value Date   LDLCALC  02/10/2010    83        Total Cholesterol/HDL:CHD Risk Coronary Heart Disease Risk Table                     Men   Women  1/2 Average Risk   3.4   3.3  Average Risk       5.0   4.4  2 X Average Risk   9.6   7.1  3 X Average Risk  23.4   11.0        Use the calculated Patient Ratio above and the CHD Risk Table to determine the patient's CHD Risk.        ATP III CLASSIFICATION (LDL):  <100     mg/dL   Optimal  100-129  mg/dL   Near or Above                    Optimal  130-159  mg/dL   Borderline  160-189  mg/dL   High  >190     mg/dL   Very High   LDLCALC (H) 03/15/2009    156        Total Cholesterol/HDL:CHD Risk Coronary Heart Disease Risk Table                     Men   Women  1/2 Average Risk   3.4   3.3  Average Risk  5.0   4.4  2 X Average Risk   9.6   7.1  3 X Average Risk  23.4   11.0        Use the calculated Patient Ratio above and the CHD Risk Table to determine the patient's CHD Risk.        ATP III CLASSIFICATION (LDL):  <100     mg/dL   Optimal  100-129  mg/dL   Near or Above                    Optimal  130-159  mg/dL   Borderline  160-189  mg/dL   High  >190     mg/dL   Very High   Lab Results  Component Value Date   TRIG 42 02/10/2010   TRIG 80 03/15/2009   Lab Results  Component Value Date   CHOLHDL 2.9 02/10/2010   CHOLHDL 5.9 03/15/2009   No results found for: LDLDIRECT     EKG: Normal sinus rhythm, PVCs, right bundle branch block, nonspecific ST segment changes  ASSESSMENT AND PLAN:    Active Problems:   * No active hospital problems. *  Cardiac arrest/respiratory arrest after choking Coronary artery disease  1) This is not a STEMI, and does not appear to be a primary cardiac event. Looking through the chart, Dr. Johnsie Cancel mentions that he is a DO NOT RESUSCITATE in his last note.  Would not pursue any invasive cardiac testing at this time. Would recommend consult and critical care regarding decisions for either escalation or de-escalation of current support. His baseline status includes blindness and dementia based on the chart.  2) Old MI, Chronic systolic CHF, CAD: Medical therapy only.  Episode today does not appear to be related to CHF.   Signed:   Mina Marble, MD, [facility] 11-15-16, 10:45 AM

## 2016-11-01 NOTE — ED Notes (Signed)
Family at bedside with [facility] critical care NP

## 2016-11-01 NOTE — Progress Notes (Signed)
   11-19-2016 1000  Clinical Encounter Type  Visited With Patient;Patient and family together;Health care provider  Visit Type Code  Referral From Nurse;Physician  Consult/Referral To Chaplain  Spiritual Encounters  Spiritual Needs Prayer;Emotional  Stress Factors  Patient Stress Factors None identified  Family Stress Factors Family relationships;Health changes;Lack of knowledge    Chaplain responded to page in ED. Spoke with nurse and dr. Patient is post cardiac arrest. Step daughter is in consult room A awaiting update from dr. Only other family is sibling in [place]. Prayed with family, provided emotional support and ministry of presence. Javonte Elenes L. Volanda Napoleon, MDiv

## 2016-11-01 NOTE — ED Notes (Addendum)
Per xray report OG tube was advanced 10cm and will verify placement prior to use. Unable to administer Potassium at this time per tube.

## 2016-11-01 NOTE — ED Notes (Signed)
Room still unavailable unable to transport pt upstairs.

## 2016-11-01 NOTE — H&P (Signed)
Name: [name] MRN: [id number] DOB: 01/02/1933    ADMISSION DATE:  24-Nov-2016 CONSULTATION DATE:  7/31  REFERRING MD :  Dr. Darl Householder  CHIEF COMPLAINT:  Cardiac arrest  HISTORY OF PRESENT ILLNESS:  83 year old male with mulitple medical problems as outlined below, significant for CAD, cardiomyopathy with EF 15%, and dementia. He resides in skilled nursing facility where he requires assistance with his ADLs. Stepdaughter (and HCPOA) is present and giving history. The AM of 7/30 he was at [facility] when it is suspected that he aspirated on his breakfast. He was given the heimlich maneuver and subsequently suffered a cardiac arrest. CPR was initiated and continued for about 20 mins prior to EMS arrival when pads were placed and he was found to be in VF. He was successfully defibrillated. Total downtime estimated at 25 mins. He was intubated in field. Hypotensive in ED requiring pressors. PCCM asked to admit.     SIGNIFICANT EVENTS    STUDIES:    PAST MEDICAL HISTORY :   has a past medical history of Acute MI [facility]) (Dec 2010); Anemia; Angina; Blood transfusion; CAD (coronary artery disease); COPD (chronic obstructive pulmonary disease) ([place]); DEMENTIA; High cholesterol; Hypertension; LV dysfunction; NSTEMI (non-ST elevated myocardial infarction) [facility]) (Nov 2011); Prostatic hypertrophy; Seizures ([place]) (~ 2006); Urethral stricture; Urinary catheter in place; and Vision impairment.  has a past surgical history that includes Tonsillectomy; Cataract extraction; Cardiac catheterization (02/2010); Coronary angioplasty with stent (03/2009); Cholecystectomy (05/30/11); and Cholecystectomy (05/30/2011). Prior to Admission medications   Medication Sig Start Date End Date Taking? Authorizing Provider  acetaminophen (TYLENOL) 500 MG tablet Take 1,000 mg by mouth every 8 (eight) hours as needed for moderate pain.    [provider]  aspirin 81 MG chewable tablet Chew 81 mg by mouth daily.    [provider]  atorvastatin (LIPITOR) 10 MG tablet Take 10 mg by mouth daily.    [provider]  benzocaine (ORAJEL MOUTH-AID) 20 % GEL Use as directed 1 application in the mouth or throat 4 (four) times daily as needed (gum pain).    [provider]  bismuth subsalicylate (PEPTO BISMOL) 262 MG/15ML suspension Take 30 mLs by mouth as needed for diarrhea or loose stools.     [provider]  carvedilol (COREG) 6.25 MG tablet Take 6.25 mg by mouth 2 (two) times daily with a meal.    [provider]  cholecalciferol (VITAMIN D) 1000 UNITS tablet Take 1,000 Units by mouth daily.    [provider]  dextromethorphan (DELSYM) 30 MG/5ML liquid Take 10 mLs by mouth daily as needed for cough.     [provider]  donepezil (ARICEPT) 10 MG tablet Take 10 mg by mouth at bedtime.    [provider]  Fluticasone Furoate-Vilanterol 100-25 MCG/INH AEPB Inhale 1 puff into the lungs daily with breakfast.    [provider]  furosemide (LASIX) 80 MG tablet Take 1 tablet (80 mg total) by mouth daily. 07/20/16   Patrecia Pour, MD  glycopyrrolate (ROBINUL) 1 MG tablet Take 1 tablet (1 mg total) by mouth 2 (two) times daily. 07/20/16   Patrecia Pour, MD  guaiFENesin (MUCINEX) 600 MG 12 hr tablet Take 1 tablet (600 mg total) by mouth 2 (two) times daily. 06/16/16   Elgergawy, Silver Huguenin, MD  hydrocortisone cream (PREPARATION H HYDROCORTISONE) 1 % Apply 1 application topically 2 (two) times daily as needed. For hemmoroids.    [provider]  loperamide (  IMODIUM) 2 MG capsule Take 1 capsule (2 mg total) by mouth 4 (four) times daily as needed for diarrhea or loose stools. 02/28/16   Orpah Greek, MD  losartan (COZAAR) 25 MG tablet Take 25 mg by mouth daily.    [provider]  mirabegron ER (MYRBETRIQ) 50 MG TB24 tablet Take 50 mg by mouth daily with breakfast.    [provider]  nitroGLYCERIN (NITROSTAT) 0.3 MG SL  tablet Place 0.3 mg under the tongue every 5 (five) minutes as needed for chest pain.    [provider]  phenazopyridine (PYRIDIUM) 100 MG tablet Take 100 mg by mouth 2 (two) times daily. x3 days    [provider]  phenytoin (DILANTIN) 100 MG ER capsule Take 200 mg by mouth 2 (two) times daily.    [provider]  spironolactone (ALDACTONE) 25 MG tablet Take 25 mg by mouth every morning.    [provider]  tiotropium (SPIRIVA) 18 MCG inhalation capsule Place 18 mcg into inhaler and inhale daily.    [provider]   Allergies  Allergen Reactions  . Red Dye Other (See Comments)    Per Mar.  . Iodinated Diagnostic Agents Rash    Pt given 50 mg Benadryl prior to Omnipaque injection, tolerated well.    FAMILY HISTORY:  family history is not on file. SOCIAL HISTORY:  reports that he quit smoking about 12 years ago. His smoking use included Cigarettes. He smoked 0.00 packs per day for 50.00 years. He has never used smokeless tobacco. He reports that he does not drink alcohol or use drugs.  REVIEW OF SYSTEMS:  Unable as patient is encephalopathic and intubated.  SUBJECTIVE:   VITAL SIGNS: Temp:  [96.8 F (36 C)] 96.8 F (36 C) (07/30 1125) Pulse Rate:  [25-143] 95 (07/30 1155) Resp:  [14-42] 29 (07/30 1155) BP: (61-181)/(46-110) 174/109 (07/30 1155) SpO2:  [84 %-100 %] 100 % (07/30 1155) FiO2 (%):  [100 %] 100 % (07/30 1000) Weight:  [64.9 kg (143 lb)-65.1 kg (143 lb 8.3 oz)] 64.9 kg (143 lb) (07/30 1018)  PHYSICAL EXAMINATION: General:  Frail appearing elderly male on vent Neuro:  GCS3, no response to pain. Intermittent movements resembling decorticate posturing.  HEENT:  [place]/AT, R pupil irregular, seems chronic. Left reactive to light.  Cardiovascular:  RRR, no MRG Lungs:  Coarse bilateral Abdomen:  Soft, non-distended Musculoskeletal:  No acute deformity Skin:  Grossly intact   Recent Labs Lab 11/05/16 1009  NA 140  K 2.5*    CL 109  CO2 15*  BUN 17  CREATININE 1.42*  GLUCOSE 288*    Recent Labs Lab 11/05/2016 1009  HGB 11.1*  HCT 34.9*  WBC 13.7*  PLT 185   Dg Chest Port 1 View  Result Date: November 05, 2016 CLINICAL DATA:  Hypoxia EXAM: PORTABLE CHEST 1 VIEW COMPARISON:  July 20, 2016 FINDINGS: Endotracheal tube tip is 7.3 cm above the carina. Orogastric tube extends in the stomach or the tube loops on itself. The tip of the orogastric tube is in the distal esophagus. No pneumothorax. There is mild bibasilar atelectasis. There is also mild atelectasis in the right mid lung. No edema or consolidation. Heart is upper normal in size with pulmonary vascular within normal limits. There is aortic atherosclerosis. There are several displaced rib fractures on the right. IMPRESSION: Tube positions as described without pneumothorax. Note that the tip of the orogastric tube is in the distal esophagus after the tube looped on itself in  the proximal stomach. Areas of patchy atelectasis bilaterally. Displaced rib fractures on the right. Stable cardiac silhouette. There is aortic atherosclerosis. Aortic Atherosclerosis (ICD10-I70.0). Electronically Signed   By: Lowella Grip III M.D.   On: Nov 05, 2016 10:32   Dg Abd Portable 1 View  Result Date: 11/05/2016 CLINICAL DATA:  Orogastric tube placement EXAM: PORTABLE ABDOMEN - 1 VIEW COMPARISON:  None. FINDINGS: Orogastric tube extends in the stomach. The tube loops on itself with the tip in the distal esophagus. The side port is at the gastroesophageal junction after the loop. There is gastric dilatation with air. There is no bowel obstruction or free air evident. IMPRESSION: Orogastric tube loops on itself in the stomach with the tip in the distal esophagus. Advise advancing orogastric tube approximately 10 cm to hopefully advance the tube tip into the stomach. Advise obtaining abdominal image after tube advancement. Gastric dilatation noted. No overt bowel obstruction or free air  evident. Electronically Signed   By: Lowella Grip III M.D.   On: 05-Nov-2016 10:33    ASSESSMENT / PLAN:  Cardiac arrest - Ventricular fibrillation of 25 mins downtime in a patient with known LVEF 15% and dementia. Step-daughter (also HCPOA) is here in ED and has his DNR paperwork. She is very upset that he has been resuscitated. Considering his chronic illness and the events of today she understand that his prognosis for a meaningful recovery (by her and the patient's standards) is rather poor. She would like to enact the DNR at this time with the goal of comfort care once family has been able to see him later today.   Plan: Admit ICU with tele montoring Continue full vent support for now No hypothermia, avoid fevers medically Continue levophed PRN with MAP goal 38mmHg for now Will start fentanyl infusion for comfort Supp K  Patient's niece and friend may want to come visit him. We will await this before proceeding with comfort care. Will support him and manage discomfort in the mean time.    Georgann Housekeeper, AGACNP-BC [facility] [facility] Pager [phone] or [phone]  November 05, 2016 12:21 PM   Attending Note:  I have examined patient, reviewed labs, studies and notes. I have discussed the case with Jaclynn Guarneri, and I agree with the data and plans as amended above.   83 year old man with a history of coronary disease, ischemic artery myopathy, dementia. He resides in skilled nursing facility where he was witnessed to suffer an aspiration event on the morning of 7/30.Heimlich maneuver was attempted, only partially successful. He decompensated and suffered cardiac arrest. CPR was immediately initiated. On EMS arrival he was found to be in VF and was successfully defibrillated. Downtime was approximately 20 minutes. On my evaluation in the emergency department he is unresponsive, mechanically ventilated, on no sedation. He is tachypneic and tachycardic. Coarse bilateral breath  sounds. ET tube appears to be in good position. Heart tachycardic and regular, no murmurs heard. Abdomen is somewhat firm, hypoactive bowel sounds. Bilateral ankle edema noted. Cold saline was initiated in consideration for possible hypothermia protocol. Discussion was undertaken with the patient's stepdaughter in the emergency department. She clarified that Mr. Theil overall status has been declining. His quality of life is poor. He had DO NOT RESUSCITATE orders in place but these were apparently not available at the time of his acute decompensation. His stepdaughter clarified that the patient would not want further aggressive care. After discussing the options it was felt that the plan most consistent with the patient's wishes would be  to transition to comfort care. We will keep patient stable on mechanical ventilation, initiate some sedation for his comfort, await his niece and friend who hoped to come to visit him. Once they have been able to visit we will straight a withdrawal of care and transition to full comfort.  Independent critical care time is 60 minutes.   Baltazar Apo, MD, PhD 10/31/2016, 2:23 PM [facility] [phone] or if no answer [phone]

## 2016-11-01 NOTE — Progress Notes (Signed)
Patient cardiac arrest at 2201.  No heart sounds auscultated by Rito Ehrlich, RN and Vic Blackbird, RN.  Family at bedside and notified at time of death.  85cc of Fentanyl wasted in sink and witnessed by Daleen Bo, RN.

## 2016-11-01 NOTE — Progress Notes (Signed)
PCCM INTERVAL PROGRESS NOTE  Family has gathered and are ready for transition to comfort care. Will prepare for one way extubation, but not extubate until family gets back from dinner as are their wishes.    Georgann Housekeeper, AGACNP-BC [facility]/Critical Care Pager [phone] or [phone]  November 28, 2016 6:09 PM

## 2016-11-01 NOTE — ED Notes (Signed)
Family at bedside. 

## 2016-11-01 NOTE — ED Notes (Signed)
CCm at bedside. Made aware family in consultation room.

## 2016-11-01 NOTE — Telephone Encounter (Signed)
On 11/01/16 I received a death certificate from [place] (original). The death certificate is for burial. The patient is a patient of Doctor Byrum. The death certificate will be taken to [facility] this pm for signature.  On 06-Nov-2016 I received the death certificate back from Doctor Byrum. I got the death certificate ready and called the funeral home to let them know the death certificate is ready for pickup.

## 2016-11-01 NOTE — ED Provider Notes (Signed)
[facility] DEPT Provider Note   CSN: [id number] Arrival date & time: 11/20/2016  6568     History   Chief Complaint Chief Complaint  Patient presents with  . Cardiac Arrest    HPI [name] is a 83 y.o. male history of CAD, dementia, CHF with EF of 25%, DNR here with cardiac arrest, food aspiration. Patient was from an assisted living. He ate some breakfast this morning and then aspirated. The staff performed rescue efforts to remove foreign body and then he became pulseless. CPR started right away. EMS noted Vfib so he was shocked once. Epi x 3 given and achieved ROSC in 20 min. Patient was intubated in the field and there was no DNR paperwork at assisted living.   The history is provided by the EMS personnel and a relative.   Level V caveat- AMS, intubated   Past Medical History:  Diagnosis Date  . Acute MI [facility]) Dec 2010   cardiac catheriziation and stenting of the LAD (bare metal) 03/14/09              . Anemia   . Angina   . Blood transfusion   . CAD (coronary artery disease)   . COPD (chronic obstructive pulmonary disease) ([place])   . DEMENTIA   . High cholesterol   . Hypertension   . LV dysfunction    EF 25 to 30% per echo May 2012  . NSTEMI (non-ST elevated myocardial infarction) [facility]) Nov 2011   with LV dysfunction  . Prostatic hypertrophy   . Seizures ([place]) ~ 2006   "when blood pressure went up too high"; on Dilantin  . Urethral stricture    with subsequent trauma and has an indwelling Foley catheter in place during his first MI                                                 . Urinary catheter in place    chronic  . Vision impairment    "legally blind"    Patient Active Problem List   Diagnosis Date Noted  . Oropharyngeal dysphagia 07/24/2016  . Heart failure ([place]) 07/16/2016  . Acute on chronic respiratory failure with hypoxemia ([place]) 07/16/2016  . Chronic CHF ([place]) 06/13/2016  . HCAP (healthcare-associated pneumonia) 06/13/2016  . Bilateral  leg edema   . Hypervolemia   . Dementia without behavioral disturbance 05/18/2016  . Hypoxia 12/03/2015  . Acute on chronic combined systolic and diastolic CHF (congestive heart failure) ([place]) 12/03/2015  . Rectal bleeding 07/01/2015  . UTI (lower urinary tract infection) 03/07/2012  . Lower GI bleed = suspect diverticular 03/06/2012  . Chest pain, unspecified 05/11/2011  . Preop cardiovascular exam 12/07/2010  . CAD (coronary artery disease) 08/24/2010  . Hypertensive heart disease with CHF (congestive heart failure) ([place])   . Seizure disorder ([place])   . Anemia, chronic disease   . LV dysfunction   . NSTEMI (non-ST elevated myocardial infarction) [facility])     Past Surgical History:  Procedure Laterality Date  . CARDIAC CATHETERIZATION  02/2010  . 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 CHOLECYSTECTOMY  05/30/11  . CHOLECYSTECTOMY  05/30/2011   Procedure: LAPAROSCOPIC CHOLECYSTECTOMY;  Surgeon: Judieth Keens, DO;  Location: [place];  Service: General;  Laterality: N/A;  . CORONARY ANGIOPLASTY  WITH STENT PLACEMENT  03/2009   "1"; stent to LAD  . TONSILLECTOMY     "when I was small"       Home Medications    Prior to Admission medications   Medication Sig Start Date End Date Taking? Authorizing Provider  acetaminophen (TYLENOL) 500 MG tablet Take 1,000 mg by mouth every 8 (eight) hours as needed for moderate pain.    [provider]  aspirin 81 MG chewable tablet Chew 81 mg by mouth daily.    [provider]  atorvastatin (LIPITOR) 10 MG tablet Take 10 mg by mouth daily.    [provider]  benzocaine (ORAJEL MOUTH-AID) 20 % GEL Use as directed 1 application in the mouth or throat 4 (four) times daily as needed (gum pain).    [provider]  bismuth subsalicylate (PEPTO BISMOL) 262 MG/15ML suspension Take 30 mLs by mouth as needed for diarrhea or loose stools.     [provider]  carvedilol  (COREG) 6.25 MG tablet Take 6.25 mg by mouth 2 (two) times daily with a meal.    [provider]  cholecalciferol (VITAMIN D) 1000 UNITS tablet Take 1,000 Units by mouth daily.    [provider]  dextromethorphan (DELSYM) 30 MG/5ML liquid Take 10 mLs by mouth daily as needed for cough.     [provider]  donepezil (ARICEPT) 10 MG tablet Take 10 mg by mouth at bedtime.    [provider]  Fluticasone Furoate-Vilanterol 100-25 MCG/INH AEPB Inhale 1 puff into the lungs daily with breakfast.    [provider]  furosemide (LASIX) 80 MG tablet Take 1 tablet (80 mg total) by mouth daily. 07/20/16   Patrecia Pour, MD  glycopyrrolate (ROBINUL) 1 MG tablet Take 1 tablet (1 mg total) by mouth 2 (two) times daily. 07/20/16   Patrecia Pour, MD  guaiFENesin (MUCINEX) 600 MG 12 hr tablet Take 1 tablet (600 mg total) by mouth 2 (two) times daily. 06/16/16   Elgergawy, Silver Huguenin, MD  hydrocortisone cream (PREPARATION H HYDROCORTISONE) 1 % Apply 1 application topically 2 (two) times daily as needed. For hemmoroids.    [provider]  loperamide (IMODIUM) 2 MG capsule Take 1 capsule (2 mg total) by mouth 4 (four) times daily as needed for diarrhea or loose stools. 02/28/16   Orpah Greek, MD  losartan (COZAAR) 25 MG tablet Take 25 mg by mouth daily.    [provider]  mirabegron ER (MYRBETRIQ) 50 MG TB24 tablet Take 50 mg by mouth daily with breakfast.    [provider]  nitroGLYCERIN (NITROSTAT) 0.3 MG SL tablet Place 0.3 mg under the tongue every 5 (five) minutes as needed for chest pain.    [provider]  phenazopyridine (PYRIDIUM) 100 MG tablet Take 100 mg by mouth 2 (two) times daily. x3 days    [provider]  phenytoin (DILANTIN) 100 MG ER capsule Take 200 mg by mouth 2 (two) times daily.    [provider]  spironolactone (ALDACTONE) 25 MG tablet Take 25 mg by mouth every morning.    [provider]  tiotropium (SPIRIVA) 18 MCG inhalation capsule Place 18 mcg into inhaler and inhale daily.    [provider]    Family History Family History  Problem Relation Age of Onset  . Colon cancer Neg Hx     Social History Social History  Substance Use Topics  . Smoking status: Former Smoker  Packs/day: 0.00    Years: 50.00    Types: Cigarettes    Quit date: 08/22/2004  . Smokeless tobacco: Never Used  . Alcohol use No     Comment: "quit drinking 1980's"     Allergies   Red dye and Iodinated diagnostic agents   Review of Systems Review of Systems  Unable to perform ROS: Mental status change  All other systems reviewed and are negative.    Physical Exam Updated Vital Signs BP (!) 181/110   Pulse 91   Temp (!) 96.8 F (36 C) (Temporal)   Resp (!) 42   Ht 5\' 7"  (1.702 m)   Wt 64.9 kg (143 lb)   SpO2 91%   BMI 22.40 kg/m   Physical Exam  Constitutional:  Lethargic, intubated   HENT:  Head: Normocephalic.  Eyes: Pupils are equal, round, and reactive to light. EOM are normal.  Neck: Normal range of motion.  Cardiovascular: Normal rate and regular rhythm.   Pulmonary/Chest:  Intubated. Bilateral breath sounds   Abdominal: Soft. Bowel sounds are normal. He exhibits no distension. There is no tenderness.  Musculoskeletal: Normal range of motion.  Neurological:  Intubated. Lethargic. No spontaneous movement   Skin: Skin is warm.  Psychiatric:  Unable   Nursing note and vitals reviewed.    ED Treatments / Results  Labs (all labs ordered are listed, but only abnormal results are displayed) Labs Reviewed  COMPREHENSIVE METABOLIC PANEL - Abnormal; Notable for the following:       Result Value   Potassium 2.5 (*)    CO2 15 (*)    Glucose, Bld 288 (*)    Creatinine, Ser 1.42 (*)    Calcium 8.2 (*)    Total Protein 6.0 (*)    Albumin 3.4 (*)    AST 78 (*)    GFR calc non Af Amer 45 (*)    GFR calc Af Amer 52 (*)    Anion gap 16  (*)    All other components within normal limits  CBC WITH DIFFERENTIAL/PLATELET - Abnormal; Notable for the following:    WBC 13.7 (*)    RBC 3.60 (*)    Hemoglobin 11.1 (*)    HCT 34.9 (*)    Neutro Abs 10.3 (*)    All other components within normal limits  PHENYTOIN LEVEL, TOTAL - Abnormal; Notable for the following:    Phenytoin Lvl 5.4 (*)    All other components within normal limits  I-STAT CG4 LACTIC ACID, ED - Abnormal; Notable for the following:    Lactic Acid, Venous 8.56 (*)    All other components within normal limits  I-STAT VENOUS BLOOD GAS, ED - Abnormal; Notable for the following:    pH, Ven 7.135 (*)    Bicarbonate 16.5 (*)    Acid-base deficit 12.0 (*)    All other components within normal limits  CBG MONITORING, ED - Abnormal; Notable for the following:    Glucose-Capillary 150 (*)    All other components within normal limits  I-STAT ARTERIAL BLOOD GAS, ED - Abnormal; Notable for the following:    pH, Arterial 7.256 (*)    pO2, Arterial 128.0 (*)    Bicarbonate 19.5 (*)    Acid-base deficit 8.0 (*)    All other components within normal limits  CULTURE, BLOOD (ROUTINE X 2)  CULTURE, BLOOD (ROUTINE X 2)  URINALYSIS, ROUTINE W REFLEX MICROSCOPIC  BLOOD GAS, VENOUS  BLOOD GAS, ARTERIAL  TRIGLYCERIDES  I-STAT TROPONIN, ED  EKG  EKG Interpretation None       Radiology Dg Chest Port 1 View  Result Date: 04-Nov-2016 CLINICAL DATA:  Hypoxia EXAM: PORTABLE CHEST 1 VIEW COMPARISON:  July 20, 2016 FINDINGS: Endotracheal tube tip is 7.3 cm above the carina. Orogastric tube extends in the stomach or the tube loops on itself. The tip of the orogastric tube is in the distal esophagus. No pneumothorax. There is mild bibasilar atelectasis. There is also mild atelectasis in the right mid lung. No edema or consolidation. Heart is upper normal in size with pulmonary vascular within normal limits. There is aortic atherosclerosis. There are several displaced rib  fractures on the right. IMPRESSION: Tube positions as described without pneumothorax. Note that the tip of the orogastric tube is in the distal esophagus after the tube looped on itself in the proximal stomach. Areas of patchy atelectasis bilaterally. Displaced rib fractures on the right. Stable cardiac silhouette. There is aortic atherosclerosis. Aortic Atherosclerosis (ICD10-I70.0). Electronically Signed   By: Lowella Grip III M.D.   On: Nov 04, 2016 10:32   Dg Abd Portable 1 View  Result Date: 2016/11/04 CLINICAL DATA:  Orogastric tube placement EXAM: PORTABLE ABDOMEN - 1 VIEW COMPARISON:  None. FINDINGS: Orogastric tube extends in the stomach. The tube loops on itself with the tip in the distal esophagus. The side port is at the gastroesophageal junction after the loop. There is gastric dilatation with air. There is no bowel obstruction or free air evident. IMPRESSION: Orogastric tube loops on itself in the stomach with the tip in the distal esophagus. Advise advancing orogastric tube approximately 10 cm to hopefully advance the tube tip into the stomach. Advise obtaining abdominal image after tube advancement. Gastric dilatation noted. No overt bowel obstruction or free air evident. Electronically Signed   By: Lowella Grip III M.D.   On: 11/04/2016 10:33    Procedures Procedures (including critical care time)  Medications Ordered in ED Medications  sodium chloride 0.9 % bolus 1,000 mL (0 mLs Intravenous Stopped 11-04-16 1056)    And  sodium chloride 0.9 % bolus 1,000 mL (not administered)  vancomycin (VANCOCIN) IVPB 1000 mg/200 mL premix (1,000 mg Intravenous New Bag/Given 04-Nov-2016 1124)  aspirin suppository 300 mg (not administered)  0.9 %  sodium chloride infusion (1,000 mL/hr Intravenous New Bag/Given 11/04/16 1039)  norepinephrine (LEVOPHED) 4 mg in dextrose 5 % 250 mL (0.016 mg/mL) infusion (10 mcg/min Intravenous Rate/Dose Change 04-Nov-2016 1148)  fentaNYL (SUBLIMAZE) injection 50 mcg  (50 mcg Intravenous Given 04-Nov-2016 1000)  fentaNYL (SUBLIMAZE) injection 50 mcg (not administered)  midazolam (VERSED) injection 1 mg (2 mg Intravenous Given 11/04/2016 1000)  midazolam (VERSED) injection 1 mg (not administered)  EPINEPHrine (ADRENALIN) 1 mg (1 mg Intravenous Not Given November 04, 2016 1039)  ceFEPIme (MAXIPIME) 1 g in dextrose 5 % 50 mL IVPB (not administered)  vancomycin (VANCOCIN) IVPB 1000 mg/200 mL premix (not administered)  fentaNYL (SUBLIMAZE) injection 50 mcg (not administered)  fentaNYL (SUBLIMAZE) injection 50 mcg (not administered)  propofol (DIPRIVAN) 1000 MG/100ML infusion (not administered)  propofol (DIPRIVAN) 1000 MG/100ML infusion (not administered)  ceFEPIme (MAXIPIME) 2 g in dextrose 5 % 50 mL IVPB (0 g Intravenous Stopped 04-Nov-2016 1124)  sodium bicarbonate injection 50 mEq (50 mEq Intravenous Given 2016-11-04 1130)     Initial Impression / Assessment and Plan / ED Course  I have reviewed the triage vital signs and the nursing notes.  Pertinent labs & imaging results that were available during my care of the patient were reviewed by me  and considered in my medical decision making (see chart for details).     [name] is a 83 y.o. male here with choking episode then vfib arrest. Likely primary respiratory arrest. I called cardiology and Dr. Irish Lack saw patient and doesn't recommend urgent cath. Patient intubated by EMS despite having DNR on the chart. Will talk to proxy to clarify code status. He is hypotensive so I activated code sepsis and will start levophed, abx. I also called critical care about cooling patient and they recommend ice packs for now.   11 am I talked to step daughter, who is the proxy. She is upset about intubation. On the other hand, she doesn't want terminal extubation. He is made no code and will not escalate care.   12:21 PM Critical care will admit. BP 150s now. ABG showed pH 7.2 bicarb 20, nl CO2. CXR showed pneumonia. Given abx  already.    Final Clinical Impressions(s) / ED Diagnoses   Final diagnoses:  None    New Prescriptions New Prescriptions   No medications on file     Drenda Freeze, MD 2016/11/19 1223

## 2016-11-01 NOTE — ED Notes (Signed)
Family in consultation A, chaplin with family, md made aware.

## 2016-11-01 NOTE — ED Notes (Signed)
After repeat abd xray OG ube still appears coiled and was removed. This RN has attempted twice unsuccessfully to insert OG tube and report given to 91M RN.

## 2016-11-01 NOTE — ED Notes (Signed)
Report given to [facility] RN on 30M, RN states at this time bed assigned is unavailable but will call back to inform which bed I can bring pt to.

## 2016-11-01 NOTE — Progress Notes (Signed)
Patient transported to room 2M16 without any apparent complications.

## 2016-11-01 NOTE — ED Notes (Addendum)
Pt arrives by [facility] from [place] where pt had witnessed chocking event and pt received rescue efforts to dislodge food, some food did dislodge however when fire arrived by had collapsed and was pulseless, CPR was done for 20 minutes and 3mg  epi given. On [facility] arrival pt was in v fib and shocked once and had return of pulses. Pt arrives to ED intubated PTA gcs-3. Pt was given 5mg  versed PTA.

## 2016-11-01 DEATH — deceased

## 2016-11-04 LAB — CULTURE, BLOOD (ROUTINE X 2)
CULTURE: NO GROWTH
Culture: NO GROWTH
Special Requests: ADEQUATE
Special Requests: ADEQUATE

## 2016-12-02 NOTE — Death Summary Note (Signed)
PCCM death summary  Name: [name] MRN: [id number] DOB: 1932/07/20    ADMISSION DATE:  Nov 29, 2016 DATE OF DEATH: 2016/11/29   Final cause of death: Acute respiratory failure, respiratory arrest  Secondary causes of death: Acute aspiration event Cardiac arrest, ventricular fibrillation/tachycardia Coronary artery disease Ischemic cardiomyopathy Cardiogenic shock Acute renal insufficiency, ATN Hypokalemia Mild transaminitis History of dementia Protein calorie malnutrition   HISTORY OF PRESENT ILLNESS / hospital course:  83 year old male with mulitple medical problems as outlined below, significant for CAD, cardiomyopathy with EF 15%, and dementia. He resides in skilled nursing facility where he requires assistance with his ADLs. Stepdaughter (and HCPOA) is present and giving history. The AM of 11/30/22 he was at [facility] when it is suspected that he aspirated on his breakfast. He was given the heimlich maneuver and subsequently suffered a cardiac arrest. CPR was initiated and continued for about 20 mins prior to EMS arrival when pads were placed and he was found to be in VF. He was successfully defibrillated. Total downtime estimated at 25 mins. He was intubated in field. Hypotensive in ED requiring pressors. PCCM evaluated for stabilization and admission. On discussion with the patient's stepdaughter it was noted that he had been in overall declined for several months. He had voiced wishes to not be resuscitated and actually had DO NOT RESUSCITATE orders in place. Based on our discussions decision was made to transition him to comfort care. He was kept comfortable on mechanical ventilation and sedation was initiated while we waited for other family and friends to arrive. At that time care was withdrawn. He expired on November 29, 2016.    PAST MEDICAL HISTORY :   has a past medical history of Acute MI [facility]) (Dec 2010); Anemia; Angina; Blood transfusion; CAD (coronary artery disease); COPD (chronic  obstructive pulmonary disease) ([place]); DEMENTIA; High cholesterol; Hypertension; LV dysfunction; NSTEMI (non-ST elevated myocardial infarction) [facility]) (Nov 2011); Prostatic hypertrophy; Seizures ([place]) (~ 2006); Urethral stricture; Urinary catheter in place; and Vision impairment.  has a past surgical history that includes Tonsillectomy; Cataract extraction; Cardiac catheterization (02/2010); Coronary angioplasty with stent (03/2009); Cholecystectomy (05/30/11); and Cholecystectomy (05/30/2011). Prior to Admission medications   Medication Sig Start Date End Date Taking? Authorizing Provider  acetaminophen (TYLENOL) 500 MG tablet Take 1,000 mg by mouth every 8 (eight) hours as needed for moderate pain.    [provider]  aspirin 81 MG chewable tablet Chew 81 mg by mouth daily.    [provider]  atorvastatin (LIPITOR) 10 MG tablet Take 10 mg by mouth daily.    [provider]  benzocaine (ORAJEL MOUTH-AID) 20 % GEL Use as directed 1 application in the mouth or throat 4 (four) times daily as needed (gum pain).    [provider]  bismuth subsalicylate (PEPTO BISMOL) 262 MG/15ML suspension Take 30 mLs by mouth as needed for diarrhea or loose stools.     [provider]  carvedilol (COREG) 6.25 MG tablet Take 6.25 mg by mouth 2 (two) times daily with a meal.    [provider]  cholecalciferol (VITAMIN D) 1000 UNITS tablet Take 1,000 Units by mouth daily.    [provider]  dextromethorphan (DELSYM) 30 MG/5ML liquid Take 10 mLs by mouth daily as needed for cough.     [provider]  donepezil (ARICEPT) 10 MG tablet Take 10 mg by mouth at bedtime.    [provider]  Fluticasone Furoate-Vilanterol 100-25 MCG/INH AEPB Inhale 1 puff into the lungs  daily with breakfast.    [provider]  furosemide (LASIX) 80 MG tablet Take 1 tablet (80 mg total) by mouth daily. 07/20/16   Patrecia Pour, MD  glycopyrrolate (ROBINUL) 1 MG  tablet Take 1 tablet (1 mg total) by mouth 2 (two) times daily. 07/20/16   Patrecia Pour, MD  guaiFENesin (MUCINEX) 600 MG 12 hr tablet Take 1 tablet (600 mg total) by mouth 2 (two) times daily. 06/16/16   Elgergawy, Silver Huguenin, MD  hydrocortisone cream (PREPARATION H HYDROCORTISONE) 1 % Apply 1 application topically 2 (two) times daily as needed. For hemmoroids.    [provider]  loperamide (IMODIUM) 2 MG capsule Take 1 capsule (2 mg total) by mouth 4 (four) times daily as needed for diarrhea or loose stools. 02/28/16   Orpah Greek, MD  losartan (COZAAR) 25 MG tablet Take 25 mg by mouth daily.    [provider]  mirabegron ER (MYRBETRIQ) 50 MG TB24 tablet Take 50 mg by mouth daily with breakfast.    [provider]  nitroGLYCERIN (NITROSTAT) 0.3 MG SL tablet Place 0.3 mg under the tongue every 5 (five) minutes as needed for chest pain.    [provider]  phenazopyridine (PYRIDIUM) 100 MG tablet Take 100 mg by mouth 2 (two) times daily. x3 days    [provider]  phenytoin (DILANTIN) 100 MG ER capsule Take 200 mg by mouth 2 (two) times daily.    [provider]  spironolactone (ALDACTONE) 25 MG tablet Take 25 mg by mouth every morning.    [provider]  tiotropium (SPIRIVA) 18 MCG inhalation capsule Place 18 mcg into inhaler and inhale daily.    [provider]   Allergies  Allergen Reactions  . Red Dye Other (See Comments)    Per Mar.  . Iodinated Diagnostic Agents Rash    Pt given 50 mg Benadryl prior to Omnipaque injection, tolerated well.    FAMILY HISTORY:  family history is not on file. SOCIAL HISTORY:  reports that he quit smoking about 12 years ago. His smoking use included Cigarettes. He smoked 0.00 packs per day for 50.00 years. He has never used smokeless tobacco. He reports that he does not drink alcohol or use drugs.   Baltazar Apo, MD, PhD 11/08/2016, 11:22 AM [facility] [phone] or if no answer [phone]

## 2017-07-27 IMAGING — DX DG CHEST 1V PORT
1 series · 1 of 1 positions shown · non-contrast
Comparison: Chest radiograph May 17, 2016

CLINICAL DATA: Increasing shortness of breath, hypoxia beginning at
midnight. History of COPD.

EXAM:
PORTABLE CHEST 1 VIEW

[chest ap]
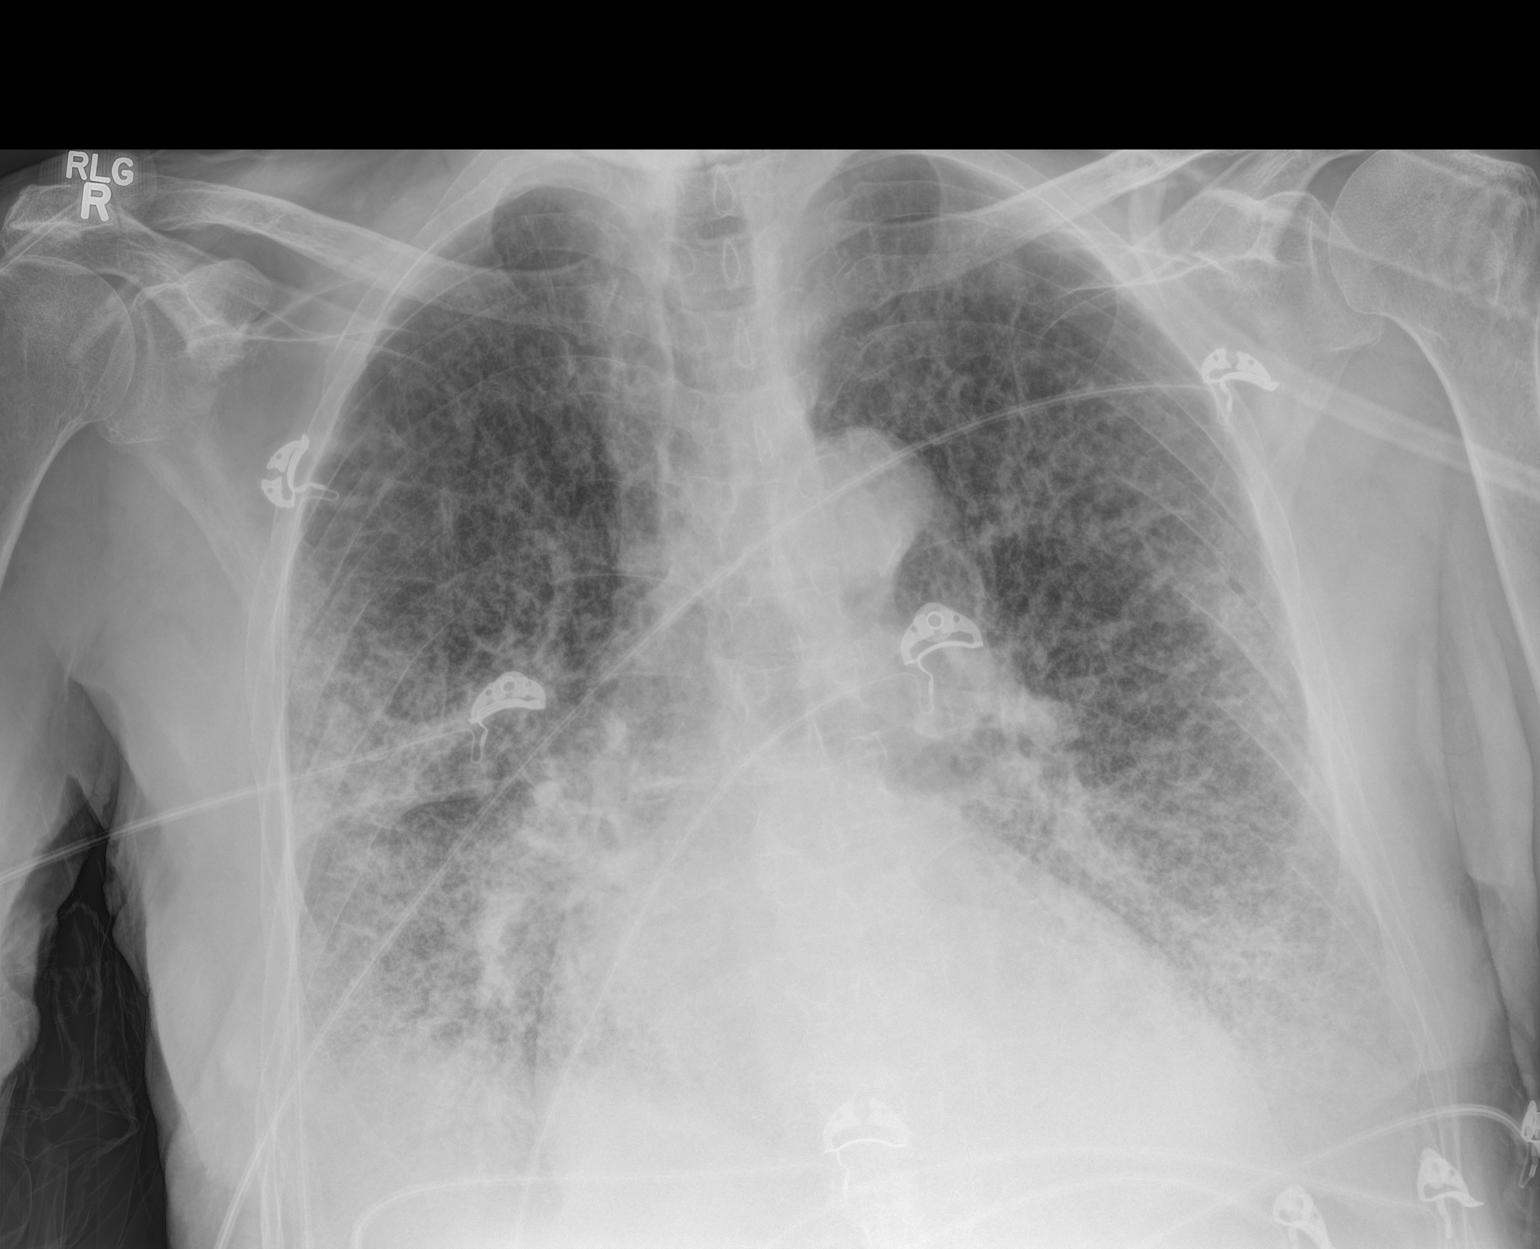

[1 of 1 positions shown; findings below may reference images not displayed]

FINDINGS: Cardiac silhouette is moderately enlarged. Tortuous calcified aorta.
Diffuse interstitial prominence, patchy bibasilar and RIGHT mid lung
zone airspace opacities and small pleural effusions. No
pneumothorax. Soft tissue planes and included osseous structures are
nonsuspicious.
IMPRESSION: Severe interstitial prominence concerning for atypical infection,
with bibasilar and RIGHT mid lung zone atelectasis versus focal
consolidation/pneumonia. Small pleural effusions.

Stable cardiomegaly and COPD.

## 2017-08-29 IMAGING — CR DG CHEST 2V
2 series · 2 of 2 positions shown · non-contrast
Comparison: Chest radiograph performed 06/14/2016

CLINICAL DATA: Acute onset of shortness of breath. Initial
encounter.

EXAM:
CHEST  2 VIEW

[w chest pa]
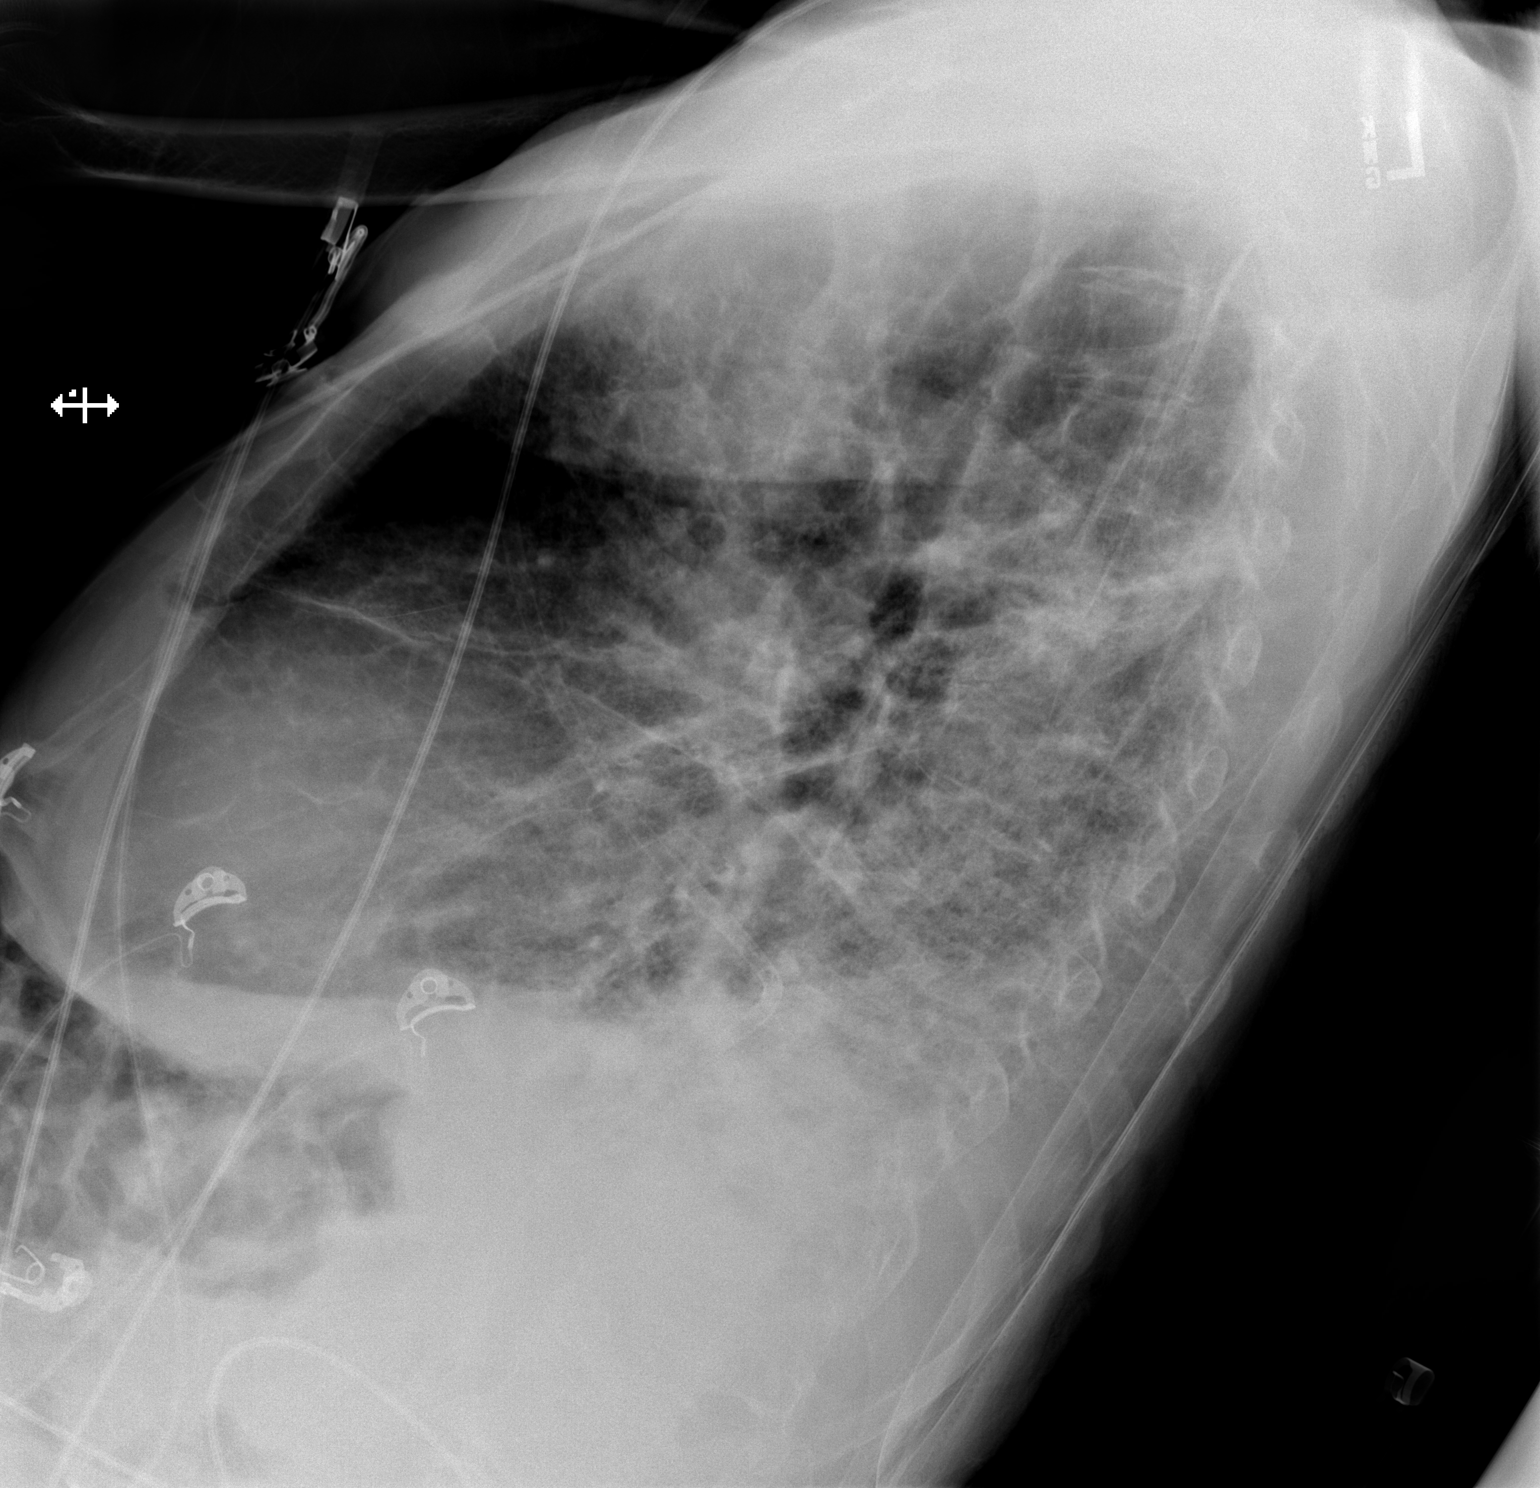

[x chest ap]
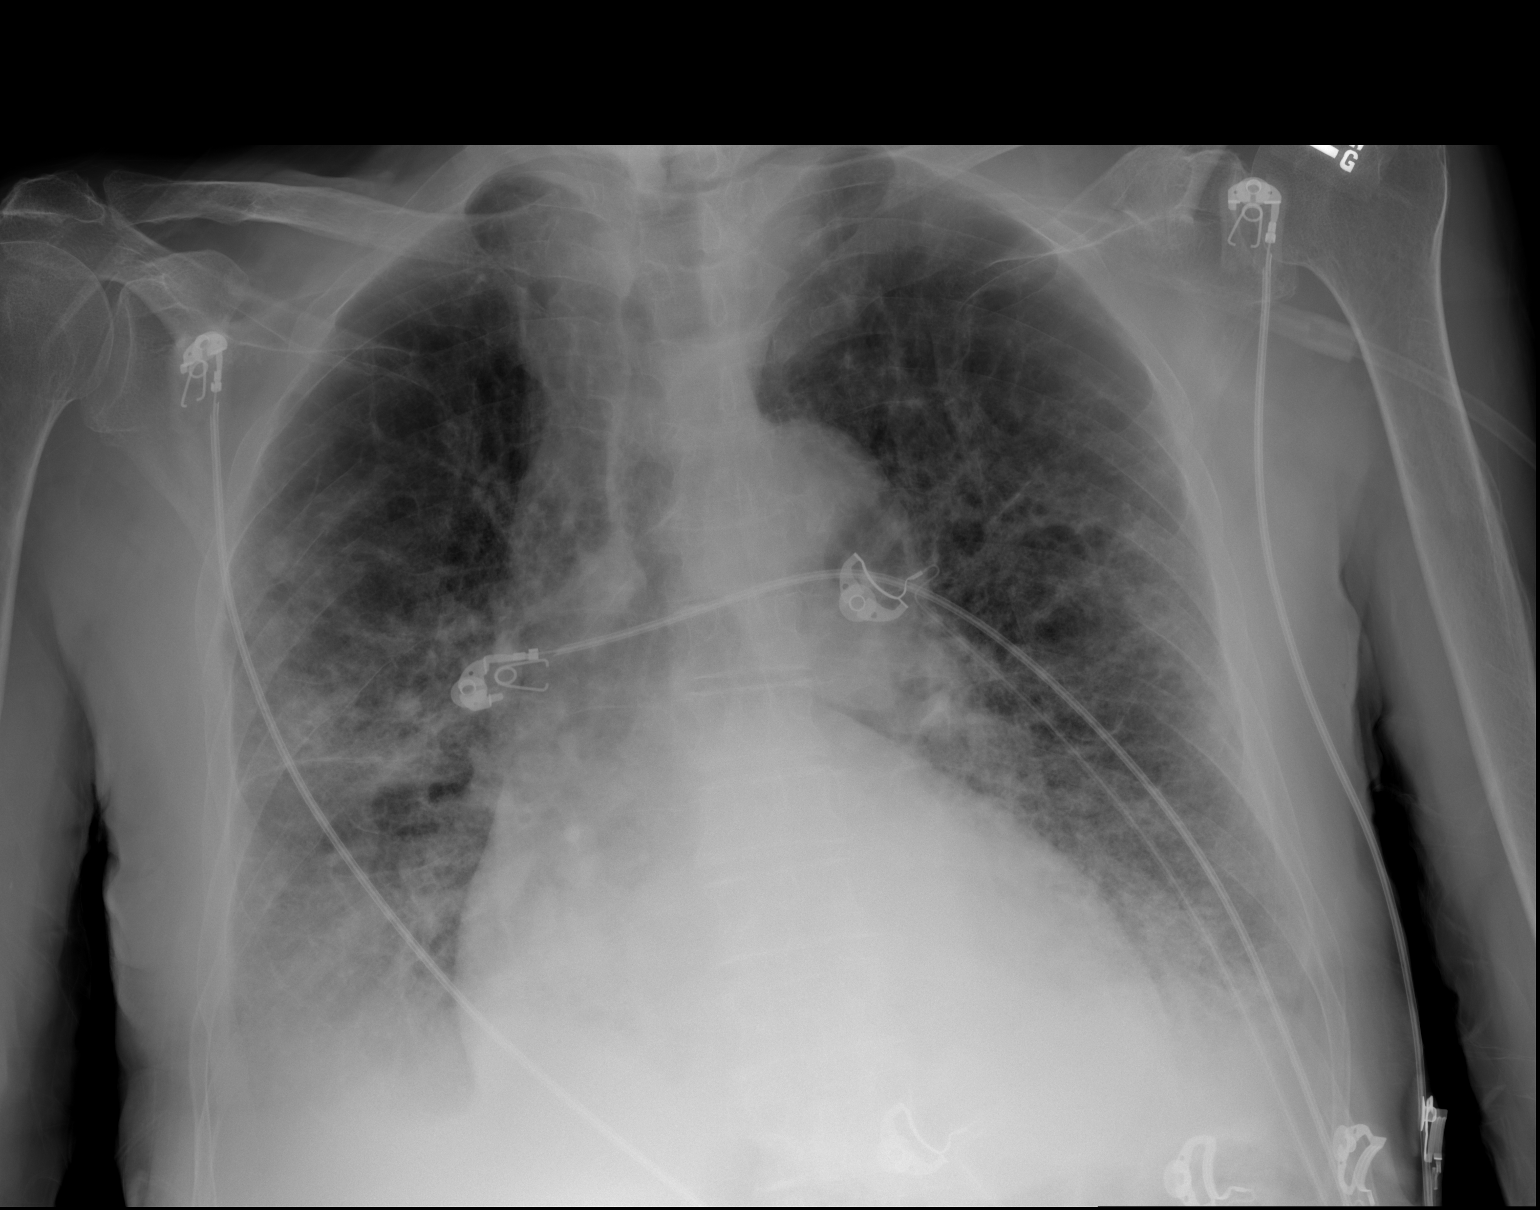

[2 of 2 positions shown; findings below may reference images not displayed]

FINDINGS: The lungs are well-aerated. Small bilateral pleural effusions noted.
Patchy bibasilar airspace opacities may reflect pulmonary edema or
pneumonia. Underlying vascular congestion is seen. No pneumothorax
is identified.

The heart is enlarged.  No acute osseous abnormalities are seen.
IMPRESSION: Small bilateral pleural effusions. Underlying vascular congestion
and cardiomegaly. Patchy bibasilar airspace opacities may reflect
pulmonary edema or pneumonia.

## 2017-09-02 IMAGING — CR DG CHEST 2V
2 series · 2 of 2 positions shown · non-contrast
Comparison: Chest x-ray of July 18, 2016

CLINICAL DATA: History of COPD, coronary artery disease, and
pulmonary edema, now with increased shortness of breath, productive
cough, and increased oxygen requirement.

EXAM:
CHEST  2 VIEW

[w chest pa]
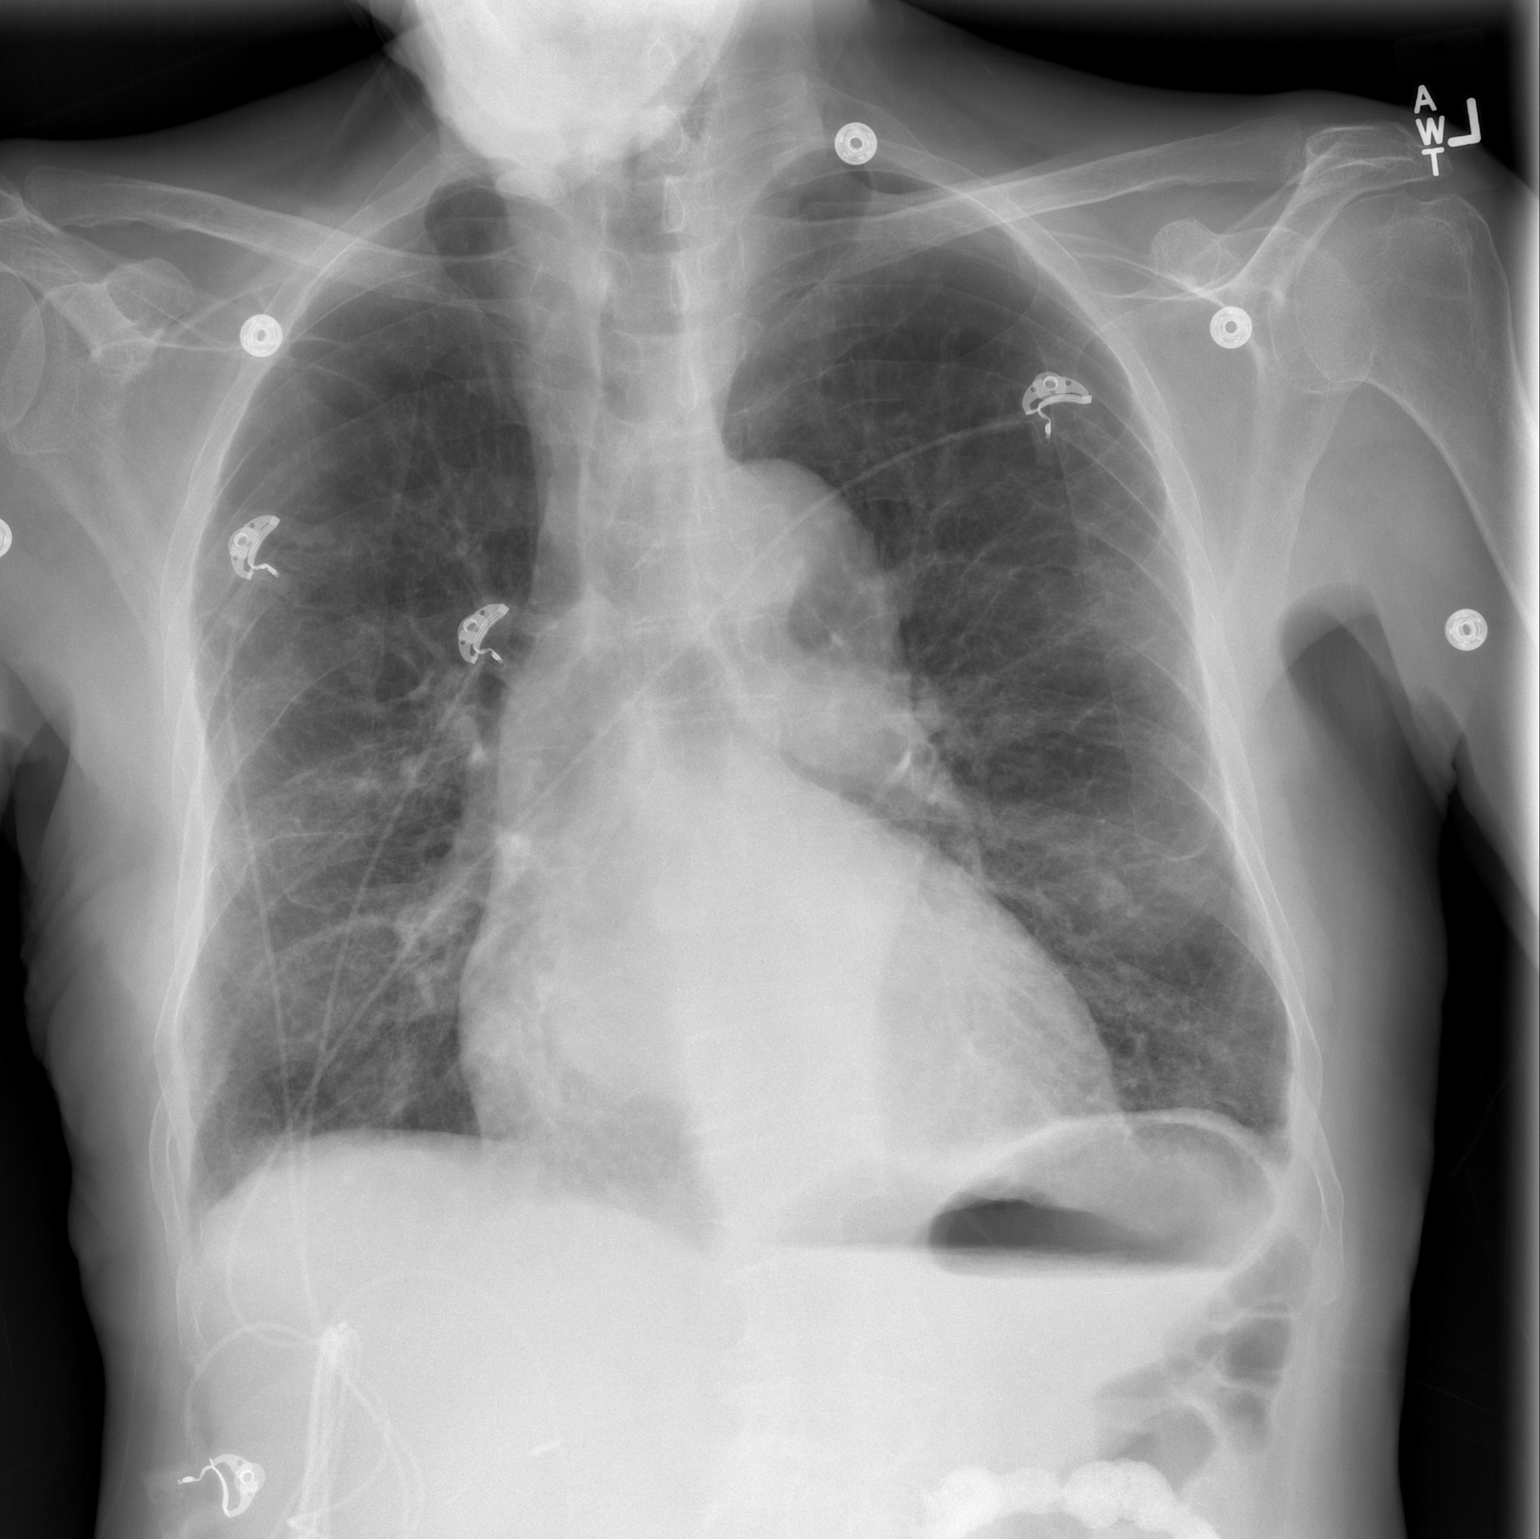

[w chest lat]
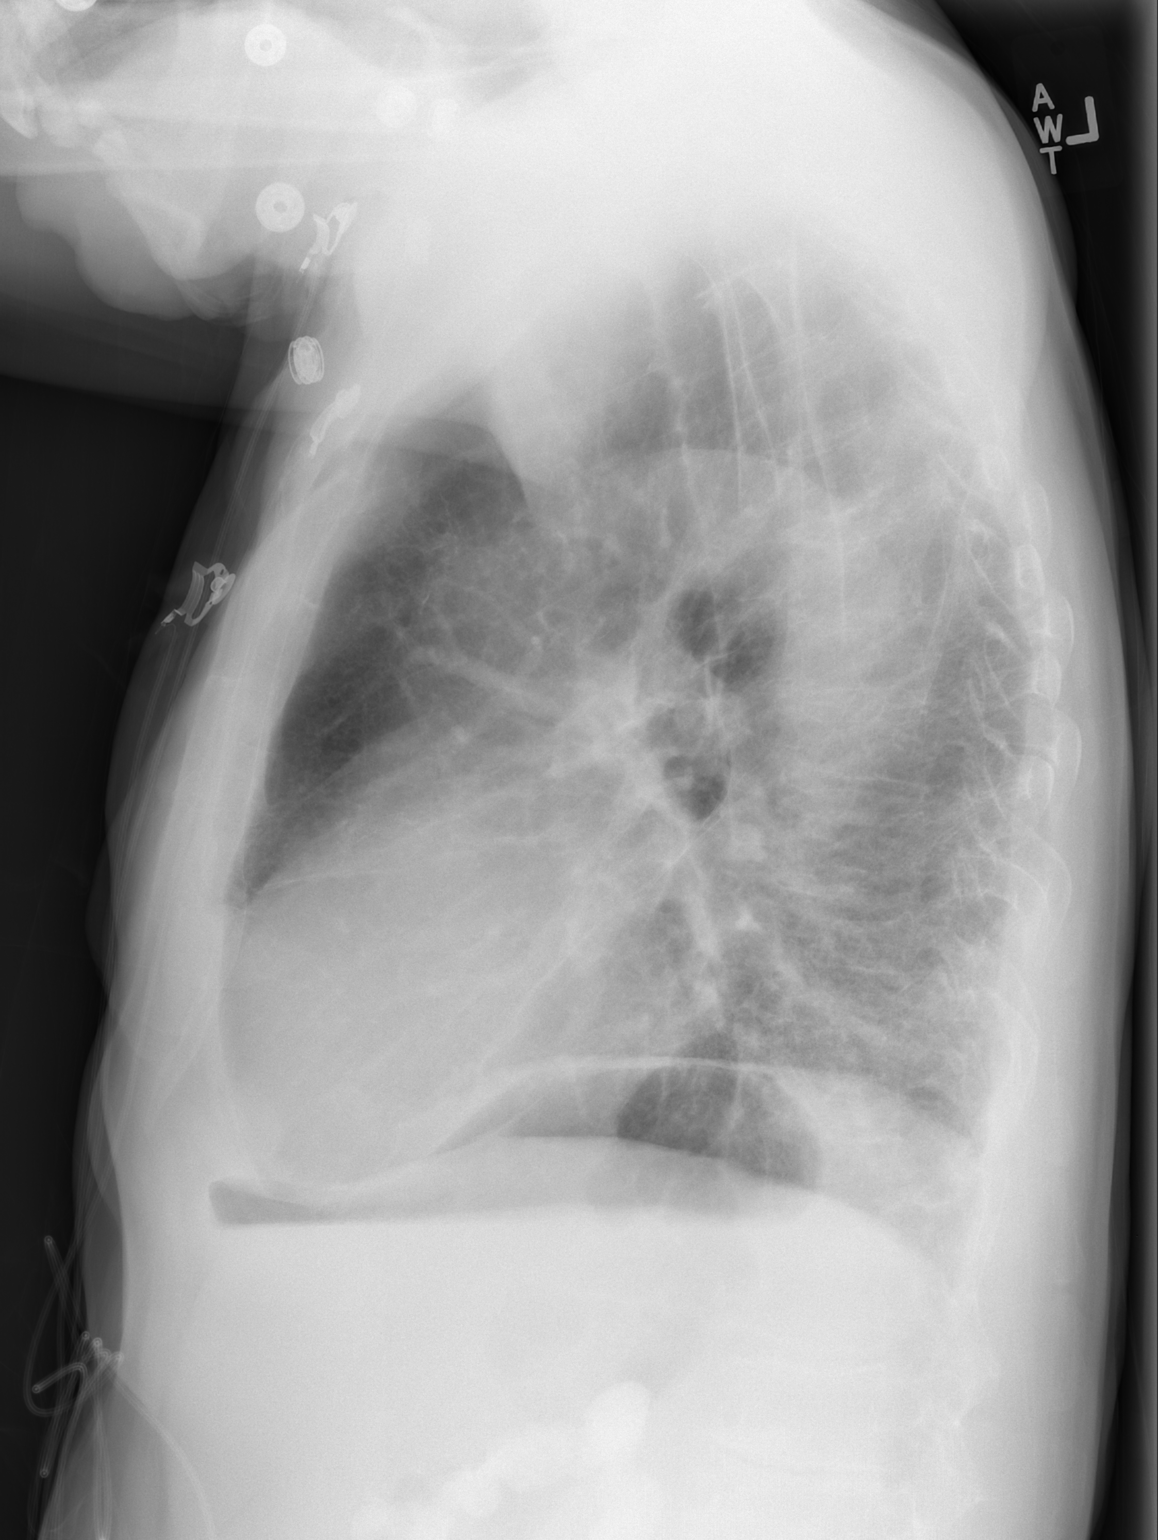

[2 of 2 positions shown; findings below may reference images not displayed]

FINDINGS: The lungs remain hyperinflated. The pulmonary interstitial markings
have improved. The pulmonary vascularity is less engorged. The heart
is mildly enlarged but less conspicuous today. There is
calcification in the wall of the thoracic aorta. The mediastinum is
normal in width. There is no significant pleural effusion.
IMPRESSION: COPD. Improved appearance of the pulmonary interstitium bilaterally
consistent with decreasing interstitial edema. Persistent mild
cardiomegaly with mild central pulmonary vascular congestion. No
pneumonia.

Thoracic aortic atherosclerosis.
